# Patient Record
Sex: Female | Born: 1970
Health system: Southern US, Community
[De-identification: ages and names within clinical notes are randomized; demographics above are authoritative.]

## PROBLEM LIST (undated history)

## (undated) DIAGNOSIS — M5431 Sciatica, right side: Secondary | ICD-10-CM

## (undated) DIAGNOSIS — E785 Hyperlipidemia, unspecified: Secondary | ICD-10-CM

## (undated) DIAGNOSIS — R51 Headache: Secondary | ICD-10-CM

## (undated) DIAGNOSIS — R519 Headache, unspecified: Secondary | ICD-10-CM

## (undated) DIAGNOSIS — R7303 Prediabetes: Secondary | ICD-10-CM

## (undated) DIAGNOSIS — E119 Type 2 diabetes mellitus without complications: Secondary | ICD-10-CM

## (undated) DIAGNOSIS — G8929 Other chronic pain: Secondary | ICD-10-CM

## (undated) DIAGNOSIS — K635 Polyp of colon: Secondary | ICD-10-CM

## (undated) DIAGNOSIS — M1990 Unspecified osteoarthritis, unspecified site: Secondary | ICD-10-CM

## (undated) DIAGNOSIS — G629 Polyneuropathy, unspecified: Secondary | ICD-10-CM

## (undated) DIAGNOSIS — M47816 Spondylosis without myelopathy or radiculopathy, lumbar region: Secondary | ICD-10-CM

## (undated) HISTORY — PX: TUBAL LIGATION: SHX77

## (undated) HISTORY — DX: Unspecified osteoarthritis, unspecified site: M19.90

## (undated) HISTORY — DX: Prediabetes: R73.03

## (undated) HISTORY — DX: Spondylosis without myelopathy or radiculopathy, lumbar region: M47.816

## (undated) HISTORY — DX: Hyperlipidemia, unspecified: E78.5

## (undated) HISTORY — DX: Sciatica, right side: M54.31

## (undated) HISTORY — DX: Type 2 diabetes mellitus without complications: E11.9

## (undated) HISTORY — DX: Polyp of colon: K63.5

## (undated) HISTORY — DX: Other chronic pain: G89.29

## (undated) HISTORY — DX: Headache: R51

## (undated) HISTORY — DX: Polyneuropathy, unspecified: G62.9

## (undated) HISTORY — DX: Headache, unspecified: R51.9

---

## 2001-01-02 ENCOUNTER — Emergency Department (HOSPITAL_COMMUNITY): Admission: EM | Admit: 2001-01-02 | Discharge: 2001-01-02 | Payer: Self-pay

## 2001-06-01 ENCOUNTER — Encounter: Payer: Self-pay | Admitting: Emergency Medicine

## 2001-06-01 ENCOUNTER — Emergency Department (HOSPITAL_COMMUNITY): Admission: EM | Admit: 2001-06-01 | Discharge: 2001-06-01 | Payer: Self-pay | Admitting: Emergency Medicine

## 2001-08-23 ENCOUNTER — Other Ambulatory Visit: Admission: RE | Admit: 2001-08-23 | Discharge: 2001-08-23 | Payer: Self-pay | Admitting: Internal Medicine

## 2002-04-04 ENCOUNTER — Emergency Department (HOSPITAL_COMMUNITY): Admission: EM | Admit: 2002-04-04 | Discharge: 2002-04-04 | Payer: Self-pay | Admitting: Emergency Medicine

## 2005-10-18 ENCOUNTER — Emergency Department (HOSPITAL_COMMUNITY): Admission: EM | Admit: 2005-10-18 | Discharge: 2005-10-18 | Payer: Self-pay | Admitting: Emergency Medicine

## 2005-10-20 ENCOUNTER — Emergency Department (HOSPITAL_COMMUNITY): Admission: EM | Admit: 2005-10-20 | Discharge: 2005-10-20 | Payer: Self-pay | Admitting: Family Medicine

## 2006-09-03 ENCOUNTER — Emergency Department (HOSPITAL_COMMUNITY): Admission: EM | Admit: 2006-09-03 | Discharge: 2006-09-03 | Payer: Self-pay | Admitting: Family Medicine

## 2007-02-09 ENCOUNTER — Emergency Department (HOSPITAL_COMMUNITY): Admission: EM | Admit: 2007-02-09 | Discharge: 2007-02-09 | Payer: Self-pay | Admitting: Emergency Medicine

## 2010-05-19 ENCOUNTER — Emergency Department (HOSPITAL_COMMUNITY): Admission: EM | Admit: 2010-05-19 | Discharge: 2010-05-19 | Payer: Self-pay | Admitting: Family Medicine

## 2010-10-09 LAB — POCT URINALYSIS DIPSTICK
Bilirubin Urine: NEGATIVE
Glucose, UA: NEGATIVE mg/dL
Ketones, ur: NEGATIVE mg/dL
Nitrite: NEGATIVE
Protein, ur: NEGATIVE mg/dL
Specific Gravity, Urine: 1.015 (ref 1.005–1.030)
Urobilinogen, UA: 1 mg/dL (ref 0.0–1.0)
pH: 7 (ref 5.0–8.0)

## 2011-05-12 LAB — POCT RAPID STREP A: Streptococcus, Group A Screen (Direct): NEGATIVE

## 2011-06-28 ENCOUNTER — Emergency Department (HOSPITAL_COMMUNITY)
Admission: EM | Admit: 2011-06-28 | Discharge: 2011-06-28 | Disposition: A | Discharge status: Home / Self Care | Payer: Self-pay | Attending: Emergency Medicine | Admitting: Emergency Medicine

## 2011-06-28 ENCOUNTER — Emergency Department (HOSPITAL_COMMUNITY): Payer: Self-pay

## 2011-06-28 ENCOUNTER — Encounter: Payer: Self-pay | Admitting: *Deleted

## 2011-06-28 DIAGNOSIS — G8929 Other chronic pain: Secondary | ICD-10-CM | POA: Insufficient documentation

## 2011-06-28 DIAGNOSIS — M545 Low back pain, unspecified: Secondary | ICD-10-CM | POA: Insufficient documentation

## 2011-06-28 DIAGNOSIS — M549 Dorsalgia, unspecified: Secondary | ICD-10-CM | POA: Insufficient documentation

## 2011-06-28 DIAGNOSIS — M5430 Sciatica, unspecified side: Secondary | ICD-10-CM | POA: Insufficient documentation

## 2011-06-28 MED ORDER — LIDOCAINE HCL 2 % IJ SOLN
INTRAMUSCULAR | Status: AC
  Filled 2011-06-28: qty 1

## 2011-06-28 MED ORDER — HYDROMORPHONE HCL PF 2 MG/ML IJ SOLN
INTRAMUSCULAR | Status: AC
  Filled 2011-06-28: qty 1

## 2011-06-28 MED ORDER — DIAZEPAM 5 MG PO TABS
5.0000 mg | ORAL_TABLET | Freq: Once | ORAL | Status: AC
  Administered 2011-06-28: 5 mg via ORAL
  Filled 2011-06-28: qty 1

## 2011-06-28 MED ORDER — DIAZEPAM 5 MG PO TABS: 5.0000 mg | ORAL_TABLET | Freq: Two times a day (BID) | ORAL | Status: AC

## 2011-06-28 MED ORDER — IBUPROFEN 800 MG PO TABS: 800.0000 mg | ORAL_TABLET | Freq: Three times a day (TID) | ORAL | Status: DC

## 2011-06-28 MED ORDER — KETOROLAC TROMETHAMINE 60 MG/2ML IM SOLN
60.0000 mg | Freq: Once | INTRAMUSCULAR | Status: AC
  Administered 2011-06-28: 60 mg via INTRAMUSCULAR
  Filled 2011-06-28: qty 2

## 2011-06-28 MED ORDER — OXYCODONE-ACETAMINOPHEN 5-325 MG PO TABS
1.0000 | ORAL_TABLET | Freq: Once | ORAL | Status: AC
  Administered 2011-06-28: 1 via ORAL
  Filled 2011-06-28: qty 1

## 2011-06-28 MED ORDER — IBUPROFEN 800 MG PO TABS: 800.0000 mg | ORAL_TABLET | Freq: Three times a day (TID) | ORAL | Status: AC

## 2011-06-28 MED ORDER — OXYCODONE-ACETAMINOPHEN 5-325 MG PO TABS: 1.0000 | ORAL_TABLET | ORAL | Status: AC | PRN

## 2011-06-28 NOTE — ED Notes (Signed)
Pt states she is having sharp low back pain radiating down to legs, difficult to sleep

## 2011-06-28 NOTE — ED Provider Notes (Signed)
History     CSN: [id number] Arrival date & time: 06/28/2011 11:00 AM   First MD Initiated Contact with Patient 06/28/11 1154      Chief Complaint  Patient presents with  . Back Pain    (Consider location/radiation/quality/duration/timing/severity/associated sxs/prior treatment) HPI Comments: She's having sharp low back pain that radiates down her right leg.  Is causing difficulty sleeping.  Patient has had back pain in the past but not like this before.  Patient does not have an orthopedic or PCP to followup with.  She denies urinary retention, bowel or bladder incontinence, and numbness of her lower extremities.  Patient is able to ambulate however it does induce pain.  Patient has no other complaints.  Patient is a 40 y.o. female presenting with back pain. The history is provided by the patient.  Back Pain  Pertinent negatives include no chest pain, no fever, no numbness, no headaches, no abdominal pain and no weakness.    History reviewed. No pertinent past medical history.  Past Surgical History  Procedure Date  . Tubal ligation     No family history on file.  History  Substance Use Topics  . Smoking status: Current Everyday Smoker  . Smokeless tobacco: Not on file  . Alcohol Use: No    OB History    Grav Para Term Preterm Abortions TAB SAB Ect Mult Living                  Review of Systems  Constitutional: Positive for activity change. Negative for fever, chills, fatigue and unexpected weight change.  HENT: Negative for neck pain and neck stiffness.   Eyes: Negative for visual disturbance.  Respiratory: Negative for shortness of breath.   Cardiovascular: Negative for chest pain and leg swelling.  Gastrointestinal: Negative for nausea, abdominal pain, constipation and rectal pain.  Genitourinary: Negative for urgency and difficulty urinating.       Patient denies bowel and bladder incontinence.  Musculoskeletal: Positive for back pain and gait problem. Negative  for myalgias, joint swelling and arthralgias.  Neurological: Negative for weakness, numbness and headaches.  All other systems reviewed and are negative.    Allergies  Review of patient's allergies indicates no known allergies.  Home Medications   Current Outpatient Rx  Name Route Sig Dispense Refill  . DIPHENHYDRAMINE-ACETAMINOPHEN 12.5-500 MG PO TABS Oral Take 1 tablet by mouth at bedtime as needed. For sleep     . OXYCODONE-ACETAMINOPHEN 7.5-325 MG PO TABS Oral Take 1 tablet by mouth every 4 (four) hours as needed. For pain       BP 134/96  Pulse 64  Temp(Src) 97.8 F (36.6 C) (Oral)  Resp 18  SpO2 100%  Physical Exam  Constitutional: She is oriented to person, place, and time. She appears well-developed and well-nourished. No distress.  HENT:  Head: Normocephalic and atraumatic.  Eyes: Conjunctivae and EOM are normal. Pupils are equal, round, and reactive to light. No scleral icterus.  Neck: Normal range of motion and full passive range of motion without pain. Neck supple. No tracheal tenderness, no spinous process tenderness and no muscular tenderness present. Carotid bruit is not present. No Brudzinski's sign noted. No mass and no thyromegaly present.  Cardiovascular: Normal rate, regular rhythm and intact distal pulses.  Exam reveals no gallop and no friction rub.   No murmur heard. Pulmonary/Chest: Effort normal and breath sounds normal. No stridor. No respiratory distress. She has no wheezes. She has no rales. She exhibits no tenderness.  Abdominal:  Soft. Bowel sounds are normal.  Musculoskeletal:       Cervical back: She exhibits normal range of motion, no tenderness, no bony tenderness and no pain.       Thoracic back: She exhibits no tenderness, no bony tenderness and no pain.       Lumbar back: She exhibits tenderness, bony tenderness and pain. She exhibits no spasm and normal pulse.       Right foot: She exhibits no swelling.       Left foot: She exhibits no  swelling.       Pt has increased pain w ROM of lumbar spine. Pain w ambulation.   Neurological: She is alert and oriented to person, place, and time. She has normal strength and normal reflexes. No cranial nerve deficit or sensory deficit.  Skin: Skin is warm and dry. No rash noted. She is not diaphoretic. No erythema. No pallor.  Psychiatric: She has a normal mood and affect.    ED Course  Procedures (including critical care time)  Labs Reviewed - No data to display No results found.   No diagnosis found. X-ray results discussed with the patient.  Pain was improved with Toradol and Valium.  Patient will be discharged with instructions for followup with orthopedics if symptoms progress or worsen and to be given pain management.   MDM  Sciatica Chronic Back pain         [place], [place] 06/28/11 1526

## 2011-06-28 NOTE — ED Provider Notes (Signed)
Medical screening examination/treatment/procedure(s) were performed by non-physician practitioner and as supervising physician I was immediately available for consultation/collaboration.  Christiann Hagerty P Catherina Pates, MD 06/28/11 1544 

## 2014-05-03 ENCOUNTER — Ambulatory Visit (INDEPENDENT_AMBULATORY_CARE_PROVIDER_SITE_OTHER): Payer: Self-pay | Admitting: Internal Medicine

## 2014-05-03 ENCOUNTER — Ambulatory Visit: Payer: Self-pay

## 2014-05-03 ENCOUNTER — Encounter: Payer: Self-pay | Admitting: Internal Medicine

## 2014-05-03 VITALS — BP 143/87 | HR 61 | Temp 98.0°F | Ht 69.0 in | Wt 227.2 lb

## 2014-05-03 DIAGNOSIS — Z720 Tobacco use: Secondary | ICD-10-CM

## 2014-05-03 DIAGNOSIS — Z23 Encounter for immunization: Secondary | ICD-10-CM

## 2014-05-03 DIAGNOSIS — R030 Elevated blood-pressure reading, without diagnosis of hypertension: Secondary | ICD-10-CM

## 2014-05-03 DIAGNOSIS — R51 Headache: Secondary | ICD-10-CM

## 2014-05-03 DIAGNOSIS — G8929 Other chronic pain: Secondary | ICD-10-CM

## 2014-05-03 DIAGNOSIS — Z1239 Encounter for other screening for malignant neoplasm of breast: Secondary | ICD-10-CM

## 2014-05-03 DIAGNOSIS — Z0000 Encounter for general adult medical examination without abnormal findings: Secondary | ICD-10-CM

## 2014-05-03 DIAGNOSIS — IMO0001 Reserved for inherently not codable concepts without codable children: Secondary | ICD-10-CM

## 2014-05-03 DIAGNOSIS — I10 Essential (primary) hypertension: Secondary | ICD-10-CM

## 2014-05-03 DIAGNOSIS — M47816 Spondylosis without myelopathy or radiculopathy, lumbar region: Secondary | ICD-10-CM

## 2014-05-03 LAB — COMPLETE METABOLIC PANEL WITH GFR
ALBUMIN: 4.3 g/dL (ref 3.5–5.2)
ALT: 12 U/L (ref 0–35)
AST: 12 U/L (ref 0–37)
Alkaline Phosphatase: 64 U/L (ref 39–117)
BUN: 5 mg/dL — AB (ref 6–23)
CALCIUM: 9.3 mg/dL (ref 8.4–10.5)
CHLORIDE: 108 meq/L (ref 96–112)
CO2: 23 mEq/L (ref 19–32)
CREATININE: 0.69 mg/dL (ref 0.50–1.10)
GLUCOSE: 102 mg/dL — AB (ref 70–99)
POTASSIUM: 4.2 meq/L (ref 3.5–5.3)
Sodium: 139 mEq/L (ref 135–145)
Total Bilirubin: 0.8 mg/dL (ref 0.2–1.2)
Total Protein: 6.7 g/dL (ref 6.0–8.3)

## 2014-05-03 LAB — CBC WITH DIFFERENTIAL/PLATELET
BASOS ABS: 0.1 10*3/uL (ref 0.0–0.1)
Basophils Relative: 1 % (ref 0–1)
EOS PCT: 2 % (ref 0–5)
Eosinophils Absolute: 0.2 10*3/uL (ref 0.0–0.7)
HCT: 37 % (ref 36.0–46.0)
Hemoglobin: 12.3 g/dL (ref 12.0–15.0)
LYMPHS ABS: 3.7 10*3/uL (ref 0.7–4.0)
LYMPHS PCT: 39 % (ref 12–46)
MCH: 30.4 pg (ref 26.0–34.0)
MCHC: 33.2 g/dL (ref 30.0–36.0)
MCV: 91.4 fL (ref 78.0–100.0)
Monocytes Absolute: 0.5 10*3/uL (ref 0.1–1.0)
Monocytes Relative: 5 % (ref 3–12)
NEUTROS ABS: 5 10*3/uL (ref 1.7–7.7)
NEUTROS PCT: 53 % (ref 43–77)
PLATELETS: 385 10*3/uL (ref 150–400)
RBC: 4.05 MIL/uL (ref 3.87–5.11)
RDW: 14.2 % (ref 11.5–15.5)
WBC: 9.5 10*3/uL (ref 4.0–10.5)

## 2014-05-03 NOTE — Patient Instructions (Signed)
-  Will check your blood work today -Please schedule a follow-up appt for pap smear  -Will order a mammogram and call you with appt date and time -Pleasure meeting you!

## 2014-05-04 ENCOUNTER — Encounter: Payer: Self-pay | Admitting: Internal Medicine

## 2014-05-04 DIAGNOSIS — G43009 Migraine without aura, not intractable, without status migrainosus: Secondary | ICD-10-CM | POA: Insufficient documentation

## 2014-05-04 DIAGNOSIS — F17201 Nicotine dependence, unspecified, in remission: Secondary | ICD-10-CM | POA: Insufficient documentation

## 2014-05-04 DIAGNOSIS — R030 Elevated blood-pressure reading, without diagnosis of hypertension: Secondary | ICD-10-CM

## 2014-05-04 DIAGNOSIS — IMO0001 Reserved for inherently not codable concepts without codable children: Secondary | ICD-10-CM | POA: Insufficient documentation

## 2014-05-04 DIAGNOSIS — Z720 Tobacco use: Secondary | ICD-10-CM | POA: Insufficient documentation

## 2014-05-04 DIAGNOSIS — Z0000 Encounter for general adult medical examination without abnormal findings: Secondary | ICD-10-CM | POA: Insufficient documentation

## 2014-05-04 DIAGNOSIS — M5431 Sciatica, right side: Secondary | ICD-10-CM | POA: Insufficient documentation

## 2014-05-04 DIAGNOSIS — M47816 Spondylosis without myelopathy or radiculopathy, lumbar region: Secondary | ICD-10-CM | POA: Insufficient documentation

## 2014-05-04 HISTORY — DX: Sciatica, right side: M54.31

## 2014-05-04 MED ORDER — ACETAMINOPHEN 325 MG PO TABS: 650.0000 mg | ORAL_TABLET | Freq: Four times a day (QID) | ORAL | Status: DC | PRN

## 2014-05-04 NOTE — Assessment & Plan Note (Addendum)
Assessment: Pt with frequent bilaterally located headaches with no disabling symptoms most likely due to chronic tension type headache.    Plan:  -Pt instructed to take OTC acetaminophen, NSAID's, or aspirin for pain -Obtain CMP and CBC w/ diff ---> normal -Continue to monitor for alarm symptoms

## 2014-05-04 NOTE — Progress Notes (Signed)
Patient ID: Maureen Reyes, female   DOB: May 30, 1971, 43 y.o.   MRN: [id number]    Subjective:   Patient ID: Maureen Reyes female   DOB: 1971-03-23 43 y.o.   MRN: [id number]  HPI: [name] is a 43 y.o. pleasant woman with past medical history of chronic low back pain due to lumbar facet arthropathy and right sided sciatica, chronic headaches, and tobacco use who presents to establish care to our clinic.   She reports having chronic low back pain after a television fell on her lower back approximately 3 years ago. She was seen in the ED at that time and xray imaging revealed lower lumbar facet arthropathy. She reports chronic weakness in both legs with paraesthesias of her right LE and was diagnosed with right sided sciatica. She denies bladder/bowel incontinence or weakness worse than baseline. She denies recent fall, injury, or trauma. She takes tylenol as needed for pain which is not routinely.    She has history of chronic bilateral located headaches for years that have been occuring recently weekly and will last a few hours to days. She describes them as bandlike located across her forehead with occasional throbbing.  She reports having a knot like area on the left side of her head that has been present for years but recently been more tender to touch. She reports mild photophobia but denies aura, vision change from baseline chronic blurry vision, nausea/vomiting, or disabling symptoms. She denies personal or family history of migraine headaches.  She reports she follows a healthy diet and tries to exercise regularly. There is family history of Type II DM in her parents and grandparents. She has chronic blurry vision for which she wears glasses for. She denies polyuria, polyphagia, or polyuria.   She has never had a screening mammography and is overdue for a pap smear.  She smokes 0.5 pack per day and would like to quit. She has occasional dyspnea and dry cough but denies wheezing.  She has never had pulmonary function testing in the past and is not on any inhalers.       Past Medical History  Diagnosis Date  . Lumbar facet arthropathy   . Chronic headaches   . Sciatica of right side    No current outpatient prescriptions on file.   No current facility-administered medications for this visit.   Family History  Problem Relation Age of Onset  . Diabetes Mellitus II Mother   . Diabetes Mellitus II Father    History   Social History  . Marital Status: Single    Spouse Name: N/A    Number of Children: N/A  . Years of Education: N/A   Social History Main Topics  . Smoking status: Current Every Day Smoker -- 20 years  . Smokeless tobacco: None     Comment: 1/2 PPD  . Alcohol Use: No  . Drug Use: No  . Sexual Activity: None   Other Topics Concern  . None   Social History Narrative  . None   Review of Systems: Review of Systems  Constitutional: Negative for fever and chills.  HENT: Negative for congestion and sore throat.   Eyes: Positive for blurred vision (chronic, wears glasses).  Respiratory: Positive for cough and shortness of breath (occasional). Negative for wheezing.   Cardiovascular: Negative for chest pain, palpitations, orthopnea and leg swelling.  Gastrointestinal: Negative for nausea, vomiting, abdominal pain, diarrhea, constipation and blood in stool.  Genitourinary: Negative for dysuria, urgency, frequency and hematuria.  Musculoskeletal: Positive for back pain (chronic low back ). Negative for falls, joint pain, myalgias and neck pain.  Skin: Negative for rash.  Neurological: Positive for sensory change (chronic in right LE), focal weakness (chronic in b/l LE) and headaches (chronic, tension type).  Endo/Heme/Allergies: Negative for polydipsia.  Psychiatric/Behavioral: Positive for substance abuse (tobacco).    Objective:  Physical Exam: Filed Vitals:   05/03/14 1111  BP: 143/87  Pulse: 61  Temp: 98 F (36.7 C)  TempSrc:  Oral  Height: 5\' 9"  (1.753 m)  Weight: 227 lb 3.2 oz (103.057 kg)  SpO2: 100%    Physical Exam  Constitutional: She is oriented to person, place, and time. She appears well-developed and well-nourished. No distress.  HENT:  Head: Normocephalic and atraumatic.  Right Ear: External ear normal.  Left Ear: External ear normal.  Nose: Nose normal.  Mouth/Throat: Oropharynx is clear and moist. No oropharyngeal exudate.  Eyes: Conjunctivae and EOM are normal. Pupils are equal, round, and reactive to light. Right eye exhibits no discharge. Left eye exhibits no discharge. No scleral icterus.  Neck: Normal range of motion. Neck supple.  Cardiovascular: Normal rate, regular rhythm and normal heart sounds.   Pulmonary/Chest: Effort normal and breath sounds normal. No respiratory distress. She has no wheezes. She has no rales.  Abdominal: Soft. Bowel sounds are normal. She exhibits no distension. There is no tenderness. There is no rebound and no guarding.  Musculoskeletal: Normal range of motion. She exhibits no edema and no tenderness.  Neurological: She is alert and oriented to person, place, and time.  Normal 5/5 muscle strength throughout. Decreased sensation of light touch of right LE. Straight leg test negative bilaterally.   Skin: Skin is warm and dry. No rash noted. She is not diaphoretic. No erythema. No pallor.  Psychiatric: She has a normal mood and affect. Her behavior is normal. Judgment and thought content normal.    Assessment & Plan:   Please see problem list for problem-based assessment and plan

## 2014-05-04 NOTE — Assessment & Plan Note (Addendum)
Assessment: Pt with chronic low back pain with evidence of lumbar facet arthropathy on xray imaging in 2012 and right sided sciatica who presents with no alarm symptoms.   Plan:  -Consider referral to sports medicine at next visit for further management and workup including possible imaging (MRI), physical therapy, and corticosteroid injection therapy  -Pt instructed to take OTC acetaminophen 650 mg Q 6 hr PRN pain

## 2014-05-04 NOTE — Assessment & Plan Note (Addendum)
Assessment: Pt is 0.5 ppd cigarette smoker for past 20 years who presents with plans to quit smoking.  Plan:  -Pt aware of tobacco cessation resources and counseled on cessation -Monitor cough, if chronic consider chest xray and pulmonary function testing

## 2014-05-04 NOTE — Assessment & Plan Note (Signed)
-  Pt received annual influenza vaccination today on 05/03/14.  -Order placed for screening mammography due to age>40.  -Pt to have pap smear testing at next visit.

## 2014-05-04 NOTE — Assessment & Plan Note (Signed)
Assessment: Pt with no past history of hypertension who presents with 1st recording of elevated blood pressure of 143/87.   Plan:  -BP 143/87 near goal <140/90 -Continue to monitor, if continues to be elevated consider starting HCTZ 12.5 mg daily -Obtain CMP ---> normal

## 2014-05-09 NOTE — Progress Notes (Signed)
INTERNAL MEDICINE TEACHING ATTENDING ADDENDUM - Chessie Neuharth, MD: I reviewed and discussed at the time of visit with the resident Dr. Rabbani, the patient's medical history, physical examination, diagnosis and results of pertinent tests and treatment and I agree with the patient's care as documented.  

## 2014-05-11 ENCOUNTER — Ambulatory Visit: Payer: Self-pay

## 2014-05-15 ENCOUNTER — Ambulatory Visit (INDEPENDENT_AMBULATORY_CARE_PROVIDER_SITE_OTHER): Payer: Self-pay | Admitting: Internal Medicine

## 2014-05-15 ENCOUNTER — Encounter: Payer: Self-pay | Admitting: Internal Medicine

## 2014-05-15 VITALS — BP 139/89 | HR 52 | Temp 97.7°F | Ht 69.0 in | Wt 228.3 lb

## 2014-05-15 DIAGNOSIS — R030 Elevated blood-pressure reading, without diagnosis of hypertension: Secondary | ICD-10-CM

## 2014-05-15 DIAGNOSIS — Z124 Encounter for screening for malignant neoplasm of cervix: Secondary | ICD-10-CM

## 2014-05-15 DIAGNOSIS — IMO0001 Reserved for inherently not codable concepts without codable children: Secondary | ICD-10-CM

## 2014-05-15 DIAGNOSIS — N889 Noninflammatory disorder of cervix uteri, unspecified: Secondary | ICD-10-CM

## 2014-05-15 NOTE — Progress Notes (Signed)
   Subjective:   Patient ID: Maureen Reyes female   DOB: 07-18-1971 43 y.o.   MRN: [id number]  HPI: [name] is a 43 y.o. female presenting to opc today for a pap smear.  She reports the last one was several years ago, maybe at [facility] hospital. No report on EPIC that I could locate at this time. Denies history of abnormal pap smears or immediate family history of breat or cervical cancer.  She is still sexually active but denies any pain or bleeding during intercourse, no abnormal vaginal discharge, hematuria, abdominal pain. LMP last month 04/21/14.    Elevated BP: initially 156/91 but improved to 139/89 on repeat.   Past Medical History  Diagnosis Date  . Lumbar facet arthropathy   . Chronic headaches   . Sciatica of right side    Current Outpatient Prescriptions  Medication Sig Dispense Refill  . acetaminophen (TYLENOL) 325 MG tablet Take 2 tablets (650 mg total) by mouth every 6 (six) hours as needed.       No current facility-administered medications for this visit.   Family History  Problem Relation Age of Onset  . Diabetes Mellitus II Mother   . Diabetes Mellitus II Father    History   Social History  . Marital Status: Single    Spouse Name: N/A    Number of Children: N/A  . Years of Education: N/A   Social History Main Topics  . Smoking status: Current Every Day Smoker -- 20 years  . Smokeless tobacco: None     Comment: 1/2 PPD  . Alcohol Use: No  . Drug Use: No  . Sexual Activity: None   Other Topics Concern  . None   Social History Narrative  . None   Review of Systems:  Constitutional:  Denies fever, chills  Respiratory:  Denies SOB  Cardiovascular:  Denies chest pain  Gastrointestinal:  Denies nausea, vomiting, abdominal pain  Genitourinary:  Denies dysuria, hematuria  Skin:  Denies pallor, rash and wound.   Neurological:  Headaches.    Objective:  Physical Exam: Filed Vitals:   05/15/14 0953 05/15/14 1429  BP: 156/91 139/89    Pulse: 45 52  Temp: 97.7 F (36.5 C)   TempSrc: Oral   Height: 5\' 9"  (1.753 m)   Weight: 228 lb 4.8 oz (103.556 kg)   SpO2: 100%    Vitals reviewed. General: lying on examination table, NAD HEENT: EOMI Cardiac: RRR Pulm: clear to auscultation bilaterally Abd: soft, nontender, BS present GYN: white plaque noted on cervix, not scrapeable, bleeding after cervical scraping noted, no cervical motion tenderness Ext: moving all 4 extremities Neuro: alert and oriented X3  Assessment & Plan:  Discussed with Dr. Si Gaul done today--GYN referral, awaiting cytology and HPV results

## 2014-05-15 NOTE — Assessment & Plan Note (Signed)
Elevated again today initially to SBP 156 but improved on repeat to 139/89.    -recommended life style modifications, decrease salt intake, adjust diet, and increase exercise -recheck in one month and consider treatment if remains elevated with formal diagnosis

## 2014-05-15 NOTE — Patient Instructions (Addendum)
Please get your mammogram done and we will also let you know of the results of your pap smear done today  Please cut back on the salt in your diet and PLEASE stop smoking if possible. Consider calling the quit line 1800quit now  General Instructions:  Please bring your medicines with you each time you come to clinic.  Medicines may include prescription medications, over-the-counter medications, herbal remedies, eye drops, vitamins, or other pills.  Progress Toward Treatment Goals:  No flowsheet data found.  Self Care Goals & Plans:  Self Care Goal 05/15/2014  Manage my medications take my medicines as prescribed; bring my medications to every visit; refill my medications on time  Eat healthy foods eat more vegetables; eat foods that are low in salt; eat baked foods instead of fried foods  Be physically active -  Stop smoking (No Data)    No flowsheet data found.   Care Management & Community Referrals:  No flowsheet data found.

## 2014-05-15 NOTE — Assessment & Plan Note (Signed)
Annual screening. No prior records on epic but claims last one at least 2 years ago maybe at [facility] hospital.    White plaque noted on cervix on examination with bleeding on scraping.   -awaiting cytology with HPV -GYN referral given examination findings

## 2014-05-16 LAB — CYTOLOGY - PAP

## 2014-05-16 NOTE — Progress Notes (Signed)
Case discussed with Dr. Eula Fried soon after the resident saw the patient. We reviewed the resident's history and exam and pertinent patient test results. I agree with the assessment, diagnosis, and plan of care documented in the resident's note.  Given cervical abnormality found on physical examination we will refer to [facility] for assessment.

## 2014-05-17 ENCOUNTER — Encounter: Payer: Self-pay | Admitting: Obstetrics and Gynecology

## 2014-05-17 ENCOUNTER — Telehealth: Payer: Self-pay | Admitting: Internal Medicine

## 2014-05-17 MED ORDER — METRONIDAZOLE 500 MG PO TABS: 2000.0000 mg | ORAL_TABLET | Freq: Once | ORAL | Status: DC

## 2014-05-17 MED ORDER — METRONIDAZOLE 500 MG PO TABS: 500.0000 mg | ORAL_TABLET | Freq: Two times a day (BID) | ORAL | Status: DC

## 2014-05-17 NOTE — Telephone Encounter (Signed)
I called Ms. Porath to go over [employer].   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 HPV can sometimes be negative in setting of hyperkeratosis.  Would recommend repeat papsmear in 6 months.  She does not have orange card right now so may not be able to be referred to GYN quickly as originally referral was placed.  She is advised to get orange card and let us know when she does so we can process referrals. She will need to return in 6 months for papsmear.    I have also informed her of trichomonas positive on papsmear.  She will pick up her one time 2g dose of flagyl from pharmacy. She is sexually active and I have also informed her that partners need to be treated.   She voices understanding of results.   I will forward this note to pcp to let her know as well.   Signed: Jerene Pitch, MD PGY-3, Internal Medicine Resident Pager: [phone]  05/17/2014,6:40 PM

## 2014-06-06 ENCOUNTER — Encounter: Payer: Self-pay | Admitting: *Deleted

## 2014-06-13 ENCOUNTER — Ambulatory Visit (INDEPENDENT_AMBULATORY_CARE_PROVIDER_SITE_OTHER): Payer: Self-pay | Admitting: Internal Medicine

## 2014-06-13 ENCOUNTER — Encounter: Payer: Self-pay | Admitting: Internal Medicine

## 2014-06-13 VITALS — BP 124/78 | HR 66 | Temp 98.3°F | Ht 69.0 in | Wt 226.7 lb

## 2014-06-13 DIAGNOSIS — IMO0001 Reserved for inherently not codable concepts without codable children: Secondary | ICD-10-CM

## 2014-06-13 DIAGNOSIS — A5901 Trichomonal vulvovaginitis: Secondary | ICD-10-CM

## 2014-06-13 DIAGNOSIS — G43009 Migraine without aura, not intractable, without status migrainosus: Secondary | ICD-10-CM

## 2014-06-13 DIAGNOSIS — H9212 Otorrhea, left ear: Secondary | ICD-10-CM

## 2014-06-13 DIAGNOSIS — R030 Elevated blood-pressure reading, without diagnosis of hypertension: Secondary | ICD-10-CM

## 2014-06-13 NOTE — Assessment & Plan Note (Signed)
Pt reports she had yellow, pus-like drainage from her left ear 2 weeks ago that lasted 4-5 days, now resolved. She denies any pain. On exam, ear canal and tympanic membrane appear normal. No masses noted that would be concerning for tumor. She reports she gets eczema on her hands sometimes. Most likely drainage was due to eczema of ear. Patient recommended to continue to monitor for symptoms. No treatment necessary at this time since symptoms resolved.

## 2014-06-13 NOTE — Assessment & Plan Note (Signed)
Pt reports more frequent headaches in the last 6 months. Some symptoms concerning for migraines (worse near time of menstrual cycle, photophobia occasionally, lasting >4 hours) vs. Tension headache. Recommended to continue ibuprofen or tylenol for now since most of her headaches are relieved by this. Continue to monitor for alarm symptoms.

## 2014-06-13 NOTE — Assessment & Plan Note (Signed)
BP 124/78 today. Will continue to monitor BP at future visits. Medication not necessary at this time.

## 2014-06-13 NOTE — Patient Instructions (Signed)
It was a pleasure meeting you, Maureen Reyes.  Please confirm with your partner that he was treated. If he was not treated, please call the clinic and I will prescribe both of you Flagyl. If you become symptomatic, please call and make an appointment in the clinic.   If your headaches worsen and cannot be controlled with ibuprofen or Tylenol, please return to the clinic.   General Instructions:   Please bring your medicines with you each time you come to clinic.  Medicines may include prescription medications, over-the-counter medications, herbal remedies, eye drops, vitamins, or other pills.   Progress Toward Treatment Goals:  No flowsheet data found.  Self Care Goals & Plans:  Self Care Goal 05/15/2014  Manage my medications take my medicines as prescribed; bring my medications to every visit; refill my medications on time  Eat healthy foods eat more vegetables; eat foods that are low in salt; eat baked foods instead of fried foods  Be physically active -  Stop smoking (No Data)    No flowsheet data found.   Care Management & Community Referrals:  No flowsheet data found.

## 2014-06-13 NOTE — Assessment & Plan Note (Signed)
Pt with recently treated trichomonas with Flagyl. Pt reports her partner told her that he was treated but she is unsure whether he truly sought medical attention. I recommended for her to confirm with her partner if he was treated or not. If he was treated, then neither need further treatment. If he was not treated, I recommended for her to call the clinic so that I can give her a prescription for both her and her partner to treat the Trichomonas infection. If she starts to develop symptoms, I recommended for her to make an appointment in the clinic to be evaluated and determine if other STIs need to be screened. Patient understands.

## 2014-06-13 NOTE — Progress Notes (Signed)
   Subjective:    Patient ID: Maureen Reyes, female    DOB: 1971-04-20, 43 y.o.   MRN: [id number]  HPI Maureen Reyes is a 43yo woman with PMHx of tobacco abuse and chronic tension headache who presents today for the following:  1. Follow up of Trichomonas infection: Patient reports she completed Flagyl treatment as prescribed at her visit on 05/13/14. She states her partner also told her that he completed treatment for Trichomonas as well. She states she feels unsure if whether he truly sought medical attention and did not witness him take antibiotics. She denies any symptoms at this time, including vaginal discharge, itching, or abnormal bleeding. Patient was recommended to follow up at [facility] hospital since bleeding was seen after cervical scraping on exam. Patient unable to follow up at [facility] because of insurance issues.   2. Left Ear Drainage: Patient reports 2 weeks ago she had yellow, pus-like drainage from her left ear. She states the drainage occurred for 4-5 days and then subsided. She reports associated itching, but denies pain. She denies swimming recently or cleaning out her ears.   3. Headaches: Patient reports that 6 months ago she started to have headaches more frequently. She states she never used to get headaches. She reports getting headaches 3-4 times per week, which last most of the day. She notes the pain is achy, located in her temples bilaterally and sometimes across the front of her forehead, non-disabling, and usually alleviated with Tylenol or ibuprofen. She notes light sensitivity occasionally, but states her headaches are worse near the time of her menstrual periods. She denies tingling/numbness in her extremities, changes in vision.   4. BP: Pt has had elevated blood pressures at past visits. Her BP today is 124/78. She is not on any BP medications.    Review of Systems General: Denies fever, chills, night sweats, changes in weight, changes in appetite HEENT: Denies  rhinorrhea, sore throat CV: Denies CP, palpitations, SOB, orthopnea Pulm: Denies SOB, cough, wheezing GI: Denies abdominal pain, nausea, vomiting, diarrhea, constipation, melena, hematochezia GU: Denies dysuria, hematuria, frequency Msk: Denies muscle cramps, joint pains Neuro: Denies weakness Skin: Denies rashes, bruising    Objective:   Physical Exam General: alert, sitting up in chair, NAD HEENT: [place]/AT, EOMI, PERRL, sclera anicteric. Ear canals and tympanic membranes appear normal bilaterally, no masses or vesicles observed. Pharynx non-erythematous, mucus membranes moist Neck: supple, no JVD, no lymphadenopathy CV: RRR, normal S1/S2, no m/g/r Pulm: CTA bilaterally, breaths non-labored, no wheezing Abd: BS+, soft, non-distended, non-tender Ext: warm, no edema, moves all Neuro: alert and oriented x 3, CNs II-XII intact, strength 5/5 in upper and lower extremities bilaterally     Assessment & Plan:

## 2014-06-14 ENCOUNTER — Telehealth: Payer: Self-pay | Admitting: *Deleted

## 2014-06-14 NOTE — Progress Notes (Signed)
I saw and evaluated the patient.  I personally confirmed the key portions of Dr. Shela Commons history and exam and reviewed pertinent patient test results.  The assessment, diagnosis, and plan were formulated together and I agree with the documentation in the resident's note.

## 2014-06-14 NOTE — Telephone Encounter (Signed)
Pt called needs Flagyl Rx for two sent to [facility]. States partner did not get checked. You saw pt 06/13/14. Hilda Blades Tanaja Ganger RN 06/14/14 3PM

## 2014-06-15 ENCOUNTER — Telehealth: Payer: Self-pay | Admitting: Internal Medicine

## 2014-06-15 ENCOUNTER — Other Ambulatory Visit: Payer: Self-pay | Admitting: Internal Medicine

## 2014-06-15 DIAGNOSIS — R51 Headache: Secondary | ICD-10-CM

## 2014-06-15 DIAGNOSIS — M47816 Spondylosis without myelopathy or radiculopathy, lumbar region: Secondary | ICD-10-CM

## 2014-06-15 DIAGNOSIS — A5901 Trichomonal vulvovaginitis: Secondary | ICD-10-CM

## 2014-06-15 DIAGNOSIS — R519 Headache, unspecified: Secondary | ICD-10-CM

## 2014-06-15 DIAGNOSIS — Z1231 Encounter for screening mammogram for malignant neoplasm of breast: Secondary | ICD-10-CM

## 2014-06-15 MED ORDER — METRONIDAZOLE 500 MG PO TABS: 2000.0000 mg | ORAL_TABLET | Freq: Once | ORAL | Status: AC

## 2014-06-15 NOTE — Telephone Encounter (Signed)
Left message on patient's voicemail that I will give prescription for Flagyl with 1 refill so that both her and her partner can be treated.

## 2014-06-28 ENCOUNTER — Encounter: Payer: Self-pay | Admitting: Obstetrics and Gynecology

## 2014-07-03 ENCOUNTER — Ambulatory Visit (HOSPITAL_COMMUNITY)
Admission: RE | Admit: 2014-07-03 | Discharge: 2014-07-03 | Disposition: A | Discharge status: Home / Self Care | Payer: Self-pay | Source: Ambulatory Visit | Attending: Internal Medicine | Admitting: Internal Medicine

## 2014-07-03 DIAGNOSIS — Z1231 Encounter for screening mammogram for malignant neoplasm of breast: Secondary | ICD-10-CM

## 2014-07-04 ENCOUNTER — Other Ambulatory Visit: Payer: Self-pay | Admitting: Internal Medicine

## 2014-07-04 DIAGNOSIS — R928 Other abnormal and inconclusive findings on diagnostic imaging of breast: Secondary | ICD-10-CM

## 2014-07-04 NOTE — Telephone Encounter (Signed)
Dr Arcelia Jew talked with pt 06/15/14.

## 2014-07-19 ENCOUNTER — Ambulatory Visit (HOSPITAL_COMMUNITY)
Admission: RE | Admit: 2014-07-19 | Discharge: 2014-07-19 | Disposition: A | Discharge status: Home / Self Care | Payer: Self-pay | Source: Ambulatory Visit | Attending: Obstetrics and Gynecology | Admitting: Obstetrics and Gynecology

## 2014-07-19 ENCOUNTER — Encounter (HOSPITAL_COMMUNITY): Payer: Self-pay

## 2014-07-19 VITALS — BP 124/86 | Temp 98.3°F | Ht 69.0 in | Wt 226.8 lb

## 2014-07-19 DIAGNOSIS — Z1239 Encounter for other screening for malignant neoplasm of breast: Secondary | ICD-10-CM

## 2014-07-19 NOTE — Patient Instructions (Addendum)
Explained to Maureen Reyes that she did not need a Pap smear today due to last Pap smear was 05/15/2014. Due to recommendation from Internal Medicine for a repeat Pap smear in 6 months let her know she can come back to [facility] and to call Sabrina to schedule. Let patient know the [facility]  will follow up with her within the next couple weeks with results by letter or phone. Smoking cessation discussed with patient and resources given. Maureen Reyes verbalized understanding. Patient escorted to mammography for a screening mammogram.  [place]ann[place], Arvil Chaco, RN 4:22 PM

## 2014-07-19 NOTE — Addendum Note (Signed)
Encounter addended by: Loletta Parish, RN on: 07/19/2014  4:29 PM<BR>     Documentation filed: Notes Section

## 2014-07-19 NOTE — Progress Notes (Addendum)
No complaints today.  Pap Smear:  Pap smear not completed today. Last Pap smear was 05/15/2014 at [facility] and negative with cellular changes consistent with hyperkeratosis negative HPV. Per patient has no history of an abnormal Pap smear. Last Pap smear result is in EPIC.  Physical exam: Breasts Breasts symmetrical. No skin abnormalities bilateral breasts. No nipple retraction bilateral breasts. No nipple discharge bilateral breasts. No lymphadenopathy. No lumps palpated bilateral breasts. No complaints of pain or tenderness on exam. Patient escorted to mammography for a screening mammogram.        Pelvic/Bimanual No Pap smear completed today since last Pap smear was 05/15/2014. Pap smear not indicated per BCCCP guidelines.   Smoking cessation discussed with patient. Referred patient to the [facility] Quitline and gave resources to free smoking cessation classes offered at the [facility].

## 2014-07-25 ENCOUNTER — Ambulatory Visit
Admission: RE | Admit: 2014-07-25 | Discharge: 2014-07-25 | Disposition: A | Discharge status: Home / Self Care | Payer: No Typology Code available for payment source | Source: Ambulatory Visit | Attending: Internal Medicine | Admitting: Internal Medicine

## 2014-07-25 DIAGNOSIS — R928 Other abnormal and inconclusive findings on diagnostic imaging of breast: Secondary | ICD-10-CM

## 2014-10-02 ENCOUNTER — Encounter: Payer: Self-pay | Admitting: *Deleted

## 2014-10-10 ENCOUNTER — Telehealth (HOSPITAL_COMMUNITY): Payer: Self-pay | Admitting: *Deleted

## 2014-10-10 NOTE — Telephone Encounter (Signed)
Called patient to schedule follow-up Pap smear. Appointment scheduled for Thursday, November 16, 2014 at 0800. Let patient know she will need to come to [facility]. Patient verbalized understanding.

## 2014-11-15 ENCOUNTER — Telehealth (HOSPITAL_COMMUNITY): Payer: Self-pay | Admitting: *Deleted

## 2014-11-15 NOTE — Telephone Encounter (Signed)
Telephoned patient at home # and # was to a fax machine

## 2014-11-16 ENCOUNTER — Ambulatory Visit (HOSPITAL_COMMUNITY)
Admission: RE | Admit: 2014-11-16 | Discharge: 2014-11-16 | Disposition: A | Discharge status: Home / Self Care | Payer: No Typology Code available for payment source | Source: Ambulatory Visit | Attending: Obstetrics and Gynecology | Admitting: Obstetrics and Gynecology

## 2014-11-16 ENCOUNTER — Encounter (HOSPITAL_COMMUNITY): Payer: Self-pay

## 2014-11-16 VITALS — BP 110/74 | Temp 98.0°F | Ht 69.0 in | Wt 222.2 lb

## 2014-11-16 DIAGNOSIS — Z01419 Encounter for gynecological examination (general) (routine) without abnormal findings: Secondary | ICD-10-CM

## 2014-11-16 NOTE — Progress Notes (Signed)
No complaints today.  Pap Smear:  Pap smear completed today per recommendation. Last Pap smear was 05/15/2014 at [facility] and negative with cellular changes consistent with hyperkeratosis with negative HPV. Per patient has no history of an abnormal Pap smear. Last Pap smear result is in EPIC.    Pelvic/Bimanual   Ext Genitalia No lesions, no swelling and no discharge observed on external genitalia.         Vagina Vagina pink and normal texture. No lesions or discharge observed in vagina.          Cervix Cervix is present. Cervix pink and of normal texture. No discharge observed.     Uterus Uterus is present and palpable. Uterus in normal position and normal size.        Adnexae Bilateral ovaries present and palpable. No tenderness on palpation.          Rectovaginal No rectal exam completed today since patient had no rectal complaints. No skin abnormalities observed on exam.

## 2014-11-16 NOTE — Patient Instructions (Addendum)
Explained to Maureen Reyes that [place] will cover Pap smears and co-testing every 5 years unless has a history of abnormal Pap smears. Reminded patient that she is due for follow-up left breast diagnostic mammogram and ultrasound in June. Let her know to call the [facility] to schedule. Let patient know will follow up with her within the next couple weeks with results of Pap smear by phone. Latoi Giraldo verbalized understanding.

## 2014-11-20 LAB — CYTOLOGY - PAP

## 2014-11-28 ENCOUNTER — Encounter: Payer: Self-pay | Admitting: Internal Medicine

## 2014-11-28 ENCOUNTER — Ambulatory Visit (INDEPENDENT_AMBULATORY_CARE_PROVIDER_SITE_OTHER): Payer: Self-pay | Admitting: Internal Medicine

## 2014-11-28 VITALS — BP 130/79 | HR 54 | Temp 97.8°F | Ht 69.0 in | Wt 227.7 lb

## 2014-11-28 DIAGNOSIS — F17210 Nicotine dependence, cigarettes, uncomplicated: Secondary | ICD-10-CM

## 2014-11-28 DIAGNOSIS — Z720 Tobacco use: Secondary | ICD-10-CM

## 2014-11-28 DIAGNOSIS — G43009 Migraine without aura, not intractable, without status migrainosus: Secondary | ICD-10-CM

## 2014-11-28 DIAGNOSIS — G629 Polyneuropathy, unspecified: Secondary | ICD-10-CM

## 2014-11-28 LAB — VITAMIN B12: VITAMIN B 12: 330 pg/mL (ref 211–911)

## 2014-11-28 LAB — GLUCOSE, CAPILLARY: GLUCOSE-CAPILLARY: 99 mg/dL (ref 70–99)

## 2014-11-28 LAB — POCT GLYCOSYLATED HEMOGLOBIN (HGB A1C): Hemoglobin A1C: 5.5

## 2014-11-28 MED ORDER — GABAPENTIN 100 MG PO CAPS: 100.0000 mg | ORAL_CAPSULE | Freq: Three times a day (TID) | ORAL | Status: DC

## 2014-11-28 MED ORDER — VARENICLINE TARTRATE 0.5 MG PO TABS: 0.5000 mg | ORAL_TABLET | Freq: Every day | ORAL | Status: DC

## 2014-11-28 NOTE — Progress Notes (Signed)
   Subjective:    Patient ID: Maureen Reyes, female    DOB: [birth year], 44 y.o.   MRN: [id number]  HPI Maureen Reyes is a 44yo woman with PMHx of migraines, sciatica, and tobacco abuse who presents today for an acute visit.  Patient reports having bilateral hand numbness/tingling and pain. She states this started a few months ago and has worsened over the last 2 weeks. She reports her right hand is worse than her left. She notes both a numbness/tingling sensation and also a throbbing pain that occasionally will reach 10/10 in severity. She has been taking Tylenol #3 and ibuprofen 800 mg when her pain is severe with minimal relief. This has been about twice a week. She notes her right hand hurts worse along the thenar eminence. She states the pain is worse at night. She also notes pain in the bottom of both of her feet which is worse at night as well. She denies frequent use of her hands, such as writing, typing, knitting, etc. She denies any fever, chills, rashes, other joint pains, weakness, swelling, or redness.    Review of Systems General: Denies night sweats, changes in weight, changes in appetite HEENT: Denies ear pain, changes in vision, rhinorrhea, sore throat CV: Denies CP, palpitations, SOB, orthopnea Pulm: Denies SOB, cough, wheezing GI: Denies abdominal pain, nausea, vomiting, diarrhea, constipation, melena, hematochezia GU: Denies dysuria, hematuria, frequency Msk: See above  Neuro: See above Skin: Denies bruising    Objective:   Physical Exam General: obese woman sitting up in chair, NAD HEENT: [place]/AT, EOMI, sclera anicteric, mucus membranes moist Neck: supple, ROM normal, no tenderness with ROM or palpation CV: RRR, no m/g/r Pulm: CTA bilaterally, breaths non-labored Abd: BS+, soft, non-tender Ext: warm, no edema. There is mild tenderness to palpation of the right thenar eminence. No tenderness to palpation of left hand. Tinnel's sign negative on both upper extremities.    Neuro: alert and oriented x 3. Strength intact in all extremities. She has decreased sensation on the bottom of her right foot compared to the left.     Assessment & Plan:  Please refer to A&P documentation.

## 2014-11-28 NOTE — Patient Instructions (Signed)
It was a pleasure seeing you today, Maureen Reyes.  Neuropathy - Start taking Gabapentin 100 mg three times a day - Checking labs today  Smoking cessation - Start Chantix 1 week BEFORE your quit date - Follow directions on hand out   General Instructions:   Please bring your medicines with you each time you come to clinic.  Medicines may include prescription medications, over-the-counter medications, herbal remedies, eye drops, vitamins, or other pills.   Progress Toward Treatment Goals:  No flowsheet data found.  Self Care Goals & Plans:  Self Care Goal 05/15/2014  Manage my medications take my medicines as prescribed; bring my medications to every visit; refill my medications on time  Eat healthy foods eat more vegetables; eat foods that are low in salt; eat baked foods instead of fried foods  Be physically active -  Stop smoking (No Data)    No flowsheet data found.   Care Management & Community Referrals:  No flowsheet data found.

## 2014-11-28 NOTE — Progress Notes (Signed)
INTERNAL MEDICINE TEACHING ATTENDING ADDENDUM - Anja Neuzil, MD: I reviewed and discussed at the time of visit with the resident Dr. Rivet, the patient's medical history, physical examination, diagnosis and results of pertinent tests and treatment and I agree with the patient's care as documented.  

## 2014-11-28 NOTE — Assessment & Plan Note (Signed)
Patient reports she has cut down to 6 cigarettes daily from 12 cigarettes daily. She states she wants to quit since her mother, who also smokes, was recently hospitalized and required a trach. She states cravings prevent her from quitting completely. She has tried nicotine patches in the past but these irritated her skin and did not work for her. She is interested in trying Chantix. She denies any hx of depression or psychiatric illness. We discussed the risks, benefits, and dosing of this medication. - Started on Chantix  - Recommended to choose a quit date and then start medication 1 week prior to that date - Congratulated her on making the choice to quit

## 2014-11-28 NOTE — Assessment & Plan Note (Signed)
Patient denies any recent headaches. Tylenol relieves her pain when she does get them.

## 2014-11-28 NOTE — Assessment & Plan Note (Signed)
Her pain seems most consistent with peripheral neuropathy. She denies any history of diabetes, but does have a strong family hx (mother and father) and has had elevated CBGs in the past. Will check HbA1c and Vitamin B12 level. - f/u HbA1c - f/u Vitamin B12 level - Started on Gabapentin 100 mg TID. Recommended to start BID and then increase to TID if pain not controlled.

## 2014-11-29 ENCOUNTER — Telehealth (HOSPITAL_COMMUNITY): Payer: Self-pay | Admitting: *Deleted

## 2014-11-29 NOTE — Telephone Encounter (Signed)
Telephoned patient at home # and advised patient of negative pap smear results. HPV was also negative. Next pap smear due in 5 years. Patient voiced understanding.

## 2014-12-19 ENCOUNTER — Other Ambulatory Visit: Payer: Self-pay | Admitting: Internal Medicine

## 2014-12-19 DIAGNOSIS — N6320 Unspecified lump in the left breast, unspecified quadrant: Secondary | ICD-10-CM

## 2015-01-25 ENCOUNTER — Ambulatory Visit
Admission: RE | Admit: 2015-01-25 | Discharge: 2015-01-25 | Disposition: A | Discharge status: Home / Self Care | Payer: No Typology Code available for payment source | Source: Ambulatory Visit | Attending: Internal Medicine | Admitting: Internal Medicine

## 2015-01-25 DIAGNOSIS — N6320 Unspecified lump in the left breast, unspecified quadrant: Secondary | ICD-10-CM

## 2015-05-31 ENCOUNTER — Other Ambulatory Visit: Payer: Self-pay | Admitting: Internal Medicine

## 2015-05-31 NOTE — Telephone Encounter (Signed)
Pt requesting gabapentin to be filled @ [facility] on [address].

## 2015-06-01 MED ORDER — GABAPENTIN 100 MG PO CAPS: 100.0000 mg | ORAL_CAPSULE | Freq: Three times a day (TID) | ORAL | Status: DC

## 2015-10-08 IMAGING — MG MM DIGITAL DIAGNOSTIC BILAT CAD
6 series · 6 of 6 positions shown · non-contrast
Comparison: Screening study, 07/03/2014, which was the baseline
exam.

CLINICAL DATA: Call back from screening for possible bilateral
breast masses.

EXAM:
DIGITAL DIAGNOSTIC  BILATERAL MAMMOGRAM
ULTRASOUND BILATERAL BREAST

[L CC (1 of 2)]
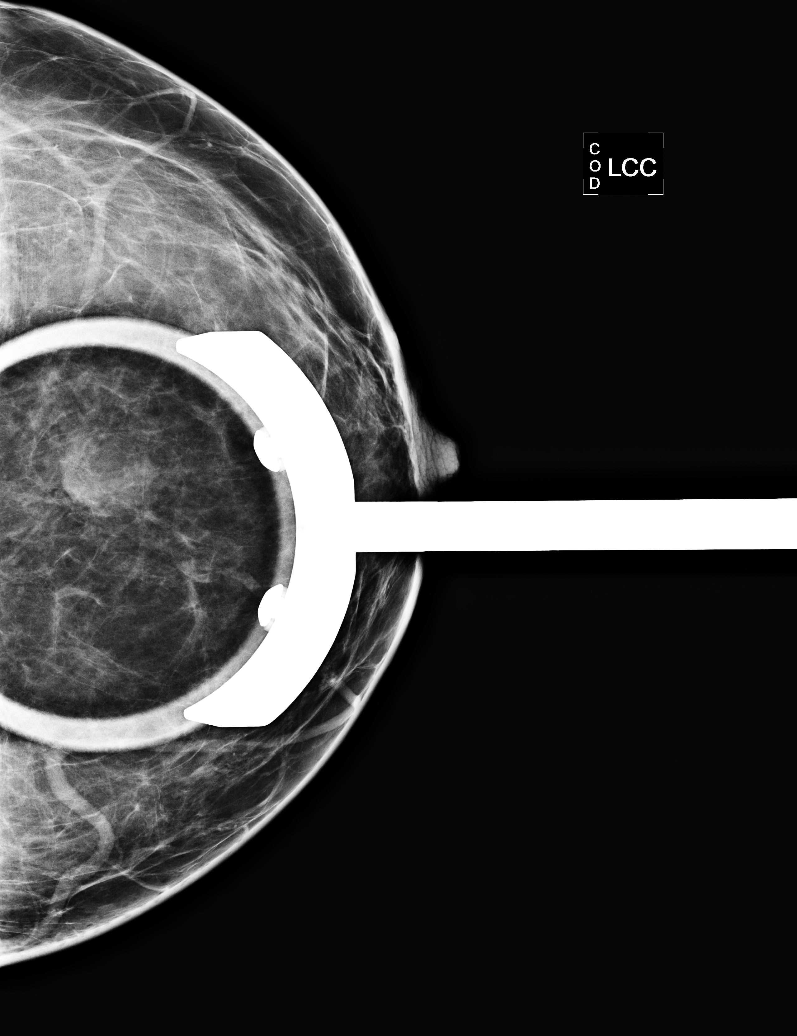

[L MLO]
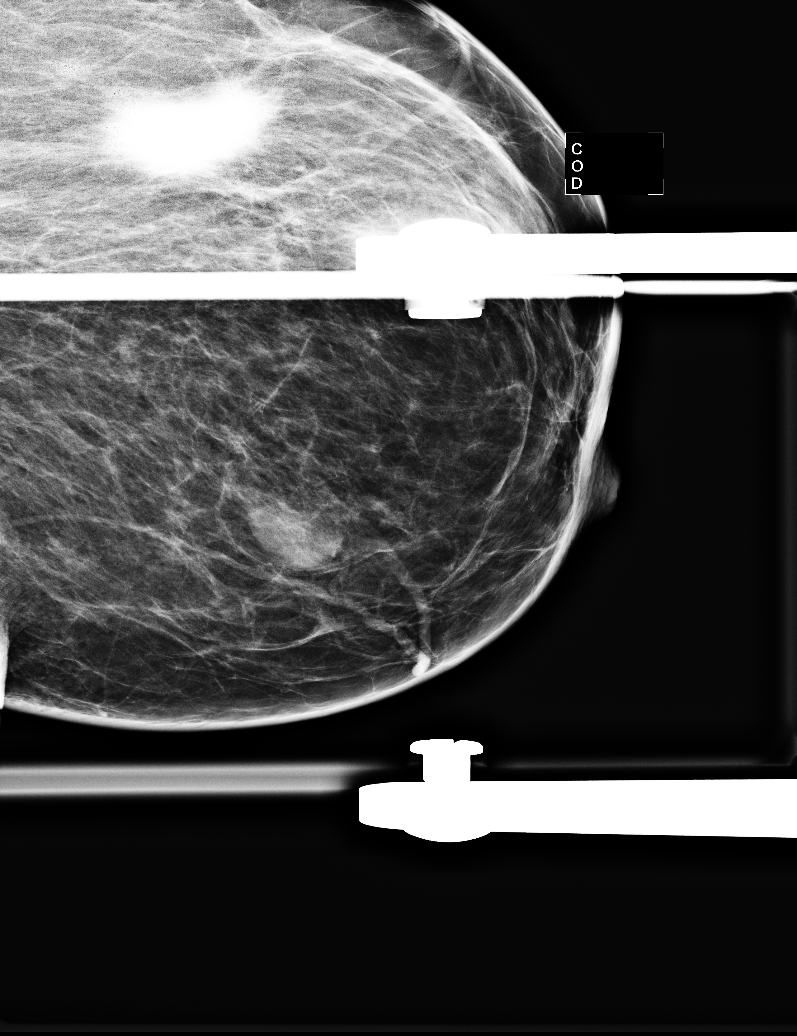

[R MLO (1 of 2)]
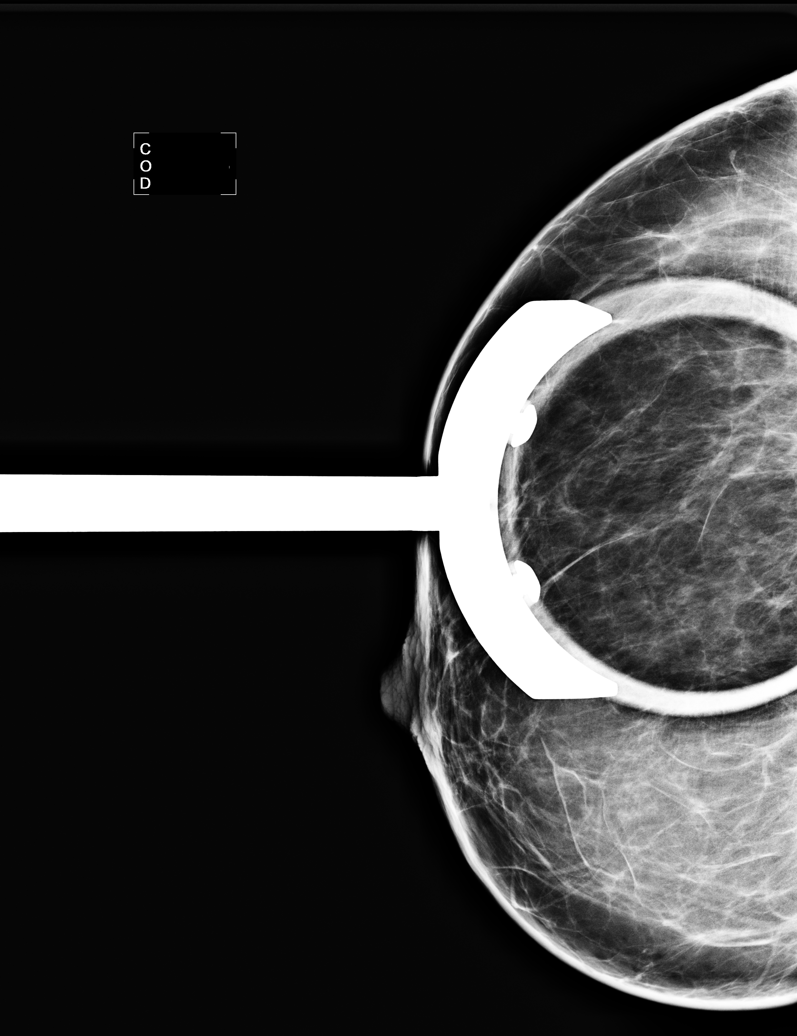

[L CC (2 of 2)]
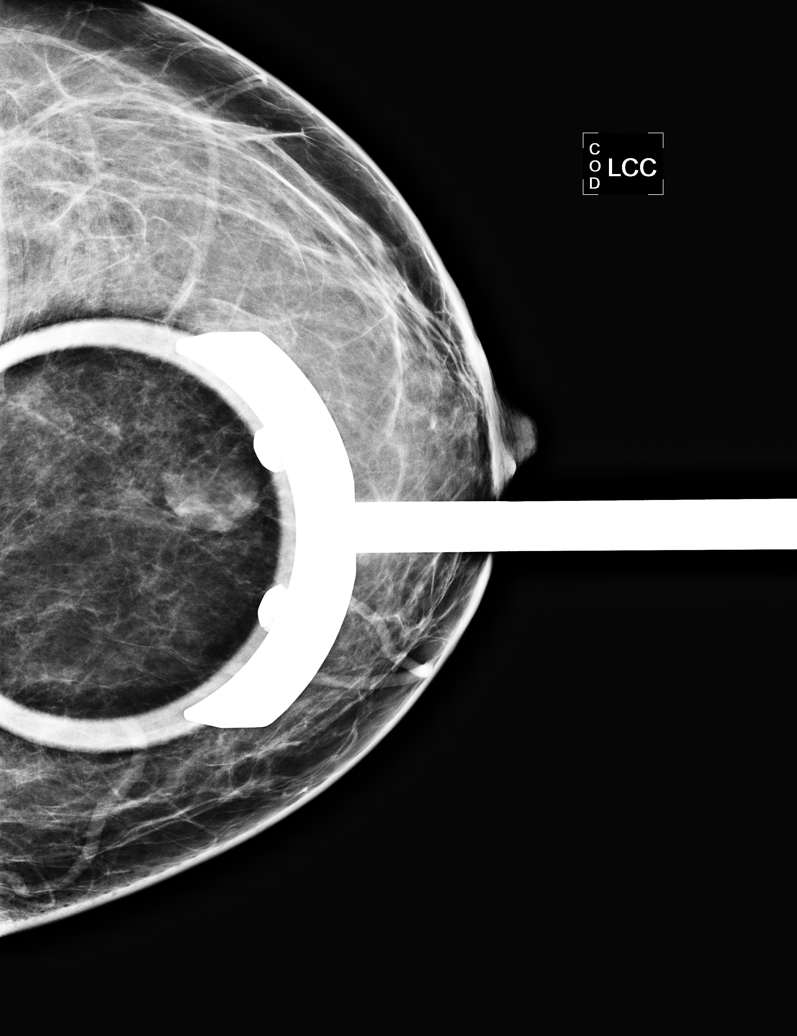

[R CC]
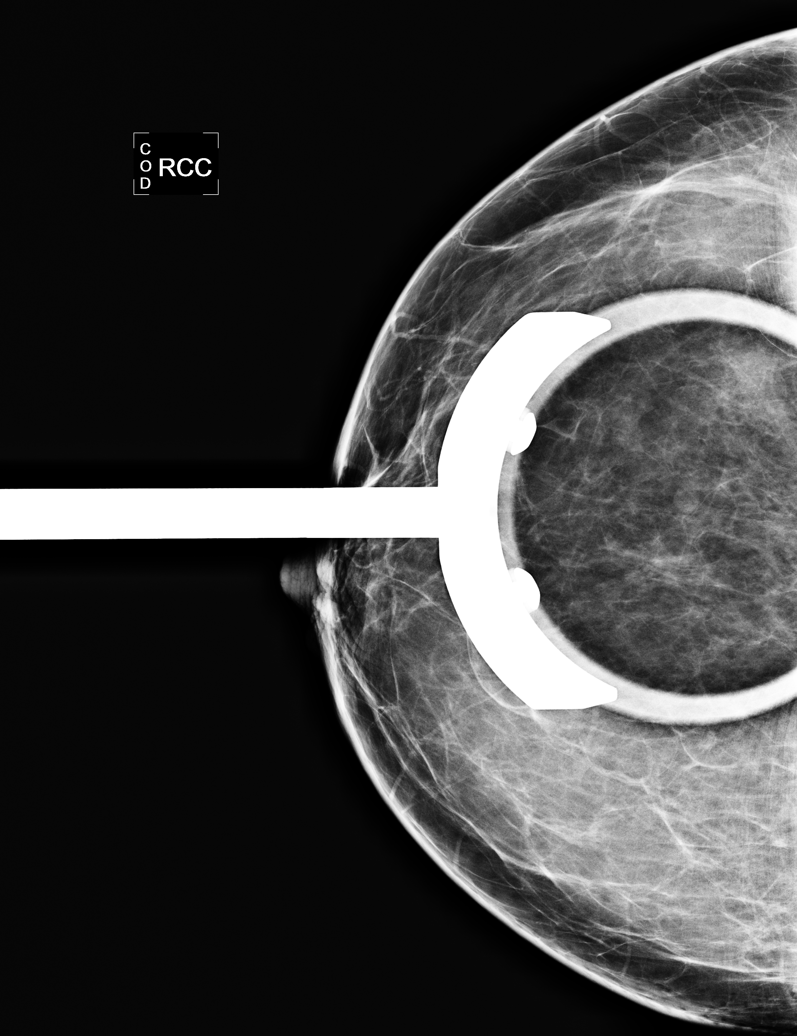

[R MLO (2 of 2)]
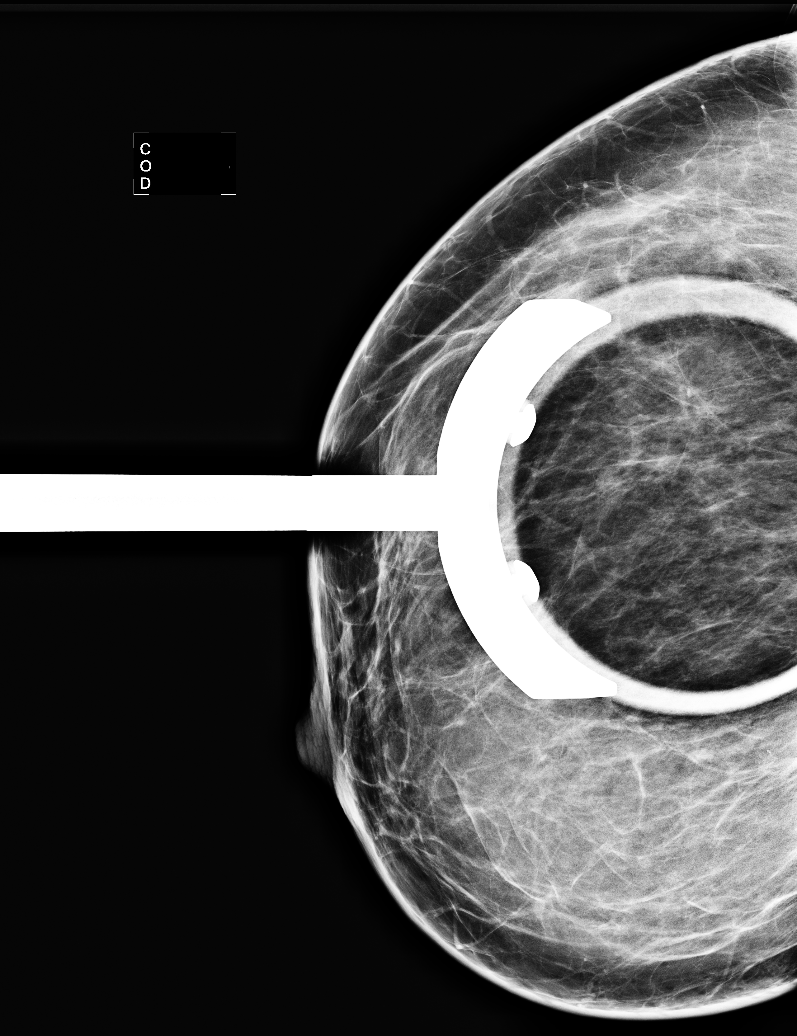

[6 of 6 positions shown; findings below may reference images not displayed]

ACR Breast Density Category b: There are scattered areas of
fibroglandular density.
FINDINGS: On the right, the small mass is faintly seen on spot compression CC
view, but not well-defined on the spot-compression MLO view. Is oval
configuration lying in the middle third, upper outer quadrant.

On the left, the mass persists. It is oval, with partly
circumscribed margins.

On physical exam, no mass is palpable in either breast.

Ultrasound is performed on the right demonstrating a small oval cyst
in the 11 o'clock position, 5 cm from the nipple measuring 5 mm x
1.8 mm x 4.4 mm. This is consistent in size, shape and location to
the mammographic finding. No other cysts in the right breast. No
solid masses.

Ultrasound on the left demonstrates a solid, heterogeneous, mildly
hypoechoic mass, oriented parallel to the skin, in the [DATE]
position, 5 cm the nipple, measuring 1.8 cm x 0.5 cm x 1.4 cm.
Margins are circumscribed and defined. Masses consistent in size,
shape and location to the mammographic abnormality.
IMPRESSION: 1. Probably benign left breast mass, most likely a fibroadenoma.
Short-term follow-up recommended.
2. Small benign right breast cyst. Annual screening mammography
recommended.

RECOMMENDATION:
Left breast ultrasound in 6 months to reassess the probably benign
solid left breast mass.

Routine annual screening mammography.

I have discussed the findings and recommendations with the patient.
Results were also provided in writing at the conclusion of the
visit. If applicable, a reminder letter will be sent to the patient
regarding the next appointment.

BI-RADS CATEGORY  3: Probably benign.

## 2016-02-27 ENCOUNTER — Other Ambulatory Visit: Payer: Self-pay | Admitting: Internal Medicine

## 2016-02-27 NOTE — Telephone Encounter (Signed)
Last visit 11/28/14

## 2016-08-11 ENCOUNTER — Other Ambulatory Visit: Payer: Self-pay | Admitting: Internal Medicine

## 2017-01-08 ENCOUNTER — Other Ambulatory Visit: Payer: Self-pay | Admitting: Internal Medicine

## 2017-01-08 NOTE — Telephone Encounter (Signed)
Needs an appointment- has not been seen in 2 years

## 2017-01-10 ENCOUNTER — Other Ambulatory Visit: Payer: Self-pay | Admitting: Internal Medicine

## 2017-01-15 ENCOUNTER — Other Ambulatory Visit: Payer: Self-pay | Admitting: Internal Medicine

## 2017-02-02 ENCOUNTER — Other Ambulatory Visit: Payer: Self-pay | Admitting: Internal Medicine

## 2017-02-18 ENCOUNTER — Ambulatory Visit (INDEPENDENT_AMBULATORY_CARE_PROVIDER_SITE_OTHER): Payer: Self-pay | Admitting: Internal Medicine

## 2017-02-18 VITALS — BP 166/103 | HR 53 | Temp 98.0°F | Wt 242.0 lb

## 2017-02-18 DIAGNOSIS — Z79899 Other long term (current) drug therapy: Secondary | ICD-10-CM

## 2017-02-18 DIAGNOSIS — R21 Rash and other nonspecific skin eruption: Secondary | ICD-10-CM

## 2017-02-18 DIAGNOSIS — G629 Polyneuropathy, unspecified: Secondary | ICD-10-CM

## 2017-02-18 DIAGNOSIS — I10 Essential (primary) hypertension: Secondary | ICD-10-CM

## 2017-02-18 DIAGNOSIS — F17210 Nicotine dependence, cigarettes, uncomplicated: Secondary | ICD-10-CM

## 2017-02-18 DIAGNOSIS — L209 Atopic dermatitis, unspecified: Secondary | ICD-10-CM | POA: Insufficient documentation

## 2017-02-18 DIAGNOSIS — R030 Elevated blood-pressure reading, without diagnosis of hypertension: Secondary | ICD-10-CM | POA: Insufficient documentation

## 2017-02-18 DIAGNOSIS — G6289 Other specified polyneuropathies: Secondary | ICD-10-CM

## 2017-02-18 LAB — POCT GLYCOSYLATED HEMOGLOBIN (HGB A1C): Hemoglobin A1C: 5.5

## 2017-02-18 LAB — GLUCOSE, CAPILLARY: Glucose-Capillary: 121 mg/dL — ABNORMAL HIGH (ref 65–99)

## 2017-02-18 MED ORDER — HYDROCHLOROTHIAZIDE 25 MG PO TABS: 25.0000 mg | ORAL_TABLET | Freq: Every day | ORAL | 1 refills | Status: DC

## 2017-02-18 MED ORDER — GABAPENTIN 100 MG PO CAPS: 100.0000 mg | ORAL_CAPSULE | Freq: Three times a day (TID) | ORAL | 3 refills | Status: DC

## 2017-02-18 NOTE — Assessment & Plan Note (Signed)
Patient continues to have peripheral neuropathy since prior office visit in 2016. He describes burning in her hands and feet. Sometimes the burning in her hands is so bad that it is painful for her to pick things up. This has not been completely controlled with the gabapentin 100 mg which she is taking once daily to request a gabapentin refill today. She has no history of diabetes and denies alcohol use. There is no clear explanation for her neuropathy at this time so we will need to work this up with further lab testing at this time. She may need EMG or neurology referral.  - ordered B12, MMA, HIV, RPR  - POC A1c today is 5.5   -Refilled gabapentin 100 mg 3 times daily

## 2017-02-18 NOTE — Progress Notes (Addendum)
CC: Follow up of peripheral neuropathy   HPI:  Maureen Reyes is a 46 y.o. with PMH as listed below who presents for peripheral neuropathy and rash. Please see the assessment and plans for the status of the patient chronic medical problems.   Past Medical History:  Diagnosis Date  . Chronic headaches   . Lumbar facet arthropathy   . Sciatica of right side    Review of Systems:  Refer to history of present illness and assessment and plans for pertinent review of systems, all others reviewed and negative  Physical Exam:  Vitals:   02/18/17 1018  BP: (!) 166/103  Pulse: (!) 53  Temp: 98 F (36.7 C)  TempSrc: Oral  SpO2: 100%  Weight: 242 lb (109.8 kg)   Physical Exam  Constitutional: She appears well-developed and well-nourished. No distress.  Cardiovascular: Normal rate and regular rhythm.   No murmur heard. Pulmonary/Chest: Effort normal. No respiratory distress. She has no wheezes. She has no rales.  Musculoskeletal: Normal range of motion.  No spinal or paraspinal muscle tenderness. Strength intact in upper and lower extremities   Skin: She is not diaphoretic.  Peeling skin over her back. Documented macules with overlying peeling skin over b/l medial mid foot    Assessment & Plan:   Peripheral neuropathy  Patient continues to have peripheral neuropathy since prior office visit in 2016. He describes burning in her hands and feet. Sometimes the burning in her hands is so bad that it is painful for her to pick things up. This has not been completely controlled with the gabapentin 100 mg which she is taking once daily to request a gabapentin refill today. She has no history of diabetes and denies alcohol use. There is no clear explanation for her neuropathy at this time so we will need to work this up with further lab testing at this time. She may need EMG or neurology referral.  - ordered B12, MMA, HIV, RPR  - POC A1c today is 5.5   -Refilled gabapentin 100 mg 3  times daily   Addendum: Lab testing did not reveal a cause for secondary neuropathy. Called the patient to communicate these results, she has continued to have burning in her hands despite taking the gabapentin as scheduled. She has a follow up scheduled 8/22, she wishes to address further testing at that time.   Rash  Describes itchy blistering rash over one side of her back and feet which occurs during the summer when she sits out in the sun or wears sandals. He itching improves with hydrocortisone cream. The rash eventually relieves on its own and then her skin peels. Strangely, she does not have a rash on her sun exposed face, arms or legs.  The description of this rash is unusual but may be associated with sun sensitivity.  - RTC when the rash is present for evaluation or if it is not self resolving  Hypertension  BP Readings from Last 3 Encounters:  02/18/17 (!) 166/103  11/28/14 130/79  11/16/14 110/74  BP not controlled today. She is not insured and expresses concern for cost of any new medications started. She is working on getting orange card coverage at this time. She describes occasional peripheral edema and does not have diabetes. I will prescribe one $4 list medication at this time and anticipate that she will need an additional therapy started at follow up.  - ordered HCTZ 25 mg daily - BMP today  - counseling on weight loss  and DASH diet  - RTC in one month for follow up   See Encounters Tab for problem based charting.  Patient discussed with Dr. Evette Doffing

## 2017-02-18 NOTE — Assessment & Plan Note (Signed)
BP Readings from Last 3 Encounters:  02/18/17 (!) 166/103  11/28/14 130/79  11/16/14 110/74  BP not controlled today. She is not insured and expresses concern for cost of any new medications started. She is working on getting orange card coverage at this time. She describes occasional peripheral edema and does not have diabetes. I will prescribe one $4 list medication at this time and anticipate that she will need an additional therapy started at follow up.  - ordered HCTZ 25 mg daily - BMP today  - counseling on weight loss and DASH diet  - RTC in one month for follow up

## 2017-02-18 NOTE — Patient Instructions (Addendum)
Maureen Reyes,  Start taking HCTZ 25 mg daily, I sent this medication to [facility] Return to the clinic in one month so we can recheck your blood pressure.   You can return to the clinic when your rash reoccurs so we can take a look at it at that time.   Return to the clinic in one month to recheck your blood pressure   DASH Eating Plan DASH stands for "Dietary Approaches to Stop Hypertension." The DASH eating plan is a healthy eating plan that has been shown to reduce high blood pressure (hypertension). It may also reduce your risk for type 2 diabetes, heart disease, and stroke. The DASH eating plan may also help with weight loss. What are tips for following this plan? General guidelines  Avoid eating more than 2,300 mg (milligrams) of salt (sodium) a day. If you have hypertension, you may need to reduce your sodium intake to 1,500 mg a day.  Limit alcohol intake to no more than 1 drink a day for nonpregnant women and 2 drinks a day for men. One drink equals 12 oz of beer, 5 oz of wine, or 1 oz of hard liquor.  Work with your health care provider to maintain a healthy body weight or to lose weight. Ask what an ideal weight is for you.  Get at least 30 minutes of exercise that causes your heart to beat faster (aerobic exercise) most days of the week. Activities may include walking, swimming, or biking.  Work with your health care provider or diet and nutrition specialist (dietitian) to adjust your eating plan to your individual calorie needs. Reading food labels  Check food labels for the amount of sodium per serving. Choose foods with less than 5 percent of the Daily Value of sodium. Generally, foods with less than 300 mg of sodium per serving fit into this eating plan.  To find whole grains, look for the word "whole" as the first word in the ingredient list. Shopping  Buy products labeled as "low-sodium" or "no salt added."  Buy fresh foods. Avoid canned foods and premade or frozen  meals. Cooking  Avoid adding salt when cooking. Use salt-free seasonings or herbs instead of table salt or sea salt. Check with your health care provider or pharmacist before using salt substitutes.  Do not fry foods. Cook foods using healthy methods such as baking, boiling, grilling, and broiling instead.  Cook with heart-healthy oils, such as olive, canola, soybean, or sunflower oil. Meal planning   Eat a balanced diet that includes: ? 5 or more servings of fruits and vegetables each day. At each meal, try to fill half of your plate with fruits and vegetables. ? Up to 6-8 servings of whole grains each day. ? Less than 6 oz of lean meat, poultry, or fish each day. A 3-oz serving of meat is about the same size as a deck of cards. One egg equals 1 oz. ? 2 servings of low-fat dairy each day. ? A serving of nuts, seeds, or beans 5 times each week. ? Heart-healthy fats. Healthy fats called Omega-3 fatty acids are found in foods such as flaxseeds and coldwater fish, like sardines, salmon, and mackerel.  Limit how much you eat of the following: ? Canned or prepackaged foods. ? Food that is high in trans fat, such as fried foods. ? Food that is high in saturated fat, such as fatty meat. ? Sweets, desserts, sugary drinks, and other foods with added sugar. ? Full-fat dairy products.  Do not salt foods before eating.  Try to eat at least 2 vegetarian meals each week.  Eat more home-cooked food and less restaurant, buffet, and fast food.  When eating at a restaurant, ask that your food be prepared with less salt or no salt, if possible. What foods are recommended? The items listed may not be a complete list. Talk with your dietitian about what dietary choices are best for you. Grains Whole-grain or whole-wheat bread. Whole-grain or whole-wheat pasta. Brown rice. [place]. Bulgur. Whole-grain and low-sodium cereals. Pita bread. Low-fat, low-sodium crackers. Whole-wheat flour  tortillas. Vegetables Fresh or frozen vegetables (raw, steamed, roasted, or grilled). Low-sodium or reduced-sodium tomato and vegetable juice. Low-sodium or reduced-sodium tomato sauce and tomato paste. Low-sodium or reduced-sodium canned vegetables. Fruits All fresh, dried, or frozen fruit. Canned fruit in natural juice (without added sugar). Meat and other protein foods Skinless chicken or [place]. Ground chicken or [place]. Pork with fat trimmed off. Fish and seafood. Egg whites. Dried beans, peas, or lentils. Unsalted nuts, nut butters, and seeds. Unsalted canned beans. Lean cuts of beef with fat trimmed off. Low-sodium, lean deli meat. Dairy Low-fat (1%) or fat-free (skim) milk. Fat-free, low-fat, or reduced-fat cheeses. Nonfat, low-sodium ricotta or cottage cheese. Low-fat or nonfat yogurt. Low-fat, low-sodium cheese. Fats and oils Soft margarine without trans fats. Vegetable oil. Low-fat, reduced-fat, or light mayonnaise and salad dressings (reduced-sodium). Canola, safflower, olive, soybean, and sunflower oils. Avocado. Seasoning and other foods Herbs. Spices. Seasoning mixes without salt. Unsalted popcorn and pretzels. Fat-free sweets. What foods are not recommended? The items listed may not be a complete list. Talk with your dietitian about what dietary choices are best for you. Grains Baked goods made with fat, such as croissants, muffins, or some breads. Dry pasta or rice meal packs. Vegetables Creamed or fried vegetables. Vegetables in a cheese sauce. Regular canned vegetables (not low-sodium or reduced-sodium). Regular canned tomato sauce and paste (not low-sodium or reduced-sodium). Regular tomato and vegetable juice (not low-sodium or reduced-sodium). Angie Fava. Olives. Fruits Canned fruit in a light or heavy syrup. Fried fruit. Fruit in cream or butter sauce. Meat and other protein foods Fatty cuts of meat. Ribs. Fried meat. Berniece Salines. Sausage. Bologna and other processed lunch meats.  Salami. Fatback. Hotdogs. Bratwurst. Salted nuts and seeds. Canned beans with added salt. Canned or smoked fish. Whole eggs or egg yolks. Chicken or [place] with skin. Dairy Whole or 2% milk, cream, and half-and-half. Whole or full-fat cream cheese. Whole-fat or sweetened yogurt. Full-fat cheese. Nondairy creamers. Whipped toppings. Processed cheese and cheese spreads. Fats and oils Butter. Stick margarine. Lard. Shortening. Ghee. Bacon fat. Tropical oils, such as coconut, palm kernel, or palm oil. Seasoning and other foods Salted popcorn and pretzels. Onion salt, garlic salt, seasoned salt, table salt, and sea salt. Worcestershire sauce. Tartar sauce. Barbecue sauce. Teriyaki sauce. Soy sauce, including reduced-sodium. Steak sauce. Canned and packaged gravies. Fish sauce. Oyster sauce. Cocktail sauce. Horseradish that you find on the shelf. Ketchup. Mustard. Meat flavorings and tenderizers. Bouillon cubes. Hot sauce and Tabasco sauce. Premade or packaged marinades. Premade or packaged taco seasonings. Relishes. Regular salad dressings. Where to find more information:  National Heart, Lung, and [place]: https://wilson-eaton.com/  American Heart Association: www.heart.org Summary  The DASH eating plan is a healthy eating plan that has been shown to reduce high blood pressure (hypertension). It may also reduce your risk for type 2 diabetes, heart disease, and stroke.  With the DASH eating plan, you should limit salt (sodium)  intake to 2,300 mg a day. If you have hypertension, you may need to reduce your sodium intake to 1,500 mg a day.  When on the DASH eating plan, aim to eat more fresh fruits and vegetables, whole grains, lean proteins, low-fat dairy, and heart-healthy fats.  Work with your health care provider or diet and nutrition specialist (dietitian) to adjust your eating plan to your individual calorie needs. This information is not intended to replace advice given to you by your health  care provider. Make sure you discuss any questions you have with your health care provider. Document Released: 07/03/2011 Document Revised: 07/07/2016 Document Reviewed: 07/07/2016 Elsevier Interactive Patient Education  2017 [employer].

## 2017-02-18 NOTE — Assessment & Plan Note (Signed)
Describes itchy blistering rash over one side of her back and feet which occurs during the summer when she sits out in the sun or wears sandals. He itching improves with hydrocortisone cream. The rash eventually relieves on its own and then her skin peels. Strangely, she does not have a rash on her sun exposed face, arms or legs.  The description of this rash is unusual but may be associated with sun sensitivity.  - RTC when the rash is present for evaluation or if it is not self resolving

## 2017-02-19 LAB — HIV ANTIBODY (ROUTINE TESTING W REFLEX): HIV SCREEN 4TH GENERATION: NONREACTIVE

## 2017-02-19 NOTE — Progress Notes (Signed)
Internal Medicine Clinic Attending  Case discussed with Dr. Blum at the time of the visit.  We reviewed the resident's history and exam and pertinent patient test results.  I agree with the assessment, diagnosis, and plan of care documented in the resident's note. 

## 2017-02-24 LAB — BMP8+ANION GAP
ANION GAP: 12 mmol/L (ref 10.0–18.0)
BUN / CREAT RATIO: 7 — AB (ref 9–23)
BUN: 6 mg/dL (ref 6–24)
CALCIUM: 9.4 mg/dL (ref 8.7–10.2)
CHLORIDE: 104 mmol/L (ref 96–106)
CO2: 22 mmol/L (ref 20–29)
Creatinine, Ser: 0.82 mg/dL (ref 0.57–1.00)
GFR calc Af Amer: 99 mL/min/{1.73_m2} (ref >59)
GFR calc non Af Amer: 85 mL/min/{1.73_m2} (ref >59)
Glucose: 117 mg/dL — ABNORMAL HIGH (ref 65–99)
POTASSIUM: 4.7 mmol/L (ref 3.5–5.2)
Sodium: 138 mmol/L (ref 134–144)

## 2017-02-24 LAB — VITAMIN B12: VITAMIN B 12: 409 pg/mL (ref 232–1245)

## 2017-02-24 LAB — METHYLMALONIC ACID, SERUM: METHYLMALONIC ACID: 91 nmol/L (ref 0–378)

## 2017-02-24 LAB — RPR: RPR Ser Ql: NONREACTIVE

## 2017-03-18 ENCOUNTER — Ambulatory Visit (INDEPENDENT_AMBULATORY_CARE_PROVIDER_SITE_OTHER): Payer: Self-pay | Admitting: Internal Medicine

## 2017-03-18 DIAGNOSIS — F17210 Nicotine dependence, cigarettes, uncomplicated: Secondary | ICD-10-CM

## 2017-03-18 DIAGNOSIS — G6289 Other specified polyneuropathies: Secondary | ICD-10-CM

## 2017-03-18 DIAGNOSIS — Z833 Family history of diabetes mellitus: Secondary | ICD-10-CM

## 2017-03-18 DIAGNOSIS — I10 Essential (primary) hypertension: Secondary | ICD-10-CM

## 2017-03-18 MED ORDER — HYDROCHLOROTHIAZIDE 25 MG PO TABS: 25.0000 mg | ORAL_TABLET | Freq: Every day | ORAL | 5 refills | Status: DC

## 2017-03-18 NOTE — Progress Notes (Signed)
   CC: For follow-up of her blood pressure.  HPI:  Ms.Thy Jerilynn Mages Surratt is a 46 y.o.lady with past medical history significant for recently diagnosed hypertension, started on HCTZ about a month ago came to the clinic for follow-up of her blood pressure.  Please see assessment and plan section for her chronic problems.  Past Medical History:  Diagnosis Date  . Chronic headaches   . Lumbar facet arthropathy   . Sciatica of right side    Review of Systems:  As per HPI.  Physical Exam:  Vitals:   03/18/17 0958  BP: 120/80  Pulse: 70  Temp: 98 F (36.7 C)  TempSrc: Oral  SpO2: 100%  Weight: 234 lb 11.2 oz (106.5 kg)   Vitals:   03/18/17 0958  BP: 120/80  Pulse: 70  Temp: 98 F (36.7 C)  TempSrc: Oral  SpO2: 100%  Weight: 234 lb 11.2 oz (106.5 kg)  Height: 5\' 9"  (1.753 m)   General: Vital signs reviewed.  Patient is well-developed and well-nourished, in no acute distress and cooperative with exam.  Head: Normocephalic and atraumatic. Eyes: EOMI, conjunctivae normal, no scleral icterus.  Cardiovascular: RRR, S1 normal, S2 normal, no murmurs, gallops, or rubs. Pulmonary/Chest: Clear to auscultation bilaterally, no wheezes, rales, or rhonchi. Abdominal: Soft, non-tender, non-distended, BS +, no masses, organomegaly, or guarding present.  Extremities: No lower extremity edema bilaterally,  pulses symmetric and intact bilaterally. No cyanosis or clubbing. Skin: Warm, dry and intact. No rashes or erythema. Psychiatric: Normal mood and affect. speech and behavior is normal. Cognition and memory are normal.  Assessment & Plan:   See Encounters Tab for problem based charting.  Patient discussed with Dr. Angelia Mould.

## 2017-03-18 NOTE — Assessment & Plan Note (Signed)
BP Readings from Last 3 Encounters:  03/18/17 120/80  02/18/17 (!) 166/103  11/28/14 130/79   She was normotensive today. She denies any headache or dizziness. She is compliant with her medicine, she had questions regarding continuation of this medicine stating that if she can stop taking medicine as her blood pressure is normal now.  I explained her about hypertension and need to continue taking medication for it better control. I also advised her to keep herself well hydrated, exercise regularly and follow a DASH diet, as it will all help keeping her blood pressure under good control. -Follow-up in 3 months with PCP.

## 2017-03-18 NOTE — Assessment & Plan Note (Signed)
She continued to have peripheral neuropathy symptoms, all of her lab work was within normal limit. She described more burning in her feet and pain in her hands. According to patient gabapentin does work but unable to relieve her symptoms completely.  She is on gabapentin 100 mg 3 times a day-I offered her an increase in her dose which she refused stating that she wants to see if her symptoms get controlled with this dose.Marland Kitchen She will discuss any changes in her dose during next follow-up visit if needed. I also discussed with her regarding getting a nerve conduction study, patient states that rather she will wait until she will get her insurance.

## 2017-03-18 NOTE — Patient Instructions (Signed)
Thank you for visiting clinic today. Your blood pressure was very good today-keep up the good work and keep taking your medications regularly. Exercising regularly, keeping herself well hydrated and following a low sodium diet will help you keep your blood pressure under good control. As we discuss I am not increasing the dose of your gabapentin at this time, you can discuss it with your next follow-up visit if those burning sensations keep bothering you. Please follow-up with your PCP in 3 month.

## 2017-03-22 NOTE — Progress Notes (Signed)
Internal Medicine Clinic Attending  Case discussed with Dr. Amin at the time of the visit.  We reviewed the resident's history and exam and pertinent patient test results.  I agree with the assessment, diagnosis, and plan of care documented in the resident's note.    

## 2017-06-22 ENCOUNTER — Other Ambulatory Visit: Payer: Self-pay

## 2017-06-22 ENCOUNTER — Ambulatory Visit: Payer: Self-pay | Admitting: Internal Medicine

## 2017-06-22 ENCOUNTER — Encounter: Payer: Self-pay | Admitting: Internal Medicine

## 2017-06-22 VITALS — BP 129/89 | HR 76 | Temp 97.0°F | Ht 69.0 in | Wt 234.7 lb

## 2017-06-22 DIAGNOSIS — F17210 Nicotine dependence, cigarettes, uncomplicated: Secondary | ICD-10-CM

## 2017-06-22 DIAGNOSIS — G8929 Other chronic pain: Secondary | ICD-10-CM

## 2017-06-22 DIAGNOSIS — Z0000 Encounter for general adult medical examination without abnormal findings: Secondary | ICD-10-CM

## 2017-06-22 DIAGNOSIS — Z23 Encounter for immunization: Secondary | ICD-10-CM

## 2017-06-22 DIAGNOSIS — M5431 Sciatica, right side: Secondary | ICD-10-CM

## 2017-06-22 DIAGNOSIS — Z79899 Other long term (current) drug therapy: Secondary | ICD-10-CM

## 2017-06-22 DIAGNOSIS — I10 Essential (primary) hypertension: Secondary | ICD-10-CM

## 2017-06-22 DIAGNOSIS — M5416 Radiculopathy, lumbar region: Secondary | ICD-10-CM

## 2017-06-22 MED ORDER — IBUPROFEN 800 MG PO TABS: 800.0000 mg | ORAL_TABLET | Freq: Three times a day (TID) | ORAL | 3 refills | Status: DC | PRN

## 2017-06-22 MED ORDER — HYDROCHLOROTHIAZIDE 25 MG PO TABS: 25.0000 mg | ORAL_TABLET | Freq: Every day | ORAL | 5 refills | Status: DC

## 2017-06-22 NOTE — Assessment & Plan Note (Signed)
Flu shot today 

## 2017-06-22 NOTE — Assessment & Plan Note (Signed)
Patient endorses longstanding chronic back pain which radiates to her R lower extremity. Discussed appropriate use of stretching, heat/ice, and NSAIDs. Patient does not yet have paperwork completed for orange card, but would consider physical therapy for this injury if the treatment becomes more affordable once orange card paperwork finished. Will continue conservative medical therapy for now and discuss PT at follow up in 3-6 months.   Plan: -Ibuprofen 800 mg TID PRN -Referral to PT/Sports medicine once orange card obtained -RTC in 3-6 months

## 2017-06-22 NOTE — Assessment & Plan Note (Signed)
BP at goal of <140/80 today. Recent BMP prior to initiation of HCTZ shows Cr around 0.8 with normal electrolytes. Will repeat today to ensure kidney function and electrolytes within normal limits on new diuretic medication. Plan to continue current regimen of HCTZ 25 mg daily unless above blood work shows new abnormality.   Plan: -Continue HCTZ 25 mg daily -BMP today -RTC in 3-6 months

## 2017-06-22 NOTE — Patient Instructions (Addendum)
Thank you for seeing [place] today!  Your blood pressure looks great! Please continue taking your hydrochlorothiazide daily. You got blood work done in the clinic today. I will call you later this week with the results of this test.   For your low back pain, please take 800 mg ibuprofen every 8 hours as needed. If your low back pain continues, we will refer you to a physical therapist at your next appointment. Please work with Ms Berdine Addison to complete all your paper work for your [employer]. This will help [place] find you an affordable physical therapist if you need one at your next visit!  Please return to clinic in 3-6 months for evaluation of your blood pressure and back pain.

## 2017-06-22 NOTE — Progress Notes (Signed)
   CC: HTN follow up  HPI:  [name] is a 46 y.o. with a PMH of HTN who is presenting for management of her chronic medical condition. She states that since her last clinic visit in August 2018 she has felt well and does has not had any difficulty taking her medications every day. She takes her blood pressure at home once every 1-2 days and states that it usually runs in the 120s/70s-80s. She denies any symptoms of dizziness with standing and headaches while taking this medications. The only thing that has been bothering her recently is her lower back. It hurts worse in the morning when she first wakes up and usually goes away after stretching. The low back pain is associated with shooting pain down her R lower extremity. She has had this type of pain before in the past and it usually goes away with stretching and movement throughout the rest of the day.  Past Medical History: Past Medical History:  Diagnosis Date  . Chronic headaches   . Lumbar facet arthropathy   . Sciatica of right side    Review of Systems:   Patient endorses intermittent low back pain that radiates to right lower extremity, as per HPI Patient denies chest pain, shortness of breath, abdominal pain, diaphoresis, nausea/vomiting, lower extremity swelling, and change in bowel/bladder habits.  Physical Exam:  Vitals:   06/22/17 1440  BP: 129/89  Pulse: 76  Temp: (!) 97 F (36.1 C)  TempSrc: Oral  SpO2: 99%  Weight: 234 lb 11.2 oz (106.5 kg)  Height: 5\' 9"  (1.753 m)   Physical Exam  Constitutional: She appears well-developed and well-nourished. No distress.  HENT:  Mouth/Throat: Oropharynx is clear and moist. No oropharyngeal exudate.  Cardiovascular: Normal rate, regular rhythm and intact distal pulses. Exam reveals no friction rub.  No murmur heard. Respiratory: Effort normal. No respiratory distress. She has no wheezes.  GI: Soft. Bowel sounds are normal. She exhibits no distension. There is no  tenderness.  Musculoskeletal: She exhibits no edema (of bilateral lower extremities) or tenderness (of bilateral lower extremities).  No point tenderness in vertebrae of lower back appreciated. Some tenderness with palpation of paraspinous muscles.  Lymphadenopathy:    She has no cervical adenopathy.  Skin: Skin is warm and dry. No rash noted. She is not diaphoretic. No erythema. No pallor.   Assessment & Plan:   See Encounters Tab for problem based charting.  Patient seen with Dr. Daryll Drown

## 2017-06-23 LAB — BMP8+ANION GAP
ANION GAP: 13 mmol/L (ref 10.0–18.0)
BUN / CREAT RATIO: 4 — AB (ref 9–23)
BUN: 3 mg/dL — AB (ref 6–24)
CALCIUM: 9.1 mg/dL (ref 8.7–10.2)
CO2: 23 mmol/L (ref 20–29)
Chloride: 103 mmol/L (ref 96–106)
Creatinine, Ser: 0.72 mg/dL (ref 0.57–1.00)
GFR, EST AFRICAN AMERICAN: 115 mL/min/{1.73_m2} (ref >59)
GFR, EST NON AFRICAN AMERICAN: 99 mL/min/{1.73_m2} (ref >59)
Glucose: 106 mg/dL — ABNORMAL HIGH (ref 65–99)
Potassium: 4 mmol/L (ref 3.5–5.2)
Sodium: 139 mmol/L (ref 134–144)

## 2017-06-23 NOTE — Progress Notes (Signed)
Internal Medicine Clinic Attending  I saw and evaluated the patient.  I personally confirmed the key portions of the history and exam documented by Dr. Nedrud and I reviewed pertinent patient test results.  The assessment, diagnosis, and plan were formulated together and I agree with the documentation in the resident's note.  

## 2017-11-30 ENCOUNTER — Encounter: Payer: Self-pay | Admitting: Internal Medicine

## 2017-12-09 ENCOUNTER — Other Ambulatory Visit: Payer: Self-pay

## 2017-12-09 ENCOUNTER — Encounter (INDEPENDENT_AMBULATORY_CARE_PROVIDER_SITE_OTHER): Payer: Self-pay

## 2017-12-09 ENCOUNTER — Ambulatory Visit: Payer: Self-pay | Admitting: Internal Medicine

## 2017-12-09 VITALS — BP 139/87 | HR 63 | Temp 98.0°F | Ht 69.0 in | Wt 233.8 lb

## 2017-12-09 DIAGNOSIS — E669 Obesity, unspecified: Secondary | ICD-10-CM | POA: Insufficient documentation

## 2017-12-09 DIAGNOSIS — F17210 Nicotine dependence, cigarettes, uncomplicated: Secondary | ICD-10-CM

## 2017-12-09 DIAGNOSIS — Z720 Tobacco use: Secondary | ICD-10-CM

## 2017-12-09 DIAGNOSIS — I10 Essential (primary) hypertension: Secondary | ICD-10-CM

## 2017-12-09 DIAGNOSIS — Z79899 Other long term (current) drug therapy: Secondary | ICD-10-CM

## 2017-12-09 DIAGNOSIS — Z6834 Body mass index (BMI) 34.0-34.9, adult: Secondary | ICD-10-CM

## 2017-12-09 DIAGNOSIS — Z23 Encounter for immunization: Secondary | ICD-10-CM

## 2017-12-09 NOTE — Assessment & Plan Note (Signed)
Pt reports being prescribed chantix in the past to quit smoking but she could not afford it.  She would like to try to quit.  She is currently smoking 1/2ppd.  Additionally, she has had trouble with the nicotene patch and skin irritation.  I asked if she would like to try gum vs lozenge and she would prefer the gum.    -pt will begin taking nicorette gum over the counter

## 2017-12-09 NOTE — Assessment & Plan Note (Signed)
We discussed weight loss today.  She is down 10lbs over the last 10 months.  She continues to increase the distance she is walking daily.  Her diet has been more difficult to change but she continues to try and increase her fruit and vegetable intake and decrease sweets.  -encouraged continuing daily exercise and a health diet

## 2017-12-09 NOTE — Assessment & Plan Note (Signed)
BP Readings from Last 3 Encounters:  12/09/17 139/87  06/22/17 129/89  03/18/17 120/80   BMP Latest Ref Rng & Units 06/22/2017 02/18/2017 05/03/2014  Glucose 65 - 99 mg/dL 106(H) 117(H) 102(H)  BUN 6 - 24 mg/dL 3(L) 6 5(L)  Creatinine 0.57 - 1.00 mg/dL 0.72 0.82 0.69  BUN/Creat Ratio 9 - 23 4(L) 7(L) -  Sodium 134 - 144 mmol/L 139 138 139  Potassium 3.5 - 5.2 mmol/L 4.0 4.7 4.2  Chloride 96 - 106 mmol/L 103 104 108  CO2 20 - 29 mmol/L 23 22 23   Calcium 8.7 - 10.2 mg/dL 9.1 9.4 9.3   BP within goal today, renal function and electrolytes stable.  No side effects reported.  -continue HCTZ 25mg  daily.

## 2017-12-09 NOTE — Patient Instructions (Addendum)
Maureen Reyes, you are doing very well.  We will get you a tetanus shot today.  Otherwise you are up to date on all your screenings.  Your blood pressure looks good today so continue the HCTZ.

## 2017-12-09 NOTE — Progress Notes (Signed)
CC: follow up of HTN, tobacco use  HPI:  [name] is a 47 y.o. female with PMH below.  She has been doing very well overall and has no new complaints.  Still smoking 1/2 ppd, is interested in trying to quit.  She also inquires about the process of kidney donation as she has a relative who needs a kidney transplant.    Please see A&P for status of the patient's chronic medical conditions  Past Medical History:  Diagnosis Date  . Chronic headaches   . Lumbar facet arthropathy   . Sciatica of right side    Review of Systems:   ROS: Pulmonary: pt denies increased work of breathing, shortness of breath,  Cardiac: pt denies palpitations, chest pain,  Abdominal: pt denies abdominal pain, nausea, vomiting, or diarrhea  Physical Exam:  Vitals:   12/09/17 0908  BP: 139/87  Pulse: 63  Temp: 98 F (36.7 C)  TempSrc: Oral  SpO2: 100%  Weight: 233 lb 12.8 oz (106.1 kg)  Height: 5\' 9"  (1.753 m)   Physical Exam  Constitutional: No distress.  Cardiovascular: Normal rate, regular rhythm and normal heart sounds. Exam reveals no gallop and no friction rub.  No murmur heard. Pulmonary/Chest: Effort normal and breath sounds normal. No respiratory distress. She has no wheezes. She has no rales. She exhibits no tenderness.  Abdominal: Soft. Bowel sounds are normal. She exhibits no distension and no mass. There is no tenderness. There is no rebound and no guarding.  Neurological: She is alert.  Skin: She is not diaphoretic.  Psychiatric: She has a normal mood and affect.    Social History   Socioeconomic History  . Marital status: Single    Spouse name: Not on file  . Number of children: Not on file  . Years of education: Not on file  . Highest education level: Not on file  Occupational History  . Not on file  Social Needs  . Financial resource strain: Not on file  . Food insecurity:    Worry: Not on file    Inability: Not on file  . Transportation needs:   Medical: Not on file    Non-medical: Not on file  Tobacco Use  . Smoking status: Current Every Day Smoker    Packs/day: 0.25    Years: 20.00    Pack years: 5.00    Types: Cigarettes  . Tobacco comment: 1/2 PPD  Substance and Sexual Activity  . Alcohol use: No  . Drug use: No  . Sexual activity: Yes    Birth control/protection: Surgical  Lifestyle  . Physical activity:    Days per week: Not on file    Minutes per session: Not on file  . Stress: Not on file  Relationships  . Social connections:    Talks on phone: Not on file    Gets together: Not on file    Attends religious service: Not on file    Active member of club or organization: Not on file    Attends meetings of clubs or organizations: Not on file    Relationship status: Not on file  . Intimate partner violence:    Fear of current or ex partner: Not on file    Emotionally abused: Not on file    Physically abused: Not on file    Forced sexual activity: Not on file  Other Topics Concern  . Not on file  Social History Narrative  . Not on file    Family  History  Problem Relation Age of Onset  . Diabetes Mellitus II Mother   . Diabetes Mellitus II Father     Assessment & Plan:   See Encounters Tab for problem based charting.  Patient discussed with Dr. Angelia Mould

## 2017-12-14 ENCOUNTER — Encounter: Payer: Self-pay | Admitting: Internal Medicine

## 2017-12-14 NOTE — Progress Notes (Signed)
Internal Medicine Clinic Attending  Case discussed with Dr. Winfrey  at the time of the visit.  We reviewed the resident's history and exam and pertinent patient test results.  I agree with the assessment, diagnosis, and plan of care documented in the resident's note.  

## 2018-01-13 ENCOUNTER — Encounter: Payer: Self-pay | Admitting: *Deleted

## 2018-04-26 ENCOUNTER — Ambulatory Visit: Payer: Self-pay | Admitting: Internal Medicine

## 2018-04-26 ENCOUNTER — Other Ambulatory Visit: Payer: Self-pay

## 2018-04-26 ENCOUNTER — Encounter (INDEPENDENT_AMBULATORY_CARE_PROVIDER_SITE_OTHER): Payer: Self-pay

## 2018-04-26 ENCOUNTER — Encounter: Payer: Self-pay | Admitting: Internal Medicine

## 2018-04-26 VITALS — BP 122/91 | HR 62 | Temp 98.6°F | Wt 237.7 lb

## 2018-04-26 DIAGNOSIS — I10 Essential (primary) hypertension: Secondary | ICD-10-CM

## 2018-04-26 DIAGNOSIS — Z6835 Body mass index (BMI) 35.0-35.9, adult: Secondary | ICD-10-CM

## 2018-04-26 DIAGNOSIS — E669 Obesity, unspecified: Secondary | ICD-10-CM

## 2018-04-26 DIAGNOSIS — M25512 Pain in left shoulder: Secondary | ICD-10-CM

## 2018-04-26 DIAGNOSIS — F17210 Nicotine dependence, cigarettes, uncomplicated: Secondary | ICD-10-CM

## 2018-04-26 DIAGNOSIS — G6289 Other specified polyneuropathies: Secondary | ICD-10-CM

## 2018-04-26 DIAGNOSIS — G629 Polyneuropathy, unspecified: Secondary | ICD-10-CM

## 2018-04-26 DIAGNOSIS — Z720 Tobacco use: Secondary | ICD-10-CM

## 2018-04-26 DIAGNOSIS — Z79899 Other long term (current) drug therapy: Secondary | ICD-10-CM

## 2018-04-26 DIAGNOSIS — Z23 Encounter for immunization: Secondary | ICD-10-CM

## 2018-04-26 MED ORDER — GABAPENTIN 100 MG PO CAPS: 100.0000 mg | ORAL_CAPSULE | Freq: Every day | ORAL | 3 refills | Status: DC

## 2018-04-26 MED ORDER — MELOXICAM 7.5 MG PO TABS: 7.5000 mg | ORAL_TABLET | Freq: Every day | ORAL | 0 refills | Status: DC

## 2018-04-26 NOTE — Progress Notes (Signed)
CC: Chronic medical conditions  HPI: [name] is a 47 y.o. with a history of hypertension, peripheral neuropathy, and obesity who presented for management of her chronic medical conditions and left shoulder pain.   Shoulder pain: Assessment: In regards to her shoulder pain she reports that it has been going on since her last visit where she got her tetanus shot, she does not know if it was related to the shot just that this is when she started to notice it. She reports that it occurs when she is trying to raise her arm above her head or behind her, it is located on the lateral aspect of her upper arm, is sharp in nature and will last for 10-15 seconds. She reports that she has not tried anything to relieve it since it lasts for such a short time. She denies and pain to palpation and no numbness or tingling in her arm.   Hypertension Assessment: She is currently on HCTZ 25 mg daily and her blood pressure has been well controlled. Blood pressure today was 141/83 with a repeat blood pressure of 122/91, she is at her goal of <130/80. She reports that she checks her blood pressure at home and it is normally around 119/79. She reports that she has been walking on her treadmill up to 30 minutes a day. Patients blood pressure is well controlled and we discussed her diet and exercise regimen. She reports that she has been walking up to 30 minutes a day on her treadmill.   Peripheral Neuropathy Assessment: Had burning sensation in feet and pain in hands previously and was placed on gabapentin 100 mg TID. She is only taking it once a day at night and she states that she feels like her symptosm are well controlled. She reports that she rarely gets numbness or tingling in her hands. She reports that she still gets a burning sensation on the top of her feet up to 3 times a day.  Tobacco use Assessment: Patient reports that she has not started the nicorette gums, she is smoking around 1/2 pack per  day. She has a bad reaction to the patch when she used it and is unable to get chantix due to insurance issues.   Please see A&P for status of the patient's chronic medical conditions  Past Medical History:  Diagnosis Date  . Chronic headaches   . Lumbar facet arthropathy   . Sciatica of right side    Review of Systems:   Review of Systems  Constitutional: Negative for chills, fever, malaise/fatigue and weight loss.  HENT: Negative for congestion, ear pain and sinus pain.   Eyes: Negative for pain and discharge.  Respiratory: Negative for cough, shortness of breath and wheezing.   Cardiovascular: Negative for chest pain, palpitations and orthopnea.  Gastrointestinal: Negative for abdominal pain, constipation, diarrhea, nausea and vomiting.  Genitourinary: Negative for dysuria, frequency and urgency.  Musculoskeletal: Positive for myalgias (left shoulder pain). Negative for back pain and joint pain.  Skin: Negative for itching and rash.  Neurological: Negative for dizziness, weakness and headaches.  Psychiatric/Behavioral: Negative for depression. The patient is not nervous/anxious.      Physical Exam:  Vitals:   04/26/18 1329 04/26/18 1418  BP: (!) 141/83 (!) 122/91  Pulse: 65 62  Temp: 98.6 F (37 C)   TempSrc: Oral   SpO2: 99%   Weight: 237 lb 11.2 oz (107.8 kg)    Physical Exam  Constitutional: She is oriented to person, place, and  time and well-developed, well-nourished, and in no distress.  HENT:  Head: Normocephalic and atraumatic.  Eyes: Pupils are equal, round, and reactive to light. Conjunctivae and EOM are normal.  Neck: Normal range of motion. Neck supple. No thyromegaly present.  Cardiovascular: Normal rate, regular rhythm and normal heart sounds. Exam reveals no gallop and no friction rub.  No murmur heard. Pulmonary/Chest: Effort normal and breath sounds normal. No respiratory distress. She has no wheezes.  Abdominal: Soft. Bowel sounds are normal. She  exhibits no distension.  Musculoskeletal:       Left shoulder: She exhibits tenderness (Pain over left upper extremity with lifting arm forward and to the side). She exhibits normal range of motion, no swelling, no deformity and normal strength.  Positive left arm empty can test  Neurological: She is alert and oriented to person, place, and time. Gait normal.  Skin: Skin is warm and dry. No erythema.  Psychiatric: Mood and affect normal.    Social History   Socioeconomic History  . Marital status: Single    Spouse name: Not on file  . Number of children: Not on file  . Years of education: Not on file  . Highest education level: Not on file  Occupational History  . Not on file  Social Needs  . Financial resource strain: Not on file  . Food insecurity:    Worry: Not on file    Inability: Not on file  . Transportation needs:    Medical: Not on file    Non-medical: Not on file  Tobacco Use  . Smoking status: Current Every Day Smoker    Packs/day: 0.25    Years: 20.00    Pack years: 5.00    Types: Cigarettes  . Smokeless tobacco: Never Used  . Tobacco comment: 1/2 PPD  Substance and Sexual Activity  . Alcohol use: No  . Drug use: No  . Sexual activity: Yes    Birth control/protection: Surgical  Lifestyle  . Physical activity:    Days per week: Not on file    Minutes per session: Not on file  . Stress: Not on file  Relationships  . Social connections:    Talks on phone: Not on file    Gets together: Not on file    Attends religious service: Not on file    Active member of club or organization: Not on file    Attends meetings of clubs or organizations: Not on file    Relationship status: Not on file  . Intimate partner violence:    Fear of current or ex partner: Not on file    Emotionally abused: Not on file    Physically abused: Not on file    Forced sexual activity: Not on file  Other Topics Concern  . Not on file  Social History Narrative  . Not on file     Family History  Problem Relation Age of Onset  . Diabetes Mellitus II Mother   . Diabetes Mellitus II Father     Assessment & Plan:   See Encounters Tab for problem based charting.  Patient seen with Dr. Dareen Piano

## 2018-04-26 NOTE — Patient Instructions (Addendum)
Dear Ms. Sealy,   Thank you for coming in to see [place] today.   Your blood pressure was great today!  Please continue hydrochlorothiazide 25 mg daily  Please increase the fruits and vegetables in your diet and watch your sodium intake Please start the nicorette gum to help decrease your smoking  In regards to your peripheral neuropathy please continue gabapentin 100 mg daily.   In regards to your shoulder pain we think this is related to shoulder nerve impingement.  Please start meloxicam 7.5 mg daily for 7 days to see if this improves your symptoms.  If the pain gets worse, becomes constant or does not improve please come back to see [place].   It was nice meeting you!  Thank you,   [name].

## 2018-04-26 NOTE — Assessment & Plan Note (Signed)
Hypertension Assessment: She is currently on HCTZ 25 mg daily and her blood pressure has been well controlled. Blood pressure today was 141/83 with a repeat blood pressure of 122/91, she is at her goal of <130/80. She reports that she checks her blood pressure at home and it is normally around 119/79. She reports that she has been walking on her treadmill up to 30 minutes a day. Patients blood pressure is well controlled and we discussed her diet and exercise regimen. She reports that she has been walking up to 30 minutes a day on her treadmill.   Plan: -Continue HCTZ 25 mg daily -Check TSH -Repeat BMP

## 2018-04-26 NOTE — Assessment & Plan Note (Signed)
Peripheral Neuropathy Assessment: Had burning sensation in feet and pain in hands previously and was placed on gabapentin 100 mg TID. She is only taking it once a day at night and she states that she feels like her symptosm are well controlled. She reports that she rarely gets numbness or tingling in her hands. She reports that she still gets a burning sensation on the top of her feet up to 3 times a day. On exam she had sensation to light touch in her hands and feet.   Plan: -Continue gabapentin 100 mg daily -Can increase up to 2-3 times a day if her symptoms worsen however she reports that it makes her tired so caution with increasing dose

## 2018-04-26 NOTE — Assessment & Plan Note (Addendum)
Assessment: Patient reports that she has not started the nicorette gums, she is smoking around 1/2 pack per day. She has a bad reaction to the patch when she used it and is unable to get chantix due to insurance issues. Today we discussed some of the pros and cons of quitting smoking and she was not interested in setting a stop date at this time. She reports that she will think about that later.   Plan: -Encouraged smoking cessation -Start taking nicorette gum

## 2018-04-26 NOTE — Assessment & Plan Note (Signed)
Shoulder pain: Assessment: In regards to her shoulder pain she reports that it has been going on since her last visit where she got her tetanus shot, she does not know if it was related to the shot just that this is when she started to notice it. She reports that it occurs when she is trying to raise her arm above her head or behind her, it is located on the lateral aspect of her upper arm, is sharp in nature and will last for 10-15 seconds. She reports that she has not tried anything to relieve it since it lasts for such a short time. She denies and pain to palpation and no numbness or tingling in her arm.   On exam she had pain with passive and active movement of her left shoulder, no tenderness to palpation, and a positive left arm empty can test. Her reflexes and sensation was normal.  Plan: -This seems like a shoulder impingement syndrome given her exam findings -Meloxicam 7.5 mg daily for 7 days, can continue at this dose if symptoms improve -If symptoms do not improve or worsen we can consider giving a steroid injection to help relieve her symptoms

## 2018-04-27 LAB — BMP8+ANION GAP
Anion Gap: 14 mmol/L (ref 10.0–18.0)
BUN / CREAT RATIO: 9 (ref 9–23)
BUN: 7 mg/dL (ref 6–24)
CHLORIDE: 100 mmol/L (ref 96–106)
CO2: 24 mmol/L (ref 20–29)
Calcium: 9.3 mg/dL (ref 8.7–10.2)
Creatinine, Ser: 0.8 mg/dL (ref 0.57–1.00)
GFR calc Af Amer: 100 mL/min/{1.73_m2} (ref >59)
GFR calc non Af Amer: 87 mL/min/{1.73_m2} (ref >59)
GLUCOSE: 98 mg/dL (ref 65–99)
POTASSIUM: 4.4 mmol/L (ref 3.5–5.2)
Sodium: 138 mmol/L (ref 134–144)

## 2018-04-27 LAB — TSH: TSH: 7.28 u[IU]/mL — ABNORMAL HIGH (ref 0.450–4.500)

## 2018-04-28 ENCOUNTER — Telehealth: Payer: Self-pay | Admitting: Internal Medicine

## 2018-04-28 DIAGNOSIS — G6289 Other specified polyneuropathies: Secondary | ICD-10-CM

## 2018-04-28 NOTE — Addendum Note (Signed)
Addended by: Aldine Contes on: 04/28/2018 12:51 PM   Modules accepted: Orders

## 2018-04-28 NOTE — Telephone Encounter (Signed)
Called patient about her TSH results and the possibility of her having hypothyroidism, Asked her to return for labs including a TSH and Free T4 level. She reports that she can come in early next week to complete those labs. Will likely start on levothyroxine depending on these results.   She reports that her arm is still having some intermittent pain, she just got the moeloxicam today so she does not know if it is working yet. Told her to call back or RTC if symptoms do not improve. Will re-address when I discuss the repeat thyroid labs.

## 2018-04-29 NOTE — Addendum Note (Signed)
Addended by: Aldine Contes on: 04/29/2018 11:14 AM   Modules accepted: Level of Service

## 2018-04-29 NOTE — Progress Notes (Signed)
Internal Medicine Clinic Attending  I saw and evaluated the patient.  I personally confirmed the key portions of the history and exam documented by Dr. Krienke and I reviewed pertinent patient test results.  The assessment, diagnosis, and plan were formulated together and I agree with the documentation in the resident's note.    

## 2018-05-05 ENCOUNTER — Other Ambulatory Visit (INDEPENDENT_AMBULATORY_CARE_PROVIDER_SITE_OTHER): Payer: Self-pay

## 2018-05-05 DIAGNOSIS — G6289 Other specified polyneuropathies: Secondary | ICD-10-CM

## 2018-05-06 LAB — T4, FREE: Free T4: 1.05 ng/dL (ref 0.82–1.77)

## 2018-05-06 LAB — TSH: TSH: 4.56 u[IU]/mL — ABNORMAL HIGH (ref 0.450–4.500)

## 2018-05-27 ENCOUNTER — Telehealth: Payer: Self-pay | Admitting: Internal Medicine

## 2018-07-02 ENCOUNTER — Other Ambulatory Visit: Payer: Self-pay

## 2018-07-02 DIAGNOSIS — M5431 Sciatica, right side: Secondary | ICD-10-CM

## 2018-07-02 DIAGNOSIS — M25512 Pain in left shoulder: Secondary | ICD-10-CM

## 2018-07-02 NOTE — Telephone Encounter (Signed)
Requesting all meds to be filled @ [facility] on [address].

## 2018-07-04 MED ORDER — MELOXICAM 7.5 MG PO TABS: 7.5000 mg | ORAL_TABLET | Freq: Every day | ORAL | 0 refills | Status: DC

## 2018-07-04 MED ORDER — IBUPROFEN 800 MG PO TABS: 800.0000 mg | ORAL_TABLET | Freq: Three times a day (TID) | ORAL | 3 refills | Status: DC | PRN

## 2018-07-04 MED ORDER — HYDROCHLOROTHIAZIDE 25 MG PO TABS: 25.0000 mg | ORAL_TABLET | Freq: Every day | ORAL | 1 refills | Status: DC

## 2018-08-10 ENCOUNTER — Encounter: Payer: Self-pay | Admitting: Internal Medicine

## 2018-09-03 ENCOUNTER — Other Ambulatory Visit: Payer: Self-pay | Admitting: Internal Medicine

## 2018-09-03 DIAGNOSIS — M25512 Pain in left shoulder: Secondary | ICD-10-CM

## 2018-09-03 NOTE — Telephone Encounter (Signed)
Last visit 04/26/18 w/pcp Next appt: none scheduled Will send to pcp for review.Despina Hidden Cassady2/7/20201:42 PM

## 2018-09-06 NOTE — Telephone Encounter (Signed)
This was prescribed for right shoulder pain, if she is still having pain she should come in to be evaluated. It may be nerve impingement syndrome and she may require a steroid injection.

## 2018-09-09 ENCOUNTER — Other Ambulatory Visit: Payer: Self-pay | Admitting: Internal Medicine

## 2018-09-09 DIAGNOSIS — M25512 Pain in left shoulder: Secondary | ICD-10-CM

## 2018-09-13 ENCOUNTER — Other Ambulatory Visit: Payer: Self-pay | Admitting: Internal Medicine

## 2018-09-13 DIAGNOSIS — M25512 Pain in left shoulder: Secondary | ICD-10-CM

## 2019-03-20 NOTE — Progress Notes (Deleted)
   CC: ***  HPI:  Ms.Daniella Jerilynn Mages Devenny is a 48 y.o.  with a PMH listed below presenting for ***   Patient is on HCTZ 25 mg daily, BP today is ***. BMP on *** showed ***.  -Continue HCTZ 25 mg daily   Please see A&P for status of the patient's chronic medical conditions  Past Medical History:  Diagnosis Date  . Chronic headaches   . Lumbar facet arthropathy   . Sciatica of right side    Review of Systems: Refer to history of present illness and assessment and plans for pertinent review of systems, all others reviewed and negative.  Physical Exam:  There were no vitals filed for this visit. *** Physical Exam  Social History   Socioeconomic History  . Marital status: Single    Spouse name: Not on file  . Number of children: Not on file  . Years of education: Not on file  . Highest education level: Not on file  Occupational History  . Not on file  Social Needs  . Financial resource strain: Not on file  . Food insecurity    Worry: Not on file    Inability: Not on file  . Transportation needs    Medical: Not on file    Non-medical: Not on file  Tobacco Use  . Smoking status: Current Every Day Smoker    Packs/day: 0.25    Years: 20.00    Pack years: 5.00    Types: Cigarettes  . Smokeless tobacco: Never Used  . Tobacco comment: 1/2 PPD  Substance and Sexual Activity  . Alcohol use: No  . Drug use: No  . Sexual activity: Yes    Birth control/protection: Surgical  Lifestyle  . Physical activity    Days per week: Not on file    Minutes per session: Not on file  . Stress: Not on file  Relationships  . Social Herbalist on phone: Not on file    Gets together: Not on file    Attends religious service: Not on file    Active member of club or organization: Not on file    Attends meetings of clubs or organizations: Not on file    Relationship status: Not on file  . Intimate partner violence    Fear of current or ex partner: Not on file    Emotionally  abused: Not on file    Physically abused: Not on file    Forced sexual activity: Not on file  Other Topics Concern  . Not on file  Social History Narrative  . Not on file   *** Family History  Problem Relation Age of Onset  . Diabetes Mellitus II Mother   . Diabetes Mellitus II Father     Assessment & Plan:   See Encounters Tab for problem based charting.  Patient discussed with Dr. Illa Level. Hoffman","Klima","Mullen","Narendra","Raines","Vincent"}

## 2019-03-21 ENCOUNTER — Encounter: Payer: Self-pay | Admitting: Internal Medicine

## 2019-04-18 ENCOUNTER — Ambulatory Visit: Payer: Self-pay | Admitting: Internal Medicine

## 2019-04-18 ENCOUNTER — Other Ambulatory Visit: Payer: Self-pay

## 2019-04-18 VITALS — BP 139/85 | HR 62 | Temp 98.2°F | Wt 232.3 lb

## 2019-04-18 DIAGNOSIS — M6740 Ganglion, unspecified site: Secondary | ICD-10-CM

## 2019-04-18 DIAGNOSIS — F17210 Nicotine dependence, cigarettes, uncomplicated: Secondary | ICD-10-CM

## 2019-04-18 DIAGNOSIS — E039 Hypothyroidism, unspecified: Secondary | ICD-10-CM

## 2019-04-18 DIAGNOSIS — M6748 Ganglion, other site: Secondary | ICD-10-CM

## 2019-04-18 DIAGNOSIS — Z23 Encounter for immunization: Secondary | ICD-10-CM

## 2019-04-18 DIAGNOSIS — I10 Essential (primary) hypertension: Secondary | ICD-10-CM

## 2019-04-18 DIAGNOSIS — Z0000 Encounter for general adult medical examination without abnormal findings: Secondary | ICD-10-CM

## 2019-04-18 DIAGNOSIS — Z79899 Other long term (current) drug therapy: Secondary | ICD-10-CM

## 2019-04-18 DIAGNOSIS — E038 Other specified hypothyroidism: Secondary | ICD-10-CM

## 2019-04-18 DIAGNOSIS — G6289 Other specified polyneuropathies: Secondary | ICD-10-CM

## 2019-04-18 DIAGNOSIS — E02 Subclinical iodine-deficiency hypothyroidism: Secondary | ICD-10-CM

## 2019-04-18 DIAGNOSIS — G629 Polyneuropathy, unspecified: Secondary | ICD-10-CM

## 2019-04-18 NOTE — Progress Notes (Signed)
CC: Hypertension, neuropathy, and right middle finger pain  HPI:  [name] is a 48 y.o.  with a PMH listed below presenting for hypertension, neuropathy, and right middle finger pain.   Please see A&P for status of the patient's chronic medical conditions  Past Medical History:  Diagnosis Date  . Chronic headaches   . Lumbar facet arthropathy   . Sciatica of right side    Review of Systems: Refer to history of present illness and assessment and plans for pertinent review of systems, all others reviewed and negative.  Physical Exam:  Vitals:   04/18/19 1432  BP: 139/85  Pulse: 62  Temp: 98.2 F (36.8 C)  TempSrc: Oral  SpO2: 100%  Weight: 232 lb 4.8 oz (105.4 kg)    Physical Exam  Constitutional: She is oriented to person, place, and time and well-developed, well-nourished, and in no distress.  HENT:  Head: Normocephalic and atraumatic.  Neck: Normal range of motion. Neck supple.  Cardiovascular: Normal rate, regular rhythm and normal heart sounds.  Pulmonary/Chest: Effort normal and breath sounds normal. No respiratory distress.  Abdominal: Soft. Bowel sounds are normal. She exhibits no distension.  Musculoskeletal: Normal range of motion.     Comments: Small, approximately 6 mm, mobile, fluctuant mass at the base of the right 3rd phalanx  Neurological: She is alert and oriented to person, place, and time.  Skin: Skin is warm and dry.  Psychiatric: Mood and affect normal.    Social History   Socioeconomic History  . Marital status: Single    Spouse name: Not on file  . Number of children: Not on file  . Years of education: Not on file  . Highest education level: Not on file  Occupational History  . Not on file  Social Needs  . Financial resource strain: Not on file  . Food insecurity    Worry: Not on file    Inability: Not on file  . Transportation needs    Medical: Not on file    Non-medical: Not on file  Tobacco Use  . Smoking status:  Current Every Day Smoker    Packs/day: 0.25    Years: 20.00    Pack years: 5.00    Types: Cigarettes  . Smokeless tobacco: Never Used  . Tobacco comment: 1/2 PPD  Substance and Sexual Activity  . Alcohol use: No  . Drug use: No  . Sexual activity: Yes    Birth control/protection: Surgical  Lifestyle  . Physical activity    Days per week: Not on file    Minutes per session: Not on file  . Stress: Not on file  Relationships  . Social Herbalist on phone: Not on file    Gets together: Not on file    Attends religious service: Not on file    Active member of club or organization: Not on file    Attends meetings of clubs or organizations: Not on file    Relationship status: Not on file  . Intimate partner violence    Fear of current or ex partner: Not on file    Emotionally abused: Not on file    Physically abused: Not on file    Forced sexual activity: Not on file  Other Topics Concern  . Not on file  Social History Narrative  . Not on file    Family History  Problem Relation Age of Onset  . Diabetes Mellitus II Mother   . Diabetes Mellitus  II Father     Assessment & Plan:   See Encounters Tab for problem based charting.  Patient discussed with Dr. Philipp Ovens

## 2019-04-18 NOTE — Patient Instructions (Signed)
[name],  It was a pleasure to see you today. Thank you for coming in.   Today we discussed your blood pressure, hand pain, and previous abnormal labs.   You can stop taking the HCTZ for now, please continue checking your blood pressure and contact us if it is elevated.    We also discussed your hand pain. This looks like a ganglion cyst, this is a small collection of fluid that can collect in your joints. About 50% of them will resolve on their own. If the pain worsens or it becomes larger we can refer you to a specialist that can further treat it.    Please return to clinic in 6 months or sooner if needed.   Thank you again for coming in.   [name].

## 2019-04-19 DIAGNOSIS — E038 Other specified hypothyroidism: Secondary | ICD-10-CM | POA: Insufficient documentation

## 2019-04-19 DIAGNOSIS — E039 Hypothyroidism, unspecified: Secondary | ICD-10-CM | POA: Insufficient documentation

## 2019-04-19 DIAGNOSIS — M6740 Ganglion, unspecified site: Secondary | ICD-10-CM | POA: Insufficient documentation

## 2019-04-19 LAB — T4, FREE: Free T4: 0.89 ng/dL (ref 0.82–1.77)

## 2019-04-19 LAB — LIPID PANEL
Chol/HDL Ratio: 4.7 ratio — ABNORMAL HIGH (ref 0.0–4.4)
Cholesterol, Total: 204 mg/dL — ABNORMAL HIGH (ref 100–199)
HDL: 43 mg/dL (ref >39)
LDL Chol Calc (NIH): 143 mg/dL — ABNORMAL HIGH (ref 0–99)
Triglycerides: 100 mg/dL (ref 0–149)
VLDL Cholesterol Cal: 18 mg/dL (ref 5–40)

## 2019-04-19 LAB — TSH: TSH: 5.83 u[IU]/mL — ABNORMAL HIGH (ref 0.450–4.500)

## 2019-04-19 NOTE — Assessment & Plan Note (Signed)
Patient has a history of subclinical hypothyroidism.  She reports that she has some mild hot flashes every few days denies any weight changes, skin changes, hair loss, fatigue, or trouble swallowing. No evidence of large thyroid on exam. 05/05/18 TSH was 4.5, T4 1.05. Will repeat thyroid levels today.   -Check TSH and free T4

## 2019-04-19 NOTE — Assessment & Plan Note (Signed)
Patient received flu vaccine, ordered mammogram.

## 2019-04-19 NOTE — Assessment & Plan Note (Signed)
Patient reports that she is continuing to have some bilateral hand and feet pain, burning sensation.  She also reports some numbness and tingling in the area. Improves with gabapentin.  No evidence of diabetes at this time.  -Continue gabapentin 100 mg daily

## 2019-04-19 NOTE — Assessment & Plan Note (Signed)
Patient reports that she does not take her hydrochlorothiazide every day, only takes it about 2-3 times per week.  She states that she does not like the increased urination that it causes.  She does check her blood pressures on the other days and it is always around 120/79.  She also reports that her blood pressures are always elevated when she comes to the doctor.  Blood pressure today was 139/62.  Since she has not taken her blood pressure medications at home and was reporting a normal range at home we discussed holding her blood pressure medication for now and monitoring.  She is in agreement with this.  -Discontinue hydrochlorothiazide -Advised patient to monitor blood pressures at home, contact us if they become elevated

## 2019-04-19 NOTE — Assessment & Plan Note (Signed)
Patient reports that she has been having right lateral finger pain for a few weeks now, states that it feels like it is out of place, it is tender near the base of her middle finger.  She reports that it does not feel like it is broken but just very tender.  She denies excessive use of her fingers, does not spend a lot of time on the computer or doing other activities. Denies any trauma to the area. On exam she does have a small, approximately 6 mm, mobile, fluctuant mass at the base of the right 3rd phalanx, mild tenderness to palpation. Exam appears consistent with a ganglion cyst, it is deep and difficult to transilluminate.  Discussed that 50% of these can spontaneously resolve. Discussed that if the area enlarges or she starts developing significant pain or other symptoms we can refer her to a hand specialist to drain. She is in agreement with this plan.

## 2019-04-20 NOTE — Progress Notes (Signed)
Internal Medicine Clinic Attending  I saw and evaluated the patient.  I personally confirmed the key portions of the history and exam documented by Dr. Krienke and I reviewed pertinent patient test results.  The assessment, diagnosis, and plan were formulated together and I agree with the documentation in the resident's note.    

## 2019-07-14 ENCOUNTER — Other Ambulatory Visit: Payer: Self-pay | Admitting: Internal Medicine

## 2019-07-14 DIAGNOSIS — M5431 Sciatica, right side: Secondary | ICD-10-CM

## 2019-07-15 ENCOUNTER — Other Ambulatory Visit: Payer: Self-pay | Admitting: Internal Medicine

## 2019-07-15 DIAGNOSIS — M5431 Sciatica, right side: Secondary | ICD-10-CM

## 2019-07-18 NOTE — Telephone Encounter (Signed)
Needs refill on ibuprofen (ADVIL,MOTRIN) 800 MG tablet  ;pt contact [place], [place].

## 2020-02-28 ENCOUNTER — Other Ambulatory Visit: Payer: Self-pay

## 2020-02-28 ENCOUNTER — Other Ambulatory Visit: Payer: Self-pay | Admitting: Internal Medicine

## 2020-02-28 ENCOUNTER — Ambulatory Visit: Payer: Self-pay | Admitting: Internal Medicine

## 2020-02-28 ENCOUNTER — Encounter: Payer: Self-pay | Admitting: Internal Medicine

## 2020-02-28 VITALS — BP 123/85 | HR 64 | Temp 98.0°F | Ht 69.0 in | Wt 243.8 lb

## 2020-02-28 DIAGNOSIS — E039 Hypothyroidism, unspecified: Secondary | ICD-10-CM

## 2020-02-28 DIAGNOSIS — R61 Generalized hyperhidrosis: Secondary | ICD-10-CM

## 2020-02-28 DIAGNOSIS — E038 Other specified hypothyroidism: Secondary | ICD-10-CM

## 2020-02-28 DIAGNOSIS — F17210 Nicotine dependence, cigarettes, uncomplicated: Secondary | ICD-10-CM

## 2020-02-28 DIAGNOSIS — I10 Essential (primary) hypertension: Secondary | ICD-10-CM

## 2020-02-28 DIAGNOSIS — M5431 Sciatica, right side: Secondary | ICD-10-CM

## 2020-02-28 DIAGNOSIS — Z720 Tobacco use: Secondary | ICD-10-CM

## 2020-02-28 DIAGNOSIS — Z1231 Encounter for screening mammogram for malignant neoplasm of breast: Secondary | ICD-10-CM

## 2020-02-28 DIAGNOSIS — R232 Flushing: Secondary | ICD-10-CM

## 2020-02-28 MED ORDER — BUPROPION HCL ER (SR) 150 MG PO TB12: ORAL_TABLET | ORAL | 5 refills | Status: DC

## 2020-02-28 MED ORDER — GABAPENTIN 100 MG PO CAPS: 100.0000 mg | ORAL_CAPSULE | Freq: Every day | ORAL | 3 refills | Status: DC

## 2020-02-28 MED ORDER — IBUPROFEN 800 MG PO TABS: 800.0000 mg | ORAL_TABLET | Freq: Three times a day (TID) | ORAL | 0 refills | Status: DC | PRN

## 2020-02-28 MED FILL — BUPROPION HCL SR 150 MG TAB: 150 | 30 days supply | Qty: 60 | Fill #0

## 2020-02-28 MED FILL — IBUPROFEN 800 MG TABS: 800 | 10 days supply | Qty: 30 | Fill #0

## 2020-02-28 MED FILL — GABAPENTIN 100 MG CAPSULE: 100 | 30 days supply | Qty: 30 | Fill #0

## 2020-02-28 NOTE — Progress Notes (Signed)
   CC: Hot flashes, tobacco use, hypertension  HPI:  Ms.Illene Jerilynn Mages Mousel is a 49 y.o. with history listed below presenting for her hot flashes, tobacco use, and hypertension.  Past Medical History:  Diagnosis Date  . Chronic headaches   . Lumbar facet arthropathy   . Sciatica of right side    Review of Systems:   Constitutional: Negative for chills and fever. Positive for hot flashes.  Respiratory: Negative for shortness of breath.   Cardiovascular: Negative for chest pain and leg swelling.  Gastrointestinal: Negative for abdominal pain, nausea and vomiting.  Neurological: Negative for dizziness and headaches.   Physical Exam:  Vitals:   02/28/20 1421  BP: 123/85  Pulse: 64  Temp: 98 F (36.7 C)  TempSrc: Oral  SpO2: 100%  Weight: 243 lb 12.8 oz (110.6 kg)  Height: 5\' 9"  (1.753 m)   Physical Exam Constitutional:      Appearance: Normal appearance.  Cardiovascular:     Rate and Rhythm: Normal rate and regular rhythm.     Pulses: Normal pulses.     Heart sounds: Normal heart sounds.  Pulmonary:     Effort: Pulmonary effort is normal.     Breath sounds: Normal breath sounds.  Abdominal:     General: Abdomen is flat.     Palpations: Abdomen is soft.  Skin:    General: Skin is warm and dry.     Capillary Refill: Capillary refill takes less than 2 seconds.  Neurological:     General: No focal deficit present.     Mental Status: She is alert and oriented to person, place, and time.  Psychiatric:        Mood and Affect: Mood normal.        Behavior: Behavior normal.    Assessment & Plan:   See Encounters Tab for problem based charting.  Patient discussed with Dr. Dareen Piano

## 2020-02-28 NOTE — Patient Instructions (Addendum)
[name],  It was a pleasure to see you today. Thank you for coming in.   Today we discussed your hot flashes. This seems to be related to menopause. I am checking some labs to evalaute for other causes and will contact you if they are abnormal. Please continue taking the gabapentin, you can take this daily to see if it may help with your symptoms.   We also discussed your smoking. I am glad that you are interested in quitting. You can start taking Burpoprion 150 mg daily for 3 days, then increase it to 150 mg twice a day after that.    Please return to clinic in 6 months or sooner if needed.   Thank you again for coming in.   Lonia Skinner M.D.  Menopause and Hormone Replacement Therapy Menopause is a normal time of life when menstrual periods stop completely and the ovaries stop producing the female hormones estrogen and progesterone. This lack of hormones can affect your health and cause undesirable symptoms. Hormone replacement therapy (HRT) can relieve some of those symptoms. What is hormone replacement therapy? HRT is the use of artificial (synthetic) hormones to replace hormones that your body has stopped producing because you have reached menopause. What are my options for HRT?  HRT may consist of the synthetic hormones estrogen and progestin, or it may consist of only estrogen (estrogen-only therapy). You and your health care provider will decide which form of HRT is best for you. If you choose to be on HRT and you have a uterus, estrogen and progestin are usually prescribed. Estrogen-only therapy is used for women who do not have a uterus. Possible options for taking HRT include:  Pills.  Patches.  Gels.  Sprays.  Vaginal cream.  Vaginal rings.  Vaginal inserts. The amount of hormone(s) that you take and how long you take the hormone(s) varies according to your health. It is important to:  Begin HRT with the lowest possible dosage.  Stop HRT as soon  as your health care provider tells you to stop.  Work with your health care provider so that you feel informed and comfortable with your decisions. What are the benefits of HRT? HRT can reduce the frequency and severity of menopausal symptoms. Benefits of HRT vary according to the kind of symptoms that you have, how severe they are, and your overall health. HRT may help to improve the following symptoms of menopause:  Hot flashes and night sweats. These are sudden feelings of heat that spread over the face and body. The skin may turn red, like a blush. Night sweats are hot flashes that happen while you are sleeping or trying to sleep.  Bone loss (osteoporosis). The body loses calcium more quickly after menopause, causing the bones to become weaker. This can increase the risk for bone breaks (fractures).  Vaginal dryness. The lining of the vagina can become thin and dry, which can cause pain during sex or cause infection, burning, or itching.  Urinary tract infections.  Urinary incontinence. This is the inability to control when you pass urine.  Irritability.  Short-term memory problems. What are the risks of HRT? Risks of HRT vary depending on your individual health and medical history. Risks of HRT also depend on whether you receive both estrogen and progestin or you receive estrogen only. HRT may increase the risk of:  Spotting. This is when a small amount of blood leaks from the vagina unexpectedly.  Endometrial cancer. This cancer is in the  lining of the uterus (endometrium).  Breast cancer.  Increased density of breast tissue. This can make it harder to find breast cancer on a breast X-ray (mammogram).  Stroke.  Heart disease.  Blood clots.  Gallbladder disease.  Liver disease. Risks of HRT can increase if you have any of the following conditions:  Endometrial cancer.  Liver disease.  Heart disease.  Breast cancer.  History of blood clots.  History of  stroke. Follow these instructions at home:  Take over-the-counter and prescription medicines only as told by your health care provider.  Get mammograms, pelvic exams, and medical checkups as often as told by your health care provider.  Have Pap tests done as often as told by your health care provider. A Pap test is sometimes called a Pap smear. It is a screening test that is used to check for signs of cancer of the cervix and vagina. A Pap test can also identify the presence of infection or precancerous changes. Pap tests may be done: ? Every 3 years, starting at age 55. ? Every 5 years, starting after age 35, in combination with testing for human papillomavirus (HPV). ? More often or less often depending on other medical conditions you have, your age, and other risk factors.  It is up to you to get the results of your Pap test. Ask your health care provider, or the department that is doing the test, when your results will be ready.  Keep all follow-up visits as told by your health care provider. This is important. Contact a health care provider if you have:  Pain or swelling in your legs.  Shortness of breath.  Chest pain.  Lumps or changes in your breasts or armpits.  Slurred speech.  Pain, burning, or bleeding when you urinate.  Unusual vaginal bleeding.  Dizziness or headaches.  Weakness or numbness in any part of your arms or legs.  Pain in your abdomen. Summary  Menopause is a normal time of life when menstrual periods stop completely and the ovaries stop producing the female hormones estrogen and progesterone.  Hormone replacement therapy (HRT) can relieve some of the symptoms of menopause.  HRT can reduce the frequency and severity of menopausal symptoms.  Risks of HRT vary depending on your individual health and medical history. This information is not intended to replace advice given to you by your health care provider. Make sure you discuss any questions you  have with your health care provider. Document Revised: 03/16/2018 Document Reviewed: 03/16/2018 Elsevier Patient Education  2020 [employer].

## 2020-02-29 DIAGNOSIS — R232 Flushing: Secondary | ICD-10-CM

## 2020-02-29 HISTORY — DX: Flushing: R23.2

## 2020-02-29 LAB — T4, FREE: Free T4: 0.91 ng/dL (ref 0.82–1.77)

## 2020-02-29 LAB — TSH: TSH: 5.04 u[IU]/mL — ABNORMAL HIGH (ref 0.450–4.500)

## 2020-02-29 LAB — T3: T3, Total: 117 ng/dL (ref 71–180)

## 2020-02-29 NOTE — Assessment & Plan Note (Signed)
Patient currently smokes half a pack per day, she reports that she is going to try to quit by 2022.  She previously had tried to pick up that Chantix however this was too expensive.  She is thinking about the nicotine gum.  We discussed bupropion as an option, discussed the risks and benefits.  Informed her that this medication is sometimes used for depression.  She is agreeable to trying this medication.  -Bupropion 150 mg daily x3-day, then 150 mg twice daily

## 2020-02-29 NOTE — Assessment & Plan Note (Signed)
Patient is currently not on any medications, she had previously been on hydrochlorothiazide however this was stopped due to having well-controlled blood pressures and developing frequent urination.  Blood pressure today is 123/85.  Who need to start any medications at this time.

## 2020-02-29 NOTE — Assessment & Plan Note (Addendum)
Patient reports that she has been having hot flashes and night sweats that have been going on for the past few years, however seems to be more frequent and occurring almost nightly now.  She has been using fans at night to help with the night sweats.  She reports some mild weight gain.  Denies any shortness of breath, chest pain, hair loss, dry skin, weight loss, nausea, vomiting, abdominal pain, constipation, diarrhea, or other symptoms.  She is still having menstrual cycles, now occurring every 2 to 3 months, last for 4 to 5 days at a time, does feel like it is decreasing in frequency.  Physical exam and vitals are unremarkable.  Overall her symptoms are concerning for premenopause.  She does have history of subclinical hypothyroidism so we will obtain repeat thyroid labs to evaluate.  We discussed treatment options for menopause including hormonal therapy, she reported that it does is not that bothersome at this time but would like information.  She is also on gabapentin for peripheral neuropathy, discussed that gabapentin can sometimes be used for her menopausal symptoms and advised to try to take nightly.  -Provided information on hormonal replacement therapy -Check TSH, free T4, and T3

## 2020-02-29 NOTE — Progress Notes (Signed)
Internal Medicine Clinic Attending ° °Case discussed with Dr. Krienke  At the time of the visit.  We reviewed the resident’s history and exam and pertinent patient test results.  I agree with the assessment, diagnosis, and plan of care documented in the resident’s note.  °

## 2020-03-07 ENCOUNTER — Other Ambulatory Visit: Payer: Self-pay

## 2020-03-07 DIAGNOSIS — N6320 Unspecified lump in the left breast, unspecified quadrant: Secondary | ICD-10-CM

## 2020-04-03 ENCOUNTER — Other Ambulatory Visit: Payer: Self-pay

## 2020-04-03 ENCOUNTER — Ambulatory Visit: Payer: Self-pay

## 2020-04-17 ENCOUNTER — Ambulatory Visit: Payer: Self-pay | Admitting: *Deleted

## 2020-04-17 ENCOUNTER — Other Ambulatory Visit: Payer: Self-pay

## 2020-04-17 VITALS — BP 130/85 | Temp 97.7°F | Wt 241.1 lb

## 2020-04-17 DIAGNOSIS — Z1211 Encounter for screening for malignant neoplasm of colon: Secondary | ICD-10-CM

## 2020-04-17 DIAGNOSIS — Z124 Encounter for screening for malignant neoplasm of cervix: Secondary | ICD-10-CM

## 2020-04-17 NOTE — Patient Instructions (Signed)
Explained breast self awareness with [name]. Pap smear completed today. Let patient know [facility]CP will cover Pap smears and HPV typing every 5 years unless has a history of abnormal Pap smears. Referred patient to the [facility] for a diagnostic mammogram per recommendation. Appointment scheduled Thursday, April 19, 2020 at 0910. Patient aware of appointment and will be there. Let patient know will follow up with her within the next couple weeks with results of Pap smear by letter or phone. Discussed smoking cessation with patient. Referred to the [facility] Quitline and gave resources to the free smoking cessation classes at [facility]. [facility] [employer] [place] verbalized understanding.  Niyana Chesbro, Arvil Chaco, RN 2:33 PM

## 2020-04-17 NOTE — Progress Notes (Signed)
Maureen Reyes is a 49 y.o. [phone] female who presents to [facility] clinic today with no complaints.    Pap Smear: Pap smear completed today. Last Pap smear was 11/16/2014 at [facility] clinic and was normal with negative HPV. Patients previous Pap smear was 05/15/2014 at [facility] and negative with cellular changes consistent with hyperkeratosis with negative HPV. Per patient has no history of an abnormal Pap smear. Last two Pap smear results are in EPIC.   Physical exam: Breasts Breasts symmetrical. No skin abnormalities bilateral breasts. No nipple retraction bilateral breasts. No nipple discharge bilateral breasts. No lymphadenopathy. No lumps palpated bilateral breasts. No complaints of pain or tenderness on exam.      Pelvic/Bimanual Ext Genitalia No lesions, no swelling and no discharge observed on external genitalia.        Vagina Vagina pink and normal texture. No lesions or discharge observed in vagina.        Cervix Cervix is present. Cervix pink and of normal texture. No discharge observed.    Uterus Uterus is present and palpable. Uterus in normal position and normal size.        Adnexae Bilateral ovaries present and palpable. No tenderness on palpation.         Rectovaginal No rectal exam completed today since patient had no rectal complaints. No skin abnormalities observed on exam.     Smoking History: Patient is a current smoker. Discussed smoking cessation with patient. Referred to the [facility] Quitline and gave resources to the free smoking cessation classes at [facility].   Patient Navigation: Patient education provided. Access to services provided for patient through [facility] program.  Colorectal Cancer Screening: Per patient has never had colonoscopy completed. No complaints today. FIT Test given to patient to complete and return to B[facility]CP.    Breast and Cervical Cancer Risk Assessment: Patient does not have family history of breast cancer, known genetic  mutations, or radiation treatment to the chest before age 4. Patient does not have history of cervical dysplasia, immunocompromised, or DES exposure in-utero.  Risk Assessment    Risk Scores      04/17/2020   Last edited by: Royston Bake, CMA   5-year risk: 1.2 %   Lifetime risk: 8.5 %          A: B[facility]CP exam with pap smear Patient referred to [facility] by the [facility] due to a diagnostic mammogram being recommended in 1 year. Last left breast ultrasound was completed 01/25/2015.  P: Referred patient to the [facility] for a diagnostic mammogram per recommendation. Appointment scheduled Thursday, April 19, 2020 at 0910.  Loletta Parish, RN 04/17/2020 2:34 PM

## 2020-04-18 LAB — CYTOLOGY - PAP
Comment: NEGATIVE
Diagnosis: NEGATIVE
High risk HPV: NEGATIVE

## 2020-04-19 ENCOUNTER — Other Ambulatory Visit: Payer: Self-pay

## 2020-04-19 ENCOUNTER — Ambulatory Visit
Admission: RE | Admit: 2020-04-19 | Discharge: 2020-04-19 | Disposition: A | Discharge status: Home / Self Care | Payer: No Typology Code available for payment source | Source: Ambulatory Visit | Attending: Obstetrics and Gynecology | Admitting: Obstetrics and Gynecology

## 2020-04-19 DIAGNOSIS — N6320 Unspecified lump in the left breast, unspecified quadrant: Secondary | ICD-10-CM

## 2020-04-23 MED FILL — BUPROPION HCL SR 150 MG TAB: 150 | 30 days supply | Qty: 60 | Fill #1

## 2020-06-12 MED FILL — BUPROPION HCL SR 150 MG TAB: 150 | 30 days supply | Qty: 60 | Fill #2

## 2020-08-14 ENCOUNTER — Encounter: Payer: No Typology Code available for payment source | Admitting: Student

## 2020-09-27 MED FILL — BUPROPION HCL SR 150 MG TAB: 150 | 30 days supply | Qty: 60 | Fill #3

## 2020-11-26 ENCOUNTER — Encounter: Payer: Self-pay | Admitting: Internal Medicine

## 2020-11-26 ENCOUNTER — Ambulatory Visit: Payer: Self-pay | Admitting: Internal Medicine

## 2020-11-26 VITALS — BP 152/91 | HR 55 | Temp 98.0°F | Wt 254.7 lb

## 2020-11-26 DIAGNOSIS — G8929 Other chronic pain: Secondary | ICD-10-CM

## 2020-11-26 DIAGNOSIS — G6289 Other specified polyneuropathies: Secondary | ICD-10-CM

## 2020-11-26 DIAGNOSIS — M5431 Sciatica, right side: Secondary | ICD-10-CM

## 2020-11-26 DIAGNOSIS — E669 Obesity, unspecified: Secondary | ICD-10-CM

## 2020-11-26 DIAGNOSIS — I10 Essential (primary) hypertension: Secondary | ICD-10-CM

## 2020-11-26 DIAGNOSIS — M545 Low back pain, unspecified: Secondary | ICD-10-CM

## 2020-11-26 MED ORDER — GABAPENTIN 100 MG PO CAPS
100.0000 mg | ORAL_CAPSULE | Freq: Every day | ORAL | 3 refills | Status: DC
Start: 2020-11-26 — End: 2022-07-15

## 2020-11-26 MED ORDER — IBUPROFEN 800 MG PO TABS: 800.0000 mg | ORAL_TABLET | Freq: Three times a day (TID) | ORAL | 0 refills | Status: DC | PRN

## 2020-11-26 NOTE — Assessment & Plan Note (Signed)
Patient has a history of peripheral neuropathy currently on gabapentin 100 mg daily.  She had previously had work-up including B12, MMA, HIV, and RPR which were all negative.  She states that her symptoms are currently well controlled with gabapentin.  There has been discussion in the past about neurology referral for nerve conduction studies however patient is currently uninsured.  Discussed that we can consider referral with future she gets the orange card.

## 2020-11-26 NOTE — Assessment & Plan Note (Signed)
Patient states that she has been having lower back pain for a while, she had a traumatic injury about 17 years ago where he watched TV fell on her back.  She states that it is on the lower back, occurs daily, improves with ibuprofen and massage and with exercise.  Is worse at night. Denies any numbness, tingling, radiation, weakness, or other symptoms. She does not have any red flag symptoms.  On exam she has no tenderness to palpation, strength, sensation, and reflexes were intact lower extremities.  Patient does have a BMI of 37 which could be contributing to her chronic lower back pain.   -Refill ibuprofen -Provided information on back stretches -Advised on weight loss and exercise regimen

## 2020-11-26 NOTE — Progress Notes (Signed)
   CC: Hypertension, chronic back pain, smoking, weight gain, and peripheral neuropathy  HPI:  [name] is a 50 y.o. with a history of hypertension, tobacco abuse, peripheral neuropathy, and chronic headaches who is presenting for evaluation of the above conditions. She is accompanied by her husband.   Past Medical History:  Diagnosis Date  . Chronic headaches   . Lumbar facet arthropathy   . Sciatica of right side    Review of Systems:   Constitutional: Negative for chills and fever. Positive for weight gain.  Respiratory: Negative for shortness of breath.   Cardiovascular: Negative for chest pain and leg swelling.  Gastrointestinal: Negative for abdominal pain, nausea and vomiting.  MSK: Positive for lower back pain. Neurological: Negative for dizziness and headaches.   Physical Exam:  Vitals:   11/26/20 1031 11/26/20 1110  BP: (!) 158/85 (!) 152/91  Pulse: 72 (!) 55  Temp: 98 F (36.7 C)   SpO2: 100%   Weight: 254 lb 11.2 oz (115.5 kg)    Physical Exam Constitutional:      Appearance: Normal appearance.  HENT:     Head: Normocephalic and atraumatic.     Mouth/Throat:     Mouth: Mucous membranes are moist.     Pharynx: Oropharynx is clear.  Eyes:     Extraocular Movements: Extraocular movements intact.     Conjunctiva/sclera: Conjunctivae normal.     Pupils: Pupils are equal, round, and reactive to light.  Cardiovascular:     Rate and Rhythm: Normal rate and regular rhythm.     Pulses: Normal pulses.     Heart sounds: Normal heart sounds.  Pulmonary:     Effort: Pulmonary effort is normal.     Breath sounds: Normal breath sounds.  Abdominal:     General: Abdomen is flat. Bowel sounds are normal.     Palpations: Abdomen is soft.  Musculoskeletal:        General: No tenderness or deformity. Normal range of motion.     Right lower leg: No edema.     Left lower leg: No edema.  Skin:    Capillary Refill: Capillary refill takes less than 2 seconds.   Neurological:     General: No focal deficit present.     Mental Status: She is alert and oriented to person, place, and time.     Sensory: No sensory deficit.     Motor: No weakness.     Deep Tendon Reflexes: Reflexes normal.  Psychiatric:        Mood and Affect: Mood normal.        Behavior: Behavior normal.     Assessment & Plan:   See Encounters Tab for problem based charting.  Patient discussed with Dr. Dareen Piano

## 2020-11-26 NOTE — Assessment & Plan Note (Signed)
Patient states that she had about a 20 pound weight gain since December of last year, this was after she had successfully quit smoking.  She states that her weight does fluctuate however has been increasing recently, she does use some over-the-counter medication however does not know what it is called.  She has not monitored her diet, only eat fast food, history, sweets, candies, and does not eat vegetables.  She occasionally will work out on a treadmill, about 2-3 times a week she will walk for about 10 to 15 minutes, she does not get sweaty or feel like her heart is racing when she does this.  We discussed that first-line treatments for weight gain is lifestyle modifications, discussed some dietary modifications that she can make, provided information on application such as my fitness pal to monitor her caloric intake.  Also advised to increase her exercise regimen and intensity and time.  Discussed that we will start with lifestyle modifications for now.   -Provided information on weight management strategies

## 2020-11-26 NOTE — Assessment & Plan Note (Signed)
Patient's blood pressure is elevated at 158/85, repeat was 152/92.  She had previously been on hydrochlorothiazide past however is not currently on any blood pressure medications.  She denies any headaches, lightheadedness, chest pain, shortness of breath, blurry vision, or other symptoms. She recently quit smoking and has been having some weight gain, and also takes ibuprofen as needed for her lower back pain.  She reports eating out a lot, occasionally will has fast food, he is cream, sweets, candies, and other junk food.  She also reports working out about 2-3 times per week, for about 10 to 15 minutes on the treadmill, she states that she does not work-up a sweat when she does this.  We discussed the importance of getting good blood pressure control including decreasing risk of heart attacks, strokes, kidney injury, and other complications.  I recommended starting a blood pressure medication such as lisinopril however patient did not do this.  We discussed working on lifestyle modifications including preventing the DASH diet and increasing exercise.  She states that she will monitor her blood pressures at home and bring in the log on her next visit. -Follow-up in 3 months -Obtain BMP -Provided information on DASH diet and exercise options -If BP still elevated on next visit, consider starting medication such as lisinopril

## 2020-11-26 NOTE — Patient Instructions (Addendum)
Ms. SAVAHANNA ALMENDARIZ,  It was a pleasure to see you today. Thank you for coming in.   Today we discussed your blood pressure. This was a little elevated today. In regards to this please start working on your diet and exercise. I have given you some information regarding the DASH diet. Please keep a monitor of your blood pressures at home and bring it on your next visit. We will hold off on any medications for now however if it remains elevated we may need to start something in the future.   We also discussed your back pain. Please try keeping a regular exercise schedule. You can use ibuprofen as needed however try to limit this because this can cause issues with your kidneys if you use this too often. I have given you some information on some stretches that you can do.   We also discussed your weight gain. The most important thing is to work on lifestyle changes. Please look at your diet and caloric intake. You can use applications such as MyFitnessPal to track this. Please work on increasing your exercise regimen, the goal is to do 150 minutes per week, this is about 5 30 minute sessions.   Please return to clinic in 3 months or sooner if needed.   Thank you again for coming in.   [name].

## 2020-11-27 LAB — BMP8+ANION GAP
Anion Gap: 19 mmol/L — ABNORMAL HIGH (ref 10.0–18.0)
BUN/Creatinine Ratio: 10 (ref 9–23)
BUN: 8 mg/dL (ref 6–24)
CO2: 21 mmol/L (ref 20–29)
Calcium: 9.4 mg/dL (ref 8.7–10.2)
Chloride: 102 mmol/L (ref 96–106)
Creatinine, Ser: 0.81 mg/dL (ref 0.57–1.00)
Glucose: 123 mg/dL — ABNORMAL HIGH (ref 65–99)
Potassium: 4.5 mmol/L (ref 3.5–5.2)
Sodium: 142 mmol/L (ref 134–144)
eGFR: 88 mL/min/{1.73_m2} (ref >59)

## 2020-11-29 NOTE — Progress Notes (Signed)
Internal Medicine Clinic Attending ° °Case discussed with Dr. Krienke  At the time of the visit.  We reviewed the resident’s history and exam and pertinent patient test results.  I agree with the assessment, diagnosis, and plan of care documented in the resident’s note.  °

## 2020-12-18 ENCOUNTER — Other Ambulatory Visit (HOSPITAL_COMMUNITY): Payer: Self-pay

## 2020-12-22 ENCOUNTER — Encounter: Payer: Self-pay | Admitting: *Deleted

## 2021-01-29 ENCOUNTER — Encounter: Payer: Self-pay | Admitting: *Deleted

## 2021-02-12 ENCOUNTER — Other Ambulatory Visit (HOSPITAL_COMMUNITY): Payer: Self-pay

## 2021-04-10 ENCOUNTER — Other Ambulatory Visit: Payer: Self-pay

## 2021-04-10 DIAGNOSIS — Z1231 Encounter for screening mammogram for malignant neoplasm of breast: Secondary | ICD-10-CM

## 2021-04-11 ENCOUNTER — Other Ambulatory Visit: Payer: Self-pay | Admitting: Obstetrics and Gynecology

## 2021-04-11 DIAGNOSIS — Z1231 Encounter for screening mammogram for malignant neoplasm of breast: Secondary | ICD-10-CM

## 2021-04-29 ENCOUNTER — Encounter: Payer: No Typology Code available for payment source | Admitting: Internal Medicine

## 2021-05-23 ENCOUNTER — Ambulatory Visit: Payer: No Typology Code available for payment source

## 2021-05-28 ENCOUNTER — Ambulatory Visit: Payer: Self-pay | Admitting: *Deleted

## 2021-05-28 ENCOUNTER — Ambulatory Visit
Admission: RE | Admit: 2021-05-28 | Discharge: 2021-05-28 | Disposition: A | Discharge status: Home / Self Care | Payer: No Typology Code available for payment source | Source: Ambulatory Visit | Attending: Obstetrics and Gynecology | Admitting: Obstetrics and Gynecology

## 2021-05-28 ENCOUNTER — Other Ambulatory Visit: Payer: Self-pay

## 2021-05-28 VITALS — BP 136/82 | Wt 255.5 lb

## 2021-05-28 DIAGNOSIS — Z1239 Encounter for other screening for malignant neoplasm of breast: Secondary | ICD-10-CM

## 2021-05-28 DIAGNOSIS — Z1231 Encounter for screening mammogram for malignant neoplasm of breast: Secondary | ICD-10-CM

## 2021-05-28 DIAGNOSIS — Z1211 Encounter for screening for malignant neoplasm of colon: Secondary | ICD-10-CM

## 2021-05-28 NOTE — Progress Notes (Signed)
[name] is a 50 y.o. female who presents to [facility] clinic today with no complaints.    Pap Smear: Pap smear not completed today. Last Pap smear was 04/17/2020 at [facility] clinic and was normal with negative HPV. Per patient has no history of an abnormal Pap smear. Last Pap smear result is available in Epic.   Physical exam: Breasts Breasts symmetrical. No skin abnormalities bilateral breasts. No nipple retraction bilateral breasts. No nipple discharge bilateral breasts. No lymphadenopathy. No lumps palpated bilateral breasts. No complaints of pain or tenderness on exam.  MS DIGITAL DIAG TOMO BILAT  Result Date: 04/19/2020 CLINICAL DATA:  Patient presents for delayed follow-up of probably benign left breast mass. EXAM: DIGITAL DIAGNOSTIC BILATERAL MAMMOGRAM WITH CAD AND TOMO ULTRASOUND LEFT BREAST COMPARISON:  Previous exam(s). ACR Breast Density Category c: The breast tissue is heterogeneously dense, which may obscure small masses. FINDINGS: Within the inferior left breast middle depth there is a persistent oval circumscribed mass, stable in size compared to prior exams. No new masses, calcifications or nonsurgical distortion identified within either breast. Mammographic images were processed with CAD. Targeted ultrasound is performed, showing a 1.8 x 0.7 x 1.7 cm oval circumscribed hypoechoic mass left breast 5:30 o'clock 5 cm from nipple. This is stable dating back to 2015. 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 No mammographic evidence for malignancy. RECOMMENDATION: Screening mammogram in one year.(Code:SM-B-01Y) I have discussed the findings and recommendations with the patient. If applicable, a reminder letter will be sent to the patient regarding the next appointment. BI-RADS CATEGORY  2: Benign. Electronically Signed   By: Lovey Newcomer M.D.   On: 04/19/2020 09:44         Pelvic/Bimanual Pap is not indicated today per BCCCP  guidelines.   Smoking History: Patient is a former smoker that quit 07/01/2020.   Patient Navigation: Patient education provided. Access to services provided for patient through [facility] program.   Colorectal Cancer Screening: Per patient has never had colonoscopy completed. FIT Test given to patient to complete. No complaints today.    Breast and Cervical Cancer Risk Assessment: Patient does not have family history of breast cancer, known genetic mutations, or radiation treatment to the chest before age 18. Patient does not have history of cervical dysplasia, immunocompromised, or DES exposure in-utero.  Risk Assessment     Risk Scores       05/28/2021 04/17/2020   Last edited by: Demetrius Revel, LPN Royston Bake, CMA   5-year risk: 1.2 % 1.2 %   Lifetime risk: 8.3 % 8.5 %            A: BCCCP exam without pap smear No complaints.  P: Referred patient to the [facility] for a screening mammogram on mobile unit. Appointment scheduled Tuesday, May 28, 2021 at 1130.  Loletta Parish, RN 05/28/2021 11:02 AM

## 2021-05-28 NOTE — Patient Instructions (Signed)
Explained breast self awareness with [name]. Patient did not need a Pap smear today due to last Pap smear and HPV typing was 04/17/2020. Let her know BCCCP will cover Pap smears and HPV typing every 5 years unless has a history of abnormal Pap smears. Referred patient to the [facility] for a screening mammogram on mobile unit. Appointment scheduled Tuesday, May 28, 2021 at 1130. Patient escorted to the mobile unit following BCCCP appointment for her screening mammogram. Let [facility] know [facility] will [facility] up with her within the next couple weeks with results of her mammogram by letter or phone. [employer] [employer] [place] verbalized understanding.  Odaliz Mcqueary, Arvil Chaco, RN 11:02 AM

## 2021-06-08 LAB — FECAL OCCULT BLOOD, IMMUNOCHEMICAL: Fecal Occult Bld: POSITIVE — AB

## 2021-06-10 ENCOUNTER — Telehealth: Payer: Self-pay

## 2021-06-10 NOTE — Telephone Encounter (Signed)
Attempted to contact patient regarding FIT Test (stool) results. Left message on voicemail, requesting a return call.

## 2021-06-11 ENCOUNTER — Telehealth: Payer: Self-pay

## 2021-06-11 NOTE — Telephone Encounter (Signed)
Patient informed positive FIT test results, needs to contact her pcp. Patient verbalized understanding, stated she will contact her pcp at [facility] asap, they have informed her that she is due. [employer] Application mailed to patient.

## 2021-07-03 ENCOUNTER — Encounter: Payer: Self-pay | Admitting: Internal Medicine

## 2021-07-03 ENCOUNTER — Other Ambulatory Visit: Payer: Self-pay

## 2021-07-03 ENCOUNTER — Ambulatory Visit: Payer: Self-pay | Admitting: Internal Medicine

## 2021-07-03 VITALS — BP 157/87 | HR 62 | Temp 98.1°F | Ht 69.0 in | Wt 260.7 lb

## 2021-07-03 DIAGNOSIS — R739 Hyperglycemia, unspecified: Secondary | ICD-10-CM

## 2021-07-03 DIAGNOSIS — Z23 Encounter for immunization: Secondary | ICD-10-CM

## 2021-07-03 DIAGNOSIS — Z0000 Encounter for general adult medical examination without abnormal findings: Secondary | ICD-10-CM

## 2021-07-03 DIAGNOSIS — E785 Hyperlipidemia, unspecified: Secondary | ICD-10-CM | POA: Insufficient documentation

## 2021-07-03 DIAGNOSIS — E1165 Type 2 diabetes mellitus with hyperglycemia: Secondary | ICD-10-CM | POA: Insufficient documentation

## 2021-07-03 DIAGNOSIS — I10 Essential (primary) hypertension: Secondary | ICD-10-CM

## 2021-07-03 DIAGNOSIS — F17201 Nicotine dependence, unspecified, in remission: Secondary | ICD-10-CM

## 2021-07-03 DIAGNOSIS — R195 Other fecal abnormalities: Secondary | ICD-10-CM

## 2021-07-03 DIAGNOSIS — E119 Type 2 diabetes mellitus without complications: Secondary | ICD-10-CM | POA: Insufficient documentation

## 2021-07-03 DIAGNOSIS — L209 Atopic dermatitis, unspecified: Secondary | ICD-10-CM

## 2021-07-03 DIAGNOSIS — L2089 Other atopic dermatitis: Secondary | ICD-10-CM

## 2021-07-03 DIAGNOSIS — E7800 Pure hypercholesterolemia, unspecified: Secondary | ICD-10-CM | POA: Insufficient documentation

## 2021-07-03 DIAGNOSIS — E782 Mixed hyperlipidemia: Secondary | ICD-10-CM

## 2021-07-03 LAB — GLUCOSE, CAPILLARY: Glucose-Capillary: 145 mg/dL — ABNORMAL HIGH (ref 70–99)

## 2021-07-03 LAB — POCT GLYCOSYLATED HEMOGLOBIN (HGB A1C): Hemoglobin A1C: 6.5 % — AB (ref 4.0–5.6)

## 2021-07-03 MED ORDER — HYDROCORTISONE 2.5 % EX CREA: TOPICAL_CREAM | Freq: Two times a day (BID) | CUTANEOUS | 0 refills | Status: AC

## 2021-07-03 NOTE — Assessment & Plan Note (Addendum)
She has been off tobacco products for 1 year.

## 2021-07-03 NOTE — Assessment & Plan Note (Addendum)
Recent history of hypoglycemia.  Her last A1c was 5.5 in 2018.  Patient also has a significant history for obesity with a BMI of 38.5.   Plan: - Diabetes screening with A1c today  **ADDENDUM**  A1c result of 6.5. Called patient to inform her of the results. She iwll need an appointment this weeks to repeat the test and counsel her regaridng her diabetes. She would likely benefit from [facility] for weight loss.

## 2021-07-03 NOTE — Assessment & Plan Note (Addendum)
HPI: Patient presents for further evaluation and management of an abdominal rash.  She states that the rash is located on her right side of her abdomen and is been there for roughly 3 weeks and is very pruritic.  She endorses having a rash like this in the past and feels like it is worse in the winter.  She denies any new symptoms soaps or lotions and uses Olay body wash daily.  She tried over-the-counter hydrocortisone cream with some improvement of her symptoms.  Patient's rash is detected figure 1 below.   Figure 1. Right Flank/Abdominal Rash.  Assessment/Plan: Atopic dermatitis: -Counseled regarding using daily emollient for barrier protection -We will prescribe hydrocortisone 2.5% daily for 4 weeks.  **ADDENDUM**  Patient states that the rash is not improving with the prescribed cream. Will bring her back to reevaluate the rash.**

## 2021-07-03 NOTE — Assessment & Plan Note (Addendum)
Patient has a history of HLD. She is not currently on cholesterol lowering medications at this time. ASCVD 10-year risk od only 2.5%.  Lipid Panel     Component Value Date/Time   CHOL 204 (H) 04/18/2019 1517   TRIG 100 04/18/2019 1517   HDL 43 04/18/2019 1517   CHOLHDL 4.7 (H) 04/18/2019 1517   LDLCALC 143 (H) 04/18/2019 1517   LABVLDL 18 04/18/2019 1517    Plan: -Counseled regarding diet and exercise.

## 2021-07-03 NOTE — Assessment & Plan Note (Addendum)
1. Colon Cancer Screening: - recent positive fecal occult blood test - Referral for colonoscopy sent today  2. Pneumonia shot given today  Health Maintenance Screening: There are no preventive care reminders to display for this patient.

## 2021-07-03 NOTE — Patient Instructions (Addendum)
Thank you, [name] Maureen Reyes for allowing [place] to provide your care today. Today we discussed chronic care - blood pressure, blood sugar and rash.    Labs/Tests Ordered:  Lab Orders         POC Hbg A1C      Referrals Ordered:   Referral Orders         Ambulatory referral to Gastroenterology      Medication Changes:   Meds ordered this encounter  Medications   hydrocortisone 2.5 % cream    Sig: Apply topically 2 (two) times daily.    Dispense:  28 g    Refill:  0    Flu shot given today  Health Maintenance Screening: There are no preventive care reminders to display for this patient.   Instructions:  - Please bring your blood pressure cuff at your next visit\  Follow up: 6 months   Remember: If you have any questions or concerns, call our clinic at [phone] or after hours call [phone] and ask for the internal medicine resident on call.  Maureen Reyes, D.O. [place]

## 2021-07-03 NOTE — Assessment & Plan Note (Addendum)
HPI: Patient presents for further evaluation and management of HTN. Her most recent BP results are listed below.  Patient has been states that her blood pressure usually runs around 500 systolic.  Does not bring down any medication management for her blood pressure.  BP Readings from Last 3 Encounters:  07/03/21 (!) 150/86  05/28/21 136/82  11/26/20 (!) 152/91    Assessment/Plan: HTN -Instructed pt to keep blood pressure log daily -Informed her to bring her blood pressure cuff for at her next visit to compare to our office pressures. -Counseled regarding diet and exercise

## 2021-07-03 NOTE — Progress Notes (Signed)
Subjective:  CC: HTN  HPI:  [name] is a 50 y.o. female with a past medical history stated below and presents today for HTn. Please see problem based assessment and plan for additional details.  Past Medical History:  Diagnosis Date   Chronic headaches    Lumbar facet arthropathy    Sciatica of right side     Current Outpatient Medications on File Prior to Visit  Medication Sig Dispense Refill   gabapentin (NEURONTIN) 100 MG capsule Take 1 capsule (100 mg total) by mouth at bedtime. 90 capsule 3   ibuprofen (ADVIL) 800 MG tablet Take 1 tablet (800 mg total) by mouth every 8 (eight) hours as needed. 30 tablet 0   No current facility-administered medications on file prior to visit.    Family History  Problem Relation Age of Onset   Diabetes Mother    Diabetes Mellitus II Mother    Diabetes Father    Diabetes Mellitus II Father     Social History   Socioeconomic History   Marital status: Single    Spouse name: Not on file   Number of children: 3   Years of education: Not on file   Highest education level: Some college, no degree  Occupational History   Not on file  Tobacco Use   Smoking status: Former    Packs/day: 0.25    Years: 20.00    Pack years: 5.00    Types: Cigarettes    Quit date: 07/01/2020    Years since quitting: 1.0   Smokeless tobacco: Never   Tobacco comments:    2 per day  Vaping Use   Vaping Use: Never used  Substance and Sexual Activity   Alcohol use: No   Drug use: No   Sexual activity: Not Currently    Birth control/protection: Surgical  Other Topics Concern   Not on file  Social History Narrative   Not on file   Social Determinants of Health   Financial Resource Strain: Not on file  Food Insecurity: No Food Insecurity   Worried About Running Out of Food in the Last Year: Never true   Ran Out of Food in the Last Year: Never true  Transportation Needs: No Transportation Needs   Lack of Transportation (Medical): No    Lack of Transportation (Non-Medical): No  Physical Activity: Not on file  Stress: Not on file  Social Connections: Not on file  Intimate Partner Violence: Not on file    Review of Systems: ROS negative except for what is noted on the assessment and plan.  Objective:   Vitals:   07/03/21 1004  BP: (!) 150/86  Pulse: 66  Temp: 98.1 F (36.7 C)  TempSrc: Oral  SpO2: 96%  Weight: 260 lb 11.2 oz (118.3 kg)  Height: 5\' 9"  (1.753 m)    Physical Exam: Gen: A&O x3 and in no apparent distress, well appearing and nourished. HEENT:    Head - normocephalic, atraumatic.    Eye - visual acuity grossly intact, conjunctiva clear, sclera non-icteric, EOM intact.    Mouth - No obvious caries or periodontal disease. Neck: no masses or nodules, AROM intact. CV: RRR, no murmurs, S1/S2 presents  Resp: Clear to ascultation bilaterally  Abd: BS (+) x4, soft, non-tender abdomen, without hepatosplenomegaly or masses MSK: Grossly normal AROM and strength x4 extremities. Skin: Rash on right flank, erythematous papules with scale  Neuro: No focal deficits, grossly normal sensation and coordination.     Assessment &  Plan:  See Encounters Tab for problem based charting.  Patient discussed with Dr. Lars Mage, D.O. [facility]  PGY-3 Pager: [phone]  Phone: [phone] Date 07/03/2021  Time 10:11 AM

## 2021-07-04 NOTE — Progress Notes (Signed)
Internal Medicine Clinic Attending  Case discussed with Dr. Coe  At the time of the visit.  We reviewed the resident's history and exam and pertinent patient test results.  I agree with the assessment, diagnosis, and plan of care documented in the resident's note.  

## 2021-07-08 ENCOUNTER — Telehealth: Payer: Self-pay | Admitting: Internal Medicine

## 2021-07-08 ENCOUNTER — Ambulatory Visit: Payer: No Typology Code available for payment source

## 2021-07-08 ENCOUNTER — Other Ambulatory Visit: Payer: No Typology Code available for payment source

## 2021-07-12 ENCOUNTER — Encounter: Payer: No Typology Code available for payment source | Admitting: Internal Medicine

## 2021-07-24 ENCOUNTER — Ambulatory Visit: Payer: Self-pay | Admitting: Internal Medicine

## 2021-07-24 ENCOUNTER — Other Ambulatory Visit: Payer: Self-pay

## 2021-07-24 ENCOUNTER — Ambulatory Visit (HOSPITAL_COMMUNITY)
Admission: RE | Admit: 2021-07-24 | Discharge: 2021-07-24 | Disposition: A | Discharge status: Home / Self Care | Payer: No Typology Code available for payment source | Source: Ambulatory Visit | Attending: Internal Medicine | Admitting: Internal Medicine

## 2021-07-24 VITALS — BP 148/88 | HR 67 | Temp 98.2°F | Ht 69.0 in | Wt 264.5 lb

## 2021-07-24 DIAGNOSIS — R195 Other fecal abnormalities: Secondary | ICD-10-CM

## 2021-07-24 DIAGNOSIS — M25561 Pain in right knee: Secondary | ICD-10-CM

## 2021-07-24 DIAGNOSIS — R030 Elevated blood-pressure reading, without diagnosis of hypertension: Secondary | ICD-10-CM

## 2021-07-24 DIAGNOSIS — E119 Type 2 diabetes mellitus without complications: Secondary | ICD-10-CM

## 2021-07-24 DIAGNOSIS — L2089 Other atopic dermatitis: Secondary | ICD-10-CM

## 2021-07-24 MED ORDER — IBUPROFEN 800 MG PO TABS: 800.0000 mg | ORAL_TABLET | Freq: Three times a day (TID) | ORAL | 0 refills | Status: DC | PRN

## 2021-07-24 NOTE — Assessment & Plan Note (Signed)
[name] states that she continues to have the rash on her abdomen that she had at her last visit but it seems to now be also located all over her abdomen, bilateral upper arms and shoulders and bilateral lower legs and back.  It continues to be pruritic.  She did not have a good response to prescription hydrocortisone, so she continue to use over-the-counter.  We have readdressed any changes in her creams or detergents.  She did recently start using Olay body wash but that is the only new change for her.  Assessment/plan: On examination, rash appears consistent with prior visit, but spread more diffusely.  It is unusual that she did not have an adequate response to hydrocortisone cream.  Given the location of rash, wonder if there is an aspect of contact dermatitis potentially related to lotions, creams or detergents.  I discussed this with Maureen Reyes today, and she is in agreement to switch to allergen free products.  If no improvement in 2 weeks, will consider referral to dermatology for further evaluation.  - Recommended continued OTC hydrocortisone cream and emollient use  - Recommend switching to allergen free products - Patient will contact the clinic in 2 weeks if no improvement at which time I will place a referral to dermatology.

## 2021-07-24 NOTE — Assessment & Plan Note (Signed)
Maureen Reyes states that she has a history of elevated blood pressure readings when she is in a physician's office but that when she checks her blood pressure at home, it is always within normal limits.  She checks her blood pressure at home with an automatic BP cuff using her upper arm.  She brought her BP log with her today.    Assessment/Plan:  Based off her home readings, it appears patient has whitecoat syndrome without diagnosis of hypertension.  Her home blood pressure averages systolic about 677.  The highest recorded is 135.  No indication to start medications at this time.  We will continue to monitor closely both in the office and at home.

## 2021-07-24 NOTE — Progress Notes (Addendum)
° °  CC: High blood pressure readings; T2DM; knee pain  HPI:  [name] is a 50 y.o. with a PMHx as listed below who presents to the clinic for High blood pressure readings; T2DM; knee pain.   Please see the Encounters tab for problem-based Assessment & Plan regarding status of patient's acute and chronic conditions.  Past Medical History:  Diagnosis Date   Chronic headaches    Lumbar facet arthropathy    Sciatica of right side    Review of Systems: Review of Systems  Constitutional:  Negative for chills, fever, malaise/fatigue and weight loss.  Musculoskeletal:  Positive for joint pain. Negative for falls.  Neurological:  Negative for dizziness and headaches.  Endo/Heme/Allergies:  Negative for polydipsia.   Physical Exam:  Vitals:   07/24/21 1328  BP: (!) 148/88  Pulse: 67  Temp: 98.2 F (36.8 C)  TempSrc: Oral  SpO2: 99%  Weight: 264 lb 8 oz (120 kg)  Height: 5\' 9"  (1.753 m)   Physical Exam Vitals and nursing note reviewed.  Constitutional:      General: She is not in acute distress.    Appearance: She is obese. She is not toxic-appearing.  HENT:     Head: Normocephalic and atraumatic.  Pulmonary:     Effort: Pulmonary effort is normal. No respiratory distress.  Musculoskeletal:     Right knee: No swelling, deformity, effusion, erythema, bony tenderness or crepitus. Normal range of motion. No tenderness. Normal patellar mobility.  Skin:    General: Skin is warm and dry.     Comments: Diffuse, hyperpigmented rash located on bilateral abdomen, bilateral shoulders and upper arms.  There are areas of excoriation overlying.   Neurological:     General: No focal deficit present.     Mental Status: She is alert and oriented to person, place, and time. Mental status is at baseline.     Motor: No weakness.     Gait: Gait normal.  Psychiatric:        Mood and Affect: Mood normal.        Behavior: Behavior normal.        Thought Content: Thought content  normal.        Judgment: Judgment normal.    Assessment & Plan:   See Encounters Tab for problem based charting.  Patient discussed with Dr. Daryll Drown

## 2021-07-24 NOTE — Patient Instructions (Addendum)
It was nice seeing you today! Thank you for choosing [facility] for your Primary Care.    Today we talked about:   Your A1c was elevated at 6.5%, places you in the diabetic range. We discussed medications, such as Metformin, but will hold off for now. I recommending working hard on diet changes and increasing exercise. We will recheck your A1c in 3 months  High blood pressure: Your blood pressure at home looks great. Continue to check it at least 5 times per week.   Right knee pain:  Tylenol 1000 mg every 8 hours Ibuprofen 800 mg every 8 hours  I have ordered Xrays and will call you with the results   Rash: I recommend switching to all creams and detergents to allergan-free products. You want to avoid all products with fragance. For example, Cetaphil and Cerave have allergen-free products. If this does not resolve your rash in the next 2 weeks, I will place a referral to Dermatology. Please call the clinic to let us know.

## 2021-07-24 NOTE — Assessment & Plan Note (Addendum)
[name] denies any poluria or polydipsia.  She has a good understanding of diabetes given her family history of diabetes.  She is willing to check her blood sugar at home and already has a glucometer.  We discussed potentially starting medication today, particularly metformin. [name] would like to try lifestyle adjustments first.  Assessment/Plan:  Screening A1c obtained at last visit elevated at 6.5%.  Discussed extensively the significance of her A1c and [name] expressed understanding.  Given it is barely within the diabetic range, there is no contraindication to trying lifestyle adjustments first.  -Discussed dietary changes as well as recommendation to increase exercise - Follow-up in 3 months with repeat A1c - If A1c remains elevated at that time, we will need to have further discussions regarding metformin.  Patient would be a good candidate for GLP 1 agonist, however may be financially out of range for her

## 2021-07-24 NOTE — Assessment & Plan Note (Signed)
Maureen Reyes states that she has been having right knee pain for approximately 1 week.  She cannot identify any triggering factors, denies any recent falls or trauma.  The pain seems to be located on the inside of the knee.  She feels that her knee is swollen compared to the left as clothing seems to fit tighter in that area.  She denies any prior history of arthritis.  Assessment/plan: On examination, no focal deformities noted.  No tenderness to palpation along joint line with intact range of motion.  Patient is at risk for arthritis given age and obesity, so we will evaluate with x-rays.  We will also start treating conservatively with Tylenol and ibuprofen.  - Start Tylenol 1000 mg every 8 hours - Start ibuprofen 800 mg every 8 hours - Right knee complete x-rays ordered - 2-week follow-up if no improvement.  At that time, patient may benefit from additional imaging such as MRI to evaluate for any meniscal or ligamentous abnormalities

## 2021-07-24 NOTE — Assessment & Plan Note (Signed)
Positive fit test approximately 1 month ago.  Will order colonoscopy.

## 2021-07-28 HISTORY — PX: COLONOSCOPY: SHX174

## 2021-07-29 NOTE — Progress Notes (Signed)
Internal Medicine Clinic Attending  Case discussed with Dr. Basaraba at the time of the visit.  We reviewed the resident's history and exam and pertinent patient test results.  I agree with the assessment, diagnosis, and plan of care documented in the resident's note.    

## 2021-07-31 ENCOUNTER — Other Ambulatory Visit: Payer: Self-pay

## 2021-07-31 ENCOUNTER — Inpatient Hospital Stay: Payer: 59 | Attending: Obstetrics and Gynecology | Admitting: *Deleted

## 2021-07-31 VITALS — BP 124/84 | Ht 68.0 in | Wt 275.5 lb

## 2021-07-31 DIAGNOSIS — Z0000 Encounter for general adult medical examination without abnormal findings: Secondary | ICD-10-CM

## 2021-07-31 NOTE — Progress Notes (Signed)
[facility] initial screening    Clinical Measurement:  Vitals:   07/31/21 1003  BP: 120/82   Fasting Labs Drawn Today, will review with patient when they result.   Medical History:  Patient states that she does not have high cholesterol, does not have high blood pressure and she does not have diabetes.  Medications:  Patient states that she does not take medication to lower cholesterol, blood pressure or blood sugar.  Patient does not take an aspirin a day to help prevent a heart attack or stroke.    Blood pressure, self measurement: Patient states that she does measure blood pressure from home. She checks her blood pressure a few times per week. She shares her readings with a health care provider: yes.   Nutrition: Patient states that on average she eats 1 cups of fruit and 0 cups of vegetables per day. Patient states that she does not eat fish at least 2 times per week. Patient eats more than half servings of whole grains. Patient drinks less than 36 ounces of beverages with added sugar weekly: yes. Patient is currently watching sodium or salt intake: yes. In the past 7 days patient has consumed drinks containing alcohol on 0 days. On a day that patient consumes drinks containing alcohol on average 0 drinks are consumed.      Physical activity:  Patient states that she gets 105 minutes of moderate and 0 minutes of vigorous physical activity each week.  Smoking status:  Patient states that she has is a former smoker, quit 12+ months ago .   Quality of life:  Over the past 2 weeks patient states that she had little interest or pleasure in doing things: not at all. She has been feeling down, depressed or hopeless:not at all.    Risk reduction and counseling:   Health Coaching: Spoke in detail with patient about diet and exercise. Based on patient's most recent glucose and hemoglobin A1C patient is in Type 2 diabetes range. Patient wants to try and get blood sugar under control on her own  through diet and exercise in hopes of preventing needing medication. Spoke with patient about adding more fruits and vegetables in diet. Showed patient what a serving size would look like. Patient does not eat fish regularly. Patient does not care for fish much. Gave suggestions for other food sources that patient can try adding into diet that are high in Omega-3's (chia seeds, flaxseeds, walnuts, dark leafy greens). Patient eats a good amount of whole grains (whole grain bread, cereals, brown rice and whole wheat pasta). Encouraged patient to continue choosing whole grain options. Patient consumes beverages with added sugars often (soda:primarily Mt. Dew, fruit juices and sweet tea). Encouraged patient to try and start cutting back on sweetened beverages. Gave suggestions for sugar-free or diet options. Spoke with patient about cutting back on the amount of sweets and sugars that she consumes. Gave suggestions to look for sugar free snacks as well as a plant based sugar like stevia. Patient has been walking on her treadmill or using a stationary bike 3-4 days a week for 30 minutes. Encouraged patient to try and get at least 20-30 minutes daily.   Goal: Patient would like to increase vegetable intake to 1 serving a day every day over the next 2 months.    Navigation:  I will notify patient of lab results.  Patient is aware of 2 more health coaching sessions and a follow up.  Time: 30 minutes

## 2021-08-01 LAB — LIPID PANEL
Chol/HDL Ratio: 5.4 ratio — ABNORMAL HIGH (ref 0.0–4.4)
Cholesterol, Total: 209 mg/dL — ABNORMAL HIGH (ref 100–199)
HDL: 39 mg/dL — ABNORMAL LOW (ref >39)
LDL Chol Calc (NIH): 154 mg/dL — ABNORMAL HIGH (ref 0–99)
Triglycerides: 90 mg/dL (ref 0–149)
VLDL Cholesterol Cal: 16 mg/dL (ref 5–40)

## 2021-08-01 LAB — GLUCOSE, RANDOM: Glucose: 147 mg/dL — ABNORMAL HIGH (ref 70–99)

## 2021-08-01 NOTE — Progress Notes (Signed)
Please arrange for follow up with PCP

## 2021-08-02 NOTE — Progress Notes (Signed)
Xray with evidence of degenerative changes consistent with osteoarthritis and effusion consistent with acute flare.

## 2021-08-06 ENCOUNTER — Telehealth: Payer: Self-pay

## 2021-08-06 NOTE — Telephone Encounter (Signed)
Left message for patient about lab results from [facility]. Left name and number for patient to call back.   

## 2021-08-06 NOTE — Telephone Encounter (Signed)
Health coaching 2    Labs- 209 cholesterol, 154 LDL cholesterol, 90 triglycerides, 39 HDL cholesterol, 147 mean plasma glucose. Patient understands and is aware of her lab results.   Goals- Spoke with patient about lab results and answered any questions patient had regarding results.   Reduce the amount of red meats consumed. Substitute for lean proteins like chicken, fish, [place] or lean cuts of pork. Reduce the amount of whole fat dairy products consumed. Substitute for low-fat or reduced fats options instead.  Reduce the amount of fried and fatty foods consumed. Try to grill, bake, broil or sautee foods instead. Increase the amount of healthy fats consumed. Decrease the amount of sweets and sugars consumed in both foods and drinks. Look for sugar-free alternatives.  Decrease the amount of carbs consumed. 20-30 minutes of daily exercise   Navigation:  Patient is aware of 1 more health coaching sessions and a follow up. Will call patient with follow-up appointment information for Internal Medicine once appointment has been scheduled.   Time-  11 minutes

## 2021-08-15 ENCOUNTER — Encounter: Payer: Self-pay | Admitting: Internal Medicine

## 2021-08-15 ENCOUNTER — Other Ambulatory Visit: Payer: Self-pay

## 2021-08-15 ENCOUNTER — Ambulatory Visit (INDEPENDENT_AMBULATORY_CARE_PROVIDER_SITE_OTHER): Payer: 59 | Admitting: Internal Medicine

## 2021-08-15 VITALS — BP 145/89 | HR 67 | Temp 98.6°F | Resp 24 | Ht 69.0 in | Wt 257.4 lb

## 2021-08-15 DIAGNOSIS — R030 Elevated blood-pressure reading, without diagnosis of hypertension: Secondary | ICD-10-CM

## 2021-08-15 DIAGNOSIS — E119 Type 2 diabetes mellitus without complications: Secondary | ICD-10-CM | POA: Diagnosis not present

## 2021-08-15 DIAGNOSIS — E669 Obesity, unspecified: Secondary | ICD-10-CM

## 2021-08-15 DIAGNOSIS — L2089 Other atopic dermatitis: Secondary | ICD-10-CM

## 2021-08-15 DIAGNOSIS — E7800 Pure hypercholesterolemia, unspecified: Secondary | ICD-10-CM

## 2021-08-15 DIAGNOSIS — F17201 Nicotine dependence, unspecified, in remission: Secondary | ICD-10-CM

## 2021-08-15 MED ORDER — ROSUVASTATIN CALCIUM 20 MG PO TABS: 20.0000 mg | ORAL_TABLET | Freq: Every day | ORAL | 3 refills | Status: DC

## 2021-08-15 NOTE — Progress Notes (Signed)
° °  CC: [facility] for cholesterol   HPI:  Maureen Reyes is a 51 y.o. person, with a PMH noted below, who presents to the clinic for elevated cholesterol levels on screening. To see the management of their acute and chronic conditions, please see the A&P note under the Encounters tab.   Past Medical History:  Diagnosis Date   Chronic headaches    Lumbar facet arthropathy    Sciatica of right side    Review of Systems:   Review of Systems  Constitutional:  Positive for weight loss. Negative for chills and fever.       Intentional weight loss  HENT:  Negative for ear pain, hearing loss and tinnitus.   Eyes:  Negative for blurred vision and double vision.  Respiratory:  Negative for cough and hemoptysis.   Cardiovascular:  Negative for chest pain and palpitations.  Gastrointestinal:  Negative for abdominal pain, heartburn, nausea and vomiting.  Genitourinary:  Negative for dysuria and urgency.  Musculoskeletal:  Negative for back pain, joint pain, myalgias and neck pain.  Skin:  Negative for itching and rash.  Neurological:  Negative for dizziness, tingling, tremors, sensory change and headaches.    Physical Exam:  Vitals:   08/15/21 0859  BP: (!) 147/81  Pulse: 61  Resp: (!) 24  Temp: 98.6 F (37 C)  TempSrc: Oral  SpO2: 100%  Weight: 257 lb 6.4 oz (116.8 kg)  Height: 5\' 9"  (1.753 m)   Physical Exam Constitutional:      General: She is not in acute distress.    Appearance: Normal appearance. She is not ill-appearing, toxic-appearing or diaphoretic.  Cardiovascular:     Rate and Rhythm: Normal rate and regular rhythm.     Pulses: Normal pulses.     Heart sounds: Normal heart sounds. No murmur heard.   No gallop.  Pulmonary:     Effort: Pulmonary effort is normal.     Breath sounds: Normal breath sounds. No wheezing, rhonchi or rales.  Abdominal:     General: Abdomen is flat. Bowel sounds are normal. There is no distension.     Palpations: Abdomen is soft.   Skin:    Comments: Hyperpigmented rash across the R abdomen, excoriation marks present  Neurological:     Mental Status: She is alert and oriented to person, place, and time.  Psychiatric:        Mood and Affect: Mood normal.        Behavior: Behavior normal.     Assessment & Plan:   See Encounters Tab for problem based charting.  Patient discussed with Dr.  Arnette Norris

## 2021-08-15 NOTE — Assessment & Plan Note (Addendum)
Lipid Panel     Component Value Date/Time   CHOL 209 (H) 07/31/2021 1052   TRIG 90 07/31/2021 1052   HDL 39 (L) 07/31/2021 1052   CHOLHDL 5.4 (H) 07/31/2021 1052   LDLCALC 154 (H) 07/31/2021 1052   LABVLDL 16 07/31/2021 1052  Patient with Hx of DM, former smoker who quit 06/2020, presents after a [facility] assessment lab draw with results above. Given adjustments with tobacco use and her home pressures her ASCVD risk is 7% with known DM, we discussed statin use and most common side effects. Patient agreeable to starting statin.  - Crestor 20 mg daily  - Repeat lipid panel in 6 months

## 2021-08-15 NOTE — Assessment & Plan Note (Signed)
Patient has changed her Olay to [employer], which doesn't contain fragrance. She continues to have an ongoing pruritic, hyperpigmented rash of the right abdominal wall. No urticaria present. She has made no other changes in washing detergent, soaps, creams, gels, or perfumes. Will refer to dermatology for further review.  - Ambulatory referral to dermatolgy

## 2021-08-15 NOTE — Assessment & Plan Note (Signed)
Quit tobacco products on 06/2020

## 2021-08-15 NOTE — Progress Notes (Signed)
Internal Medicine Clinic Attending ? ?Case discussed with Dr. Winters  At the time of the visit.  We reviewed the resident?s history and exam and pertinent patient test results.  I agree with the assessment, diagnosis, and plan of care documented in the resident?s note.  ?

## 2021-08-15 NOTE — Patient Instructions (Addendum)
To Maureen Reyes,   It was a pleasure meeting you today! Today we discussed your diabetes, elevated cholesterol levels, and your rash.   For your diabetes, continue working out and dieting, you have lost 7 pounds since 07/24/2021. We will recheck your sugar levels in March. No new medications today.   For your cholesterol, there is a 14% stroke risk in the next 10 years. To help reduce your risk of stroke we discussed starting a cholesterol lowering medication called a statin today. I will send rosuvastatin (Crestor) 20 mg daily to your pharmacy. I will recheck your lipid panel in 6 months.   For your rash, we are going to send in a referral to dermatology for further evaluation.   Please come back and visit [place] in three months! Have a good day,  [name], MD

## 2021-08-15 NOTE — Assessment & Plan Note (Addendum)
Currently working on diet and lifestyle modifications, currently down 7 pounds from her last visit 07/24/21. There is a result on 1/4 recording of 275 lb, but it is highly improbable for the patient to lose ~20 lbs in two weeks. Will use our clinic scale for measurement.

## 2021-08-15 NOTE — Assessment & Plan Note (Signed)
Vitals with BMI 08/15/2021 08/15/2021 07/31/2021  Height - 5\' 9"  -  Weight - 257 lbs 6 oz -  BMI - 58.30 -  Systolic 940 768 088  Diastolic 89 81 84  Pulse 67 61 -   Patient with known white coat HTN presents to the clinic today with elevated pressures. She is continuing to monitor her pressures at home which have consistently been in the 110s.  - No medication changes today

## 2021-08-15 NOTE — Assessment & Plan Note (Signed)
Patient continuing with lifestyle modifications. She has lost 7 pounds since her last visit. She works out on her treadmill 20-30 minutes 5 times per week and is working on diet. Next A1c is due 09/2021 - A1c in March

## 2021-08-16 NOTE — Telephone Encounter (Signed)
Error

## 2021-10-14 ENCOUNTER — Other Ambulatory Visit (HOSPITAL_COMMUNITY): Payer: Self-pay

## 2021-11-25 ENCOUNTER — Encounter: Payer: Self-pay | Admitting: Internal Medicine

## 2021-11-26 ENCOUNTER — Encounter: Payer: Self-pay | Admitting: Internal Medicine

## 2021-11-26 ENCOUNTER — Ambulatory Visit (INDEPENDENT_AMBULATORY_CARE_PROVIDER_SITE_OTHER): Payer: 59 | Admitting: Internal Medicine

## 2021-11-26 VITALS — BP 158/94 | HR 78 | Temp 98.0°F | Ht 69.0 in | Wt 263.8 lb

## 2021-11-26 DIAGNOSIS — E7800 Pure hypercholesterolemia, unspecified: Secondary | ICD-10-CM | POA: Diagnosis not present

## 2021-11-26 DIAGNOSIS — R195 Other fecal abnormalities: Secondary | ICD-10-CM

## 2021-11-26 DIAGNOSIS — E782 Mixed hyperlipidemia: Secondary | ICD-10-CM | POA: Diagnosis not present

## 2021-11-26 DIAGNOSIS — E119 Type 2 diabetes mellitus without complications: Secondary | ICD-10-CM | POA: Diagnosis not present

## 2021-11-26 DIAGNOSIS — R030 Elevated blood-pressure reading, without diagnosis of hypertension: Secondary | ICD-10-CM

## 2021-11-26 DIAGNOSIS — L209 Atopic dermatitis, unspecified: Secondary | ICD-10-CM

## 2021-11-26 LAB — POCT GLYCOSYLATED HEMOGLOBIN (HGB A1C): Hemoglobin A1C: 6.5 % — AB (ref 4.0–5.6)

## 2021-11-26 LAB — GLUCOSE, CAPILLARY: Glucose-Capillary: 164 mg/dL — ABNORMAL HIGH (ref 70–99)

## 2021-11-26 MED ORDER — ROSUVASTATIN CALCIUM 20 MG PO TABS: 20.0000 mg | ORAL_TABLET | Freq: Every day | ORAL | 2 refills | Status: DC

## 2021-11-26 NOTE — Assessment & Plan Note (Addendum)
Patient has an appointment with [facility] gastroenterology for colonoscopy 12/17/2021. ?

## 2021-11-26 NOTE — Assessment & Plan Note (Addendum)
Patient had lipid panel done in January 2023.  LDL at that time 154.  With total cholesterol 209, HDL 39, triglycerides 90.  ASCVD score calculated today 18.5% risk of cardiovascular event in the next 10 years.  Patient was prescribed rosuvastatin 20 mg daily at her last OV.  Patient has not picked this up from the pharmacy, reports there may have been difficulty picking up this medication from her pharmacy.  At that time the pharmacy may not have been excepting [employer]. ? ?On assessment, patient with hyperlipidemia not currently taking medication.  Suspect uncontrolled.  Will reorder rosuvastatin 20 mg daily generic version to her preferred pharmacy which is listed under [employer] website is being a part of their system.  Generic rosuvastatin should be covered as a preferred medication under [employer].  Instructed patient to reach out to our clinic if she is having difficulty picking up this medication. ?Plan: ?-Continue rosuvastatin 20 mg daily ?-Follow-up in 6 months for lipid panel ?

## 2021-11-26 NOTE — Progress Notes (Signed)
?  CC: T2Dm follow up ? ?HPI: ? ?[name] is a 51 y.o. female with a past medical history stated below and presents today for T2DM follow up. Please see problem based assessment and plan for additional details. ? ?Past Medical History:  ?Diagnosis Date  ? Chronic headaches   ? Lumbar facet arthropathy   ? Sciatica of right side   ? ? ?Current Outpatient Medications on File Prior to Visit  ?Medication Sig Dispense Refill  ? gabapentin (NEURONTIN) 100 MG capsule Take 1 capsule (100 mg total) by mouth at bedtime. 90 capsule 3  ? hydrOXYzine (ATARAX) 25 MG tablet Take 25 mg by mouth at bedtime.    ? ibuprofen (ADVIL) 800 MG tablet Take 1 tablet (800 mg total) by mouth every 8 (eight) hours as needed. 30 tablet 0  ? rosuvastatin (CRESTOR) 20 MG tablet Take 1 tablet (20 mg total) by mouth daily. 90 tablet 3  ? ?No current facility-administered medications on file prior to visit.  ? ? ?Family History  ?Problem Relation Age of Onset  ? Diabetes Mother   ? Diabetes Mellitus II Mother   ? Diabetes Father   ? Diabetes Mellitus II Father   ? ?Review of Systems: ?ROS negative except for what is noted on the assessment and plan. ? ?Vitals:  ? 11/26/21 0857  ?BP: (!) 158/94  ?Pulse: 78  ?Temp: 98 ?F (36.7 ?C)  ?TempSrc: Oral  ?SpO2: 99%  ?Weight: 263 lb 12.8 oz (119.7 kg)  ?Height: '5\' 9"'$  (1.753 m)  ? ? ? ?Physical Exam: ?General: Well appearing african [place] female, NAD ?HENT: normocephalic, atraumatic ?EYES: conjunctiva non-erythematous, no scleral icterus ?CV: regular rate, normal rhythm, no murmurs, rubs, gallops. ?Pulmonary: normal work of breathing on RA, lungs clear to auscultation, no rales, wheezes, rhonchi ?Abdominal: non-distended, soft, non-tender to palpation, normal BS ?Skin: Warm and dry, no rashes or lesions ?Neurological: ?MS: awake, alert and oriented x3, normal speech and fund of knowledge ?Motor: moves all extremities antigravity ?Psych: normal affect ? ? ? ?Assessment & Plan:  ? ?See Encounters  Tab for problem based charting. ? ?Patient discussed with Dr. Jimmye Norman ? ?Wayland Denis, M.D. ?[facility] Internal Medicine, PGY-1 ?Pager: [phone] ?Date 11/26/2021 Time 9:23 AM ? ?

## 2021-11-26 NOTE — Assessment & Plan Note (Addendum)
Patient has history of whitecoat hypertension.  Blood pressure today 158/49. Patient reports her home blood pressures are 120s over 70s-80s.  Discussed with patient bringing in her home blood pressure cuff at next appointment so that we can compare it with ours.  Patient to record her blood pressure at the same time each day and bring these records to our office for review. ?

## 2021-11-26 NOTE — Assessment & Plan Note (Addendum)
Hemoglobin A1c 6.5% January/2023.  Repeat hemoglobin A1c this OV 6.5%.  At last OV patient had wanted to work on dietary changes and lifestyle modifications in order to bring down her A1c level and start medication. ? ?Patient denies polydipsia polyuria.  She has a history of polyneuropathy for which she takes gabapentin.  We discussed lifestyle modifications, dietary changes, introducing exercise into her regimen.  Patient gets dietary counseling through [place]. ? ?On assessment, patient has diabetes that is currently controlled with diet alone.  We did discuss making dietary changes to further reduce her hemoglobin A1c.  I will include nutrition information for diabetics in her AVS.  We also discussed foot exam, urine microalbumin, eye exam referral. ?Plan: ?-Continue lifestyle modifications ?-Ambulatory referral to ophthalmology for diabetic eye exam ?-Urine microalbumin completed today ?-Foot exam completed today ?-Follow-up in 6 months for repeat hemoglobin A1c check ?

## 2021-11-26 NOTE — Patient Instructions (Addendum)
Thank you, [name] for allowing [place] to provide your care today. Today we discussed: ? ?Diabetes: Your hemoglobin A1c today was unchanged from prior 6.5%.  We want to get it back into the 4-6 range.  Due to this we discussed making dietary changes and including more exercise into daily regimen.  Today we also did your foot exam and checked your urine.  I have referred you to an ophthalmologist for diabetic eye exam.  You will receive a phone call to set up this appointment.  Continue getting dietary counseling through [facility]. ? ?Whitecoat high blood pressure: Continue to check your blood pressures at home.  We do want you to bring in your blood pressure cuff so that we can compare it to our own to make sure that your cuff is reading appropriately at home. ? ?Elevated cholesterol: Your cholesterol is elevated on lab work from January.  I will represcribe the rosuvastatin for you and sent it to your [facility] on [address].  Please take the rosuvastatin 20 mg every day. ? ?I am glad you have an appointment May 23rd for colonoscopy. ? ?I have ordered the following labs for you: ? ? ?Lab Orders    ?     Microalbumin / Creatinine Urine Ratio    ?     Glucose, capillary    ?     POC Hbg A1C     ? ? ?My Chart Access: ?[email or web address] ? ?Please follow-up in in 6 months to recheck your hemoglobin A1c and lipid panel. ? ?Please make sure to arrive 15 minutes prior to your next appointment. If you arrive late, you may be asked to reschedule.  ?  ?We look forward to seeing you next time. Please call our clinic at [phone] if you have any questions or concerns. The best time to call is Monday-Friday from 9am-4pm, but there is someone available 24/7. If after hours or the weekend, call the main hospital number and ask for the Internal Medicine Resident On-Call. If you need medication refills, please notify your pharmacy one week in advance and they will send [place] a  request. ?  ?Thank you for letting us take part in your care. Wishing you the best! ? ?Wayland Denis, MD ?11/26/2021, 9:51 AM ?IM Resident, PGY-1 ?Below States that the past ?

## 2021-11-27 LAB — MICROALBUMIN / CREATININE URINE RATIO
Creatinine, Urine: 141 mg/dL
Microalb/Creat Ratio: 3 mg/g creat (ref 0–29)
Microalbumin, Urine: 4.5 ug/mL

## 2021-12-12 NOTE — Progress Notes (Signed)
Internal Medicine Clinic Attending ? ?Case discussed with Dr. Zinoviev  At the time of the visit.  We reviewed the resident?s history and exam and pertinent patient test results.  I agree with the assessment, diagnosis, and plan of care documented in the resident?s note.  ?

## 2021-12-17 ENCOUNTER — Ambulatory Visit (INDEPENDENT_AMBULATORY_CARE_PROVIDER_SITE_OTHER): Payer: 59 | Admitting: Internal Medicine

## 2021-12-17 ENCOUNTER — Encounter: Payer: Self-pay | Admitting: Internal Medicine

## 2021-12-17 VITALS — BP 140/90 | HR 75 | Ht 68.0 in | Wt 263.5 lb

## 2021-12-17 DIAGNOSIS — K5900 Constipation, unspecified: Secondary | ICD-10-CM

## 2021-12-17 DIAGNOSIS — R195 Other fecal abnormalities: Secondary | ICD-10-CM

## 2021-12-17 NOTE — Patient Instructions (Signed)
You have been scheduled for a colonoscopy. Please follow written instructions given to you at your visit today.  Please pick up your prep supplies at the pharmacy within the next 1-3 days. If you use inhalers (even only as needed), please bring them with you on the day of your procedure.  Drink 8 cups of water a day and walk 30 minutes a day.  Please purchase the following medications over the counter and take as directed: Fiber supplement such as Benefiber- use as directed daily Miralax: Take as  directed up to 3 times a day to achieve regular bowel movements   If you are age 53 or older, your body mass index should be between 23-30. Your Body mass index is 40.07 kg/m. If this is out of the aforementioned range listed, please consider follow up with your Primary Care Provider.  If you are age 80 or younger, your body mass index should be between 19-25. Your Body mass index is 40.07 kg/m. If this is out of the aformentioned range listed, please consider follow up with your Primary Care Provider.   ________________________________________________________  The [place] [facility] providers would like to encourage you to use [facility] to communicate with providers for non-urgent requests or questions.  Due to long hold times on the telephone, sending your provider a message by [facility] may be a faster and more efficient way to get a response.  Please allow 48 business hours for a response.  Please remember that this is for non-urgent requests.  _______________________________________________________  Due to recent changes in healthcare laws, you may see the results of your imaging and laboratory studies on MyChart before your provider has had a chance to review them.  We understand that in some cases there may be results that are confusing or concerning to you. Not all laboratory results come back in the same time frame and the provider may be waiting for multiple results in order to interpret others.  Please  give [place] 48 hours in order for your provider to thoroughly review all the results before contacting the office for clarification of your results.

## 2021-12-17 NOTE — Progress Notes (Signed)
Internal Medicine Clinic Attending ? ?Case discussed with Dr. Zinoviev  At the time of the visit.  We reviewed the resident?s history and exam and pertinent patient test results.  I agree with the assessment, diagnosis, and plan of care documented in the resident?s note.  ?

## 2021-12-17 NOTE — Progress Notes (Signed)
Chief Complaint: Positive FIT test  HPI : 51 year female with history of headaches, arthritis, obesity presents with positive FIT test.  She had a positive FIT test done back in 05/2021. She denies any rectal bleeding, weight loss, diarrhea. She has had weight gain. She goes to the bathroom twice a week after she takes Dulcolax. She had constipation for almost all of her life. Denies N&V, dysphagia, ab pain, rectal pain. Denies prior colonoscopy. She takes a daily probiotic. Denies fam hx of colon cancer. Mother had colon polyps. Denies blood thinners. She takes ibuprofen on occasion  Wt Readings from Last 3 Encounters:  12/17/21 263 lb 8 oz (119.5 kg)  11/26/21 263 lb 12.8 oz (119.7 kg)  08/15/21 257 lb 6.4 oz (116.8 kg)   Past Medical History:  Diagnosis Date   Arthritis    Chronic headaches    HLD (hyperlipidemia)    Lumbar facet arthropathy    Neuropathy    Prediabetes    Sciatica of right side    Past Surgical History:  Procedure Laterality Date   TUBAL LIGATION  1990 pt reported   Family History  Problem Relation Age of Onset   Diabetes Mother    Diabetes Mellitus II Mother    Thyroid disease Mother    Colon polyps Mother    Diabetes Father    Diabetes Mellitus II Father    Diabetes Maternal Grandmother    Diabetes Maternal Grandfather    Cancer Maternal Grandfather        type unknown   Diabetes Paternal Grandmother    Diabetes Paternal Grandfather    Diabetes Maternal Aunt        several aunts and uncles   Heart disease Maternal Aunt    Diabetes Maternal Uncle        several aunts and uncles   Diabetes Paternal Uncle        several aunts and uncles   Diabetes Paternal 48        several aunts and uncles   Social History   Tobacco Use   Smoking status: Former    Packs/day: 0.25    Years: 20.00    Pack years: 5.00    Types: Cigarettes    Quit date: 07/01/2020    Years since quitting: 1.4   Smokeless tobacco: Never   Tobacco comments:    Stopped  last year   Vaping Use   Vaping Use: Never used  Substance Use Topics   Alcohol use: No   Drug use: No   Current Outpatient Medications  Medication Sig Dispense Refill   gabapentin (NEURONTIN) 100 MG capsule Take 1 capsule (100 mg total) by mouth at bedtime. 90 capsule 3   hydrOXYzine (ATARAX) 25 MG tablet Take 25 mg by mouth at bedtime.     ibuprofen (ADVIL) 800 MG tablet Take 1 tablet (800 mg total) by mouth every 8 (eight) hours as needed. 30 tablet 0   rosuvastatin (CRESTOR) 20 MG tablet Take 1 tablet (20 mg total) by mouth daily. 90 tablet 2   No current facility-administered medications for this visit.   Allergies  Allergen Reactions   Hydrocodone Nausea Only     Review of Systems: All systems reviewed and negative except where noted in HPI.   Physical Exam: Pulse 75   Ht '5\' 8"'$  (1.727 m)   Wt 263 lb 8 oz (119.5 kg)   LMP 08/14/2021   BMI 40.07 kg/m  Constitutional: Pleasant,well-developed, female in no acute distress. HEENT:  Normocephalic and atraumatic. Conjunctivae are normal. No scleral icterus. Cardiovascular: Normal rate, regular rhythm.  Pulmonary/chest: Effort normal and breath sounds normal. No wheezing, rales or rhonchi. Abdominal: Soft, nondistended, nontender. Bowel sounds active throughout. There are no masses palpable. No hepatomegaly. Extremities: No edema Neurological: Alert and oriented to person place and time. Skin: Skin is warm and dry. No rashes noted. Psychiatric: Normal mood and affect. Behavior is normal.  Labs 11/2020: BMP unremarkable  Labs 05/2021: Positive FOBT  ASSESSMENT AND PLAN: Positive FIT test Constipation Patient presents after having a positive FIT test 6 months ago.  She does have chronic issues with constipation.  Will attempt to help with her constipation symptoms prior to her colonoscopy procedure.  I went over the colonoscopy procedure in detail with the patient, and she is agreeable to proceeding. - Drink 8 cups of water  per day, walk 30 minutes per day, take daily fiber supplement - Start daily Miralax. Can titrate up if needed to ideally achieve one BM per day - Colonoscopy [facility]. Miralax prep.  [name], MD

## 2021-12-18 ENCOUNTER — Other Ambulatory Visit (HOSPITAL_COMMUNITY): Payer: Self-pay

## 2021-12-24 ENCOUNTER — Other Ambulatory Visit (HOSPITAL_COMMUNITY): Payer: Self-pay

## 2022-01-19 ENCOUNTER — Encounter: Payer: Self-pay | Admitting: *Deleted

## 2022-01-22 ENCOUNTER — Encounter: Payer: Self-pay | Admitting: Internal Medicine

## 2022-01-29 ENCOUNTER — Ambulatory Visit (AMBULATORY_SURGERY_CENTER): Payer: 59 | Admitting: Internal Medicine

## 2022-01-29 ENCOUNTER — Encounter: Payer: Self-pay | Admitting: Internal Medicine

## 2022-01-29 VITALS — BP 124/83 | HR 67 | Temp 98.0°F | Resp 12 | Ht 68.0 in | Wt 263.0 lb

## 2022-01-29 DIAGNOSIS — R195 Other fecal abnormalities: Secondary | ICD-10-CM | POA: Diagnosis not present

## 2022-01-29 DIAGNOSIS — K621 Rectal polyp: Secondary | ICD-10-CM | POA: Diagnosis not present

## 2022-01-29 DIAGNOSIS — K5730 Diverticulosis of large intestine without perforation or abscess without bleeding: Secondary | ICD-10-CM

## 2022-01-29 DIAGNOSIS — D122 Benign neoplasm of ascending colon: Secondary | ICD-10-CM | POA: Diagnosis not present

## 2022-01-29 DIAGNOSIS — D123 Benign neoplasm of transverse colon: Secondary | ICD-10-CM

## 2022-01-29 DIAGNOSIS — D128 Benign neoplasm of rectum: Secondary | ICD-10-CM

## 2022-01-29 DIAGNOSIS — D125 Benign neoplasm of sigmoid colon: Secondary | ICD-10-CM

## 2022-01-29 DIAGNOSIS — K648 Other hemorrhoids: Secondary | ICD-10-CM

## 2022-01-29 DIAGNOSIS — D120 Benign neoplasm of cecum: Secondary | ICD-10-CM | POA: Diagnosis not present

## 2022-01-29 MED ORDER — SODIUM CHLORIDE 0.9 % IV SOLN: 500.0000 mL | Freq: Once | INTRAVENOUS | Status: DC

## 2022-01-29 NOTE — Progress Notes (Signed)
Pt's states no medical or surgical changes since previsit or office visit. 

## 2022-01-29 NOTE — Progress Notes (Signed)
Called to room to assist during endoscopic procedure.  Patient ID and intended procedure confirmed with present staff. Received instructions for my participation in the procedure from the performing physician.  

## 2022-01-29 NOTE — Patient Instructions (Signed)
Resume previous diet and medications. Awaiting pathology results. Repeat Colonoscopy date to be determined based on pathology results.  YOU HAD AN ENDOSCOPIC PROCEDURE TODAY AT [facility]:   Refer to the procedure report that was given to you for any specific questions about what was found during the examination.  If the procedure report does not answer your questions, please call your gastroenterologist to clarify.  If you requested that your care partner not be given the details of your procedure findings, then the procedure report has been included in a sealed envelope for you to review at your convenience later.  YOU SHOULD EXPECT: Some feelings of bloating in the abdomen. Passage of more gas than usual.  Walking can help get rid of the air that was put into your GI tract during the procedure and reduce the bloating. If you had a lower endoscopy (such as a colonoscopy or flexible sigmoidoscopy) you may notice spotting of blood in your stool or on the toilet paper. If you underwent a bowel prep for your procedure, you may not have a normal bowel movement for a few days.  Please Note:  You might notice some irritation and congestion in your nose or some drainage.  This is from the oxygen used during your procedure.  There is no need for concern and it should clear up in a day or so.  SYMPTOMS TO REPORT IMMEDIATELY:  Following lower endoscopy (colonoscopy or flexible sigmoidoscopy):  Excessive amounts of blood in the stool  Significant tenderness or worsening of abdominal pains  Swelling of the abdomen that is new, acute  Fever of 100F or higher  For urgent or emergent issues, a gastroenterologist can be reached at any hour by calling [phone]. Do not use MyChart messaging for urgent concerns.    DIET:  We do recommend a small meal at first, but then you may proceed to your regular diet.  Drink plenty of fluids but you should avoid alcoholic beverages for 24  hours.  ACTIVITY:  You should plan to take it easy for the rest of today and you should NOT DRIVE or use heavy machinery until tomorrow (because of the sedation medicines used during the test).    FOLLOW UP: Our staff will call the number listed on your records the next business day following your procedure.  We will call around 7:15- 8:00 am to check on you and address any questions or concerns that you may have regarding the information given to you following your procedure. If we do not reach you, we will leave a message.  If you develop any symptoms (ie: fever, flu-like symptoms, shortness of breath, cough etc.) before then, please call [phone].  If you test positive for Covid 19 in the 2 weeks post procedure, please call and report this information to [place].    If any biopsies were taken you will be contacted by phone or by letter within the next 1-3 weeks.  Please call us at [phone] if you have not heard about the biopsies in 3 weeks.    SIGNATURES/CONFIDENTIALITY: You and/or your care partner have signed paperwork which will be entered into your electronic medical record.  These signatures attest to the fact that that the information above on your After Visit Summary has been reviewed and is understood.  Full responsibility of the confidentiality of this discharge information lies with you and/or your care-partner.

## 2022-01-29 NOTE — Op Note (Signed)
[place] Patient Name: Maureen Reyes Procedure Date: 01/29/2022 9:11 AM MRN: [id number] Endoscopist: Sonny Masters "Maureen Reyes ,  Age: 51 Referring MD:  Date of Birth: 1971-03-12 Gender: Female Account #: [id number] Procedure:                Colonoscopy Indications:              Positive fecal immunochemical test Medicines:                Monitored Anesthesia Care Procedure:                Pre-Anesthesia Assessment:                           - Prior to the procedure, a History and Physical                            was performed, and patient medications and                            allergies were reviewed. The patient's tolerance of                            previous anesthesia was also reviewed. The risks                            and benefits of the procedure and the sedation                            options and risks were discussed with the patient.                            All questions were answered, and informed consent                            was obtained. Prior Anticoagulants: The patient has                            taken no previous anticoagulant or antiplatelet                            agents. ASA Grade Assessment: II - A patient with                            mild systemic disease. After reviewing the risks                            and benefits, the patient was deemed in                            satisfactory condition to undergo the procedure.                           After obtaining informed consent, the colonoscope  was passed under direct vision. Throughout the                            procedure, the patient's blood pressure, pulse, and                            oxygen saturations were monitored continuously. The                            CF [id number] #[id number] was introduced through the anus                            and advanced to the the terminal ileum. The                            colonoscopy was performed  without difficulty. The                            patient tolerated the procedure well. The quality                            of the bowel preparation was good. The terminal                            ileum, ileocecal valve, appendiceal orifice, and                            rectum were photographed. Scope In: 9:22:35 AM Scope Out: 10:17:40 AM Scope Withdrawal Time: 0 hours 51 minutes 48 seconds  Total Procedure Duration: 0 hours 55 minutes 5 seconds  Findings:                 The terminal ileum appeared normal.                           Eight sessile polyps were found in the transverse                            colon, ascending colon and cecum. The polyps were 3                            to 10 mm in size. These polyps were removed with a                            cold snare. Resection and retrieval were complete.                           A 17 mm polyp was found in the ascending colon. The                            polyp was sessile. Preparations were made for  mucosal resection. Saline was injected to raise the                            lesion. Hot snare mucosal resection was performed.                            Resection and retrieval were complete. Area distal                            and on the opposite wall of the polyp was tattooed                            with an injection of 1 mL of Spot (carbon black).                           A 12 mm polyp was found in the ascending colon. The                            polyp was sessile. The polyp was removed with a hot                            snare. Resection and retrieval were complete.                           Five sessile polyps were found in the rectum and                            sigmoid colon. The polyps were 3 to 4 mm in size.                            These polyps were removed with a cold snare.                            Resection and retrieval were complete.                           A  few small-mouthed diverticula were found in the                            sigmoid colon, descending colon and transverse                            colon.                           Non-bleeding internal hemorrhoids were found during                            retroflexion. Complications:            No immediate complications. Estimated Blood Loss:     Estimated blood loss was minimal. Impression:               - The examined portion of  the ileum was normal.                           - Eight 3 to 10 mm polyps in the transverse colon,                            in the ascending colon and in the cecum, removed                            with a cold snare. Resected and retrieved.                           - One 17 mm polyp in the ascending colon, removed                            with mucosal resection. Resected and retrieved.                            Tattooed.                           - One 12 mm polyp in the ascending colon, removed                            with a hot snare. Resected and retrieved.                           - Five 3 to 4 mm polyps in the rectum and in the                            sigmoid colon, removed with a cold snare. Resected                            and retrieved.                           - Diverticulosis in the sigmoid colon, in the                            descending colon and in the transverse colon.                           - Non-bleeding internal hemorrhoids.                           - Mucosal resection was performed. Resection and                            retrieval were complete. Recommendation:           - Discharge patient to home (with escort).                           - Await pathology results.                           -  The findings and recommendations were discussed                            with the patient. Sonny Masters "Maureen Reyes,  01/29/2022 10:34:44 AM

## 2022-01-29 NOTE — Progress Notes (Signed)
GASTROENTEROLOGY PROCEDURE H&P NOTE   Primary Care Physician: Drucie Opitz, MD    Reason for Procedure:   Positive FIT  Plan:    Colonoscopy  Patient is appropriate for endoscopic procedure(s) in the ambulatory ([facility]) setting.  The nature of the procedure, as well as the risks, benefits, and alternatives were carefully and thoroughly reviewed with the patient. Ample time for discussion and questions allowed. The patient understood, was satisfied, and agreed to proceed.     HPI: Maureen Reyes is a 51 y.o. female who presents for colonoscopy for evaluation of positive FIT .  Patient was most recently seen in the Gastroenterology Clinic on 12/17/21.  No interval change in medical history since that appointment. Please refer to that note for full details regarding GI history and clinical presentation.   Past Medical History:  Diagnosis Date   Arthritis    Chronic headaches    HLD (hyperlipidemia)    Lumbar facet arthropathy    Neuropathy    Prediabetes    Sciatica of right side     Past Surgical History:  Procedure Laterality Date   TUBAL LIGATION  1990 pt reported    Prior to Admission medications   Medication Sig Start Date End Date Taking? Authorizing Provider  hydrOXYzine (ATARAX) 25 MG tablet Take 25 mg by mouth at bedtime. 11/04/21  Yes [provider]  rosuvastatin (CRESTOR) 20 MG tablet Take 1 tablet (20 mg total) by mouth daily. 11/26/21  Yes Wayland Denis, MD  gabapentin (NEURONTIN) 100 MG capsule Take 1 capsule (100 mg total) by mouth at bedtime. 11/26/20   Asencion Noble, MD  ibuprofen (ADVIL) 800 MG tablet Take 1 tablet (800 mg total) by mouth every 8 (eight) hours as needed. 07/24/21   Jose Persia, MD    Current Outpatient Medications  Medication Sig Dispense Refill   hydrOXYzine (ATARAX) 25 MG tablet Take 25 mg by mouth at bedtime.     rosuvastatin (CRESTOR) 20 MG tablet Take 1 tablet (20 mg total) by mouth daily. 90 tablet 2    gabapentin (NEURONTIN) 100 MG capsule Take 1 capsule (100 mg total) by mouth at bedtime. 90 capsule 3   ibuprofen (ADVIL) 800 MG tablet Take 1 tablet (800 mg total) by mouth every 8 (eight) hours as needed. 30 tablet 0   Current Facility-Administered Medications  Medication Dose Route Frequency Provider Last Rate Last Admin   0.9 %  sodium chloride infusion  500 mL Intravenous Once Sharyn Creamer, MD        Allergies as of 01/29/2022 - Review Complete 01/29/2022  Allergen Reaction Noted   Hydrocodone Nausea Only 05/28/2021    Family History  Problem Relation Age of Onset   Diabetes Mother    Diabetes Mellitus II Mother    Thyroid disease Mother    Colon polyps Mother    Diabetes Father    Diabetes Mellitus II Father    Diabetes Maternal Grandmother    Diabetes Maternal Grandfather    Cancer Maternal Grandfather        type unknown   Diabetes Paternal Grandmother    Diabetes Paternal Grandfather    Diabetes Maternal Aunt        several aunts and uncles   Heart disease Maternal Aunt    Diabetes Maternal Uncle        several aunts and uncles   Diabetes Paternal Uncle        several aunts and uncles   Diabetes Paternal 58  several aunts and uncles    Social History   Socioeconomic History   Marital status: Single    Spouse name: Not on file   Number of children: 3   Years of education: Not on file   Highest education level: Some college, no degree  Occupational History   Not on file  Tobacco Use   Smoking status: Former    Packs/day: 0.25    Years: 20.00    Total pack years: 5.00    Types: Cigarettes    Quit date: 07/01/2020    Years since quitting: 1.5   Smokeless tobacco: Never   Tobacco comments:    Stopped last year   Vaping Use   Vaping Use: Never used  Substance and Sexual Activity   Alcohol use: No   Drug use: No   Sexual activity: Not Currently    Birth control/protection: Surgical  Other Topics Concern   Not on file  Social History  Narrative   Not on file   Social Determinants of Health   Financial Resource Strain: Not on file  Food Insecurity: No Food Insecurity (07/31/2021)   Hunger Vital Sign    Worried About Running Out of Food in the Last Year: Never true    Ran Out of Food in the Last Year: Never true  Transportation Needs: No Transportation Needs (07/31/2021)   PRAPARE - Hydrologist (Medical): No    Lack of Transportation (Non-Medical): No  Physical Activity: Not on file  Stress: Not on file  Social Connections: Not on file  Intimate Partner Violence: Not on file    Physical Exam: Vital signs in last 24 hours: BP (!) 151/89   Pulse 71   Temp 98 F (36.7 C)   Ht '5\' 8"'$  (1.727 m)   Wt 263 lb (119.3 kg)   SpO2 96%   BMI 39.99 kg/m  GEN: NAD EYE: Sclerae anicteric ENT: MMM CV: Non-tachycardic Pulm: No increased WOB GI: Soft NEURO:  Alert & Oriented   [name], MD [facility]   01/29/2022 9:07 AM

## 2022-01-29 NOTE — Progress Notes (Signed)
PT taken to PACU. Monitors in place. VSS. Report given to RN. 

## 2022-01-30 ENCOUNTER — Telehealth: Payer: Self-pay

## 2022-01-30 NOTE — Telephone Encounter (Signed)
  Follow up Call-     01/29/2022    8:26 AM  Call back number  Post procedure Call Back phone  # [phone]  Permission to leave phone message Yes     Patient questions:  Do you have a fever, pain , or abdominal swelling? No. Pain Score  0 *  Have you tolerated food without any problems? Yes.    Have you been able to return to your normal activities? Yes.    Do you have any questions about your discharge instructions: Diet   No. Medications  No. Follow up visit  No.  Do you have questions or concerns about your Care? No.  Actions: * If pain score is 4 or above: No action needed, pain <4.

## 2022-01-31 ENCOUNTER — Encounter: Payer: Self-pay | Admitting: Internal Medicine

## 2022-04-01 ENCOUNTER — Other Ambulatory Visit (HOSPITAL_COMMUNITY): Payer: Self-pay

## 2022-04-01 ENCOUNTER — Ambulatory Visit: Payer: Self-pay

## 2022-04-01 ENCOUNTER — Other Ambulatory Visit: Payer: Self-pay

## 2022-04-01 VITALS — BP 169/94 | HR 60 | Temp 97.9°F | Ht 69.0 in | Wt 272.8 lb

## 2022-04-01 DIAGNOSIS — G6289 Other specified polyneuropathies: Secondary | ICD-10-CM

## 2022-04-01 DIAGNOSIS — E119 Type 2 diabetes mellitus without complications: Secondary | ICD-10-CM

## 2022-04-01 DIAGNOSIS — G8929 Other chronic pain: Secondary | ICD-10-CM

## 2022-04-01 DIAGNOSIS — E7800 Pure hypercholesterolemia, unspecified: Secondary | ICD-10-CM

## 2022-04-01 DIAGNOSIS — M545 Low back pain, unspecified: Secondary | ICD-10-CM

## 2022-04-01 DIAGNOSIS — E1165 Type 2 diabetes mellitus with hyperglycemia: Secondary | ICD-10-CM

## 2022-04-01 DIAGNOSIS — R030 Elevated blood-pressure reading, without diagnosis of hypertension: Secondary | ICD-10-CM

## 2022-04-01 DIAGNOSIS — Z7984 Long term (current) use of oral hypoglycemic drugs: Secondary | ICD-10-CM

## 2022-04-01 DIAGNOSIS — E782 Mixed hyperlipidemia: Secondary | ICD-10-CM

## 2022-04-01 DIAGNOSIS — E1142 Type 2 diabetes mellitus with diabetic polyneuropathy: Secondary | ICD-10-CM

## 2022-04-01 DIAGNOSIS — R195 Other fecal abnormalities: Secondary | ICD-10-CM

## 2022-04-01 LAB — POCT GLYCOSYLATED HEMOGLOBIN (HGB A1C): Hemoglobin A1C: 7.4 % — AB (ref 4.0–5.6)

## 2022-04-01 LAB — GLUCOSE, CAPILLARY: Glucose-Capillary: 178 mg/dL — ABNORMAL HIGH (ref 70–99)

## 2022-04-01 MED ORDER — METFORMIN HCL 500 MG PO TABS
ORAL_TABLET | ORAL | 11 refills | Status: DC
  Filled 2022-04-01: qty 60, 34d supply, fill #0
  Filled 2022-04-01: qty 60, 30d supply, fill #0
  Filled 2022-05-05: qty 60, 30d supply, fill #1
  Filled 2022-06-03: qty 60, 30d supply, fill #2
  Filled 2022-07-11: qty 60, 30d supply, fill #3

## 2022-04-01 NOTE — Patient Instructions (Addendum)
[name], it was a pleasure seeing you today!  Today we discussed: Type 2 diabetes: Your A1c has increased since your last visit. We will start Metformin 500 once a day for a week and then twice a day with meals thereafter. Please follow up in about three months for recheck of A1c and medication management. Hypertension: Your blood pressure was high again today. Please bring your at-home blood pressure cuff for [place] to ensure its accuracy. Also, please bring your log of at-home blood pressure readings. Cholesterol: We have obtained a repeat lipid panel to check your cholesterol. If your LDL is above 100, we will increase your Crestor to '40mg'$  daily. Hip pain: This is likely referred pain from your chronic low back pain. Please be cautious with the use of ibuprofen, preferably taking no more than two tablets before bedtime.  Neuropathy (left foot): Once your orange card is activated, we can discuss referral to neurology for nerve conduction testing.   I have ordered the following labs today:  Lab Orders         Glucose, capillary         BMP8+Anion Gap         Lipid Profile         POC Hbg A1C       Tests ordered today:  none  Referrals ordered today:   Referral Orders  No referral(s) requested today     I have ordered the following medication/changed the following medications:   Stop the following medications: There are no discontinued medications.   Start the following medications: Meds ordered this encounter  Medications   metFORMIN (GLUCOPHAGE) 500 MG tablet    Sig: Take 1 tablet (500 mg total) by mouth daily with breakfast for 7 days, THEN 1 tablet (500 mg total) 2 (two) times daily with a meal.    Dispense:  30 tablet    Refill:  11     Follow-up: 3 months   Please make sure to arrive 15 minutes prior to your next appointment. If you arrive late, you may be asked to reschedule.   We look forward to seeing you next time. Please call our clinic at  [phone] if you have any questions or concerns. The best time to call is Monday-Friday from 9am-4pm, but there is someone available 24/7. If after hours or the weekend, call the main hospital number and ask for the Internal Medicine Resident On-Call. If you need medication refills, please notify your pharmacy one week in advance and they will send [place] a request.  Thank you for letting us take part in your care. Wishing you the best!  Thank you, Linward Natal, MD

## 2022-04-01 NOTE — Assessment & Plan Note (Signed)
Patient with LDL of 154 at last check however had not been able to take Crestor at that point. Patient has now been taking this for several months. -Repeat lipid panel -Continue Crestor 20. Increase to 40 pending repeat panel. -LDL goal <100.

## 2022-04-01 NOTE — Assessment & Plan Note (Addendum)
Patient with history of chronic low right sided back pain. No red flag symptoms today. Pain does prevent her from getting her housework done. Takes 4 ibuprofen before bed to help with sleep. Endorses pain at right hip as will that extends to mid thigh. Mild tenderness to palpation of right hip on exam. Normal strength and sensation at right lower extremity on exam. Denies symptoms of sciatica currently. XR in 2012 showed facet joint arthropathy but no imaging since.  -Continue ibuprofen as needed but advised to cut down from 4 pills to 2 -Discussed potential benefits of duloxetine for her chronic pain. She would like to research this and revisit in the future. -Could consider referral for PT at next f/u if she has obtained insurance coverage at that time

## 2022-04-01 NOTE — Assessment & Plan Note (Signed)
Continues with peripheral neuropathy most notable at left dorsal foot. On gabapentin 100 mg daily. Previous workup negative (B12, MMA, HIV, and RPR). Symptoms had previously been well controlled on gabapentin.   -Discuss referral to neurology for nerve conduction study at next f/u if she has been able to procure orange card

## 2022-04-01 NOTE — Assessment & Plan Note (Signed)
Patient with multiple tubular adenomas on recent colonoscopy. Negative for high grade dysplasia.  -Repeat colonoscopy in one year per GI

## 2022-04-01 NOTE — Assessment & Plan Note (Addendum)
Blood pressure elevated again today to 153/95. Patient reports her home readings are consistently with systolics around 235 and diastolics in 36R to 44R. She reports her blood pressure is normal once she returns home from health appointments. She is to follow up in 3 months and was encouraged to bring her blood pressure cuff for calibration as well as a log of her at-home readings. She denies vision changes, headaches, chest pains.  -Discussed low salt diet -Discussed possible need for medication if this persists -reassess at f/u in 3 months with home cuff / readings

## 2022-04-01 NOTE — Progress Notes (Signed)
CC: T2 DM f/u  HPI:  [name] is a 51 y.o. female with a past medical history stated below who presents today for T2DM follow-up.  Please see problem-based assessment and plan for additional details.  Past Medical History:  Diagnosis Date   Arthritis    Chronic headaches    HLD (hyperlipidemia)    Lumbar facet arthropathy    Neuropathy    Prediabetes    Sciatica of right side    Review of Systems: ROS negative except for what is noted on the assessment plan.  Physical Exam:  Vitals:   04/01/22 1111 04/01/22 1203  BP: (!) 153/95 (!) 169/94  Pulse: 64 60  Temp: 97.9 F (36.6 C)   TempSrc: Oral   SpO2: 99%   Weight: 272 lb 12.8 oz (123.7 kg)   Height: '5\' 9"'$  (1.753 m)    Physical Exam Constitutional:      General: She is not in acute distress. HENT:     Head: Normocephalic and atraumatic.  Eyes:     Extraocular Movements: Extraocular movements intact.  Cardiovascular:     Rate and Rhythm: Normal rate and regular rhythm.  Pulmonary:     Effort: Pulmonary effort is normal.     Breath sounds: Normal breath sounds. No wheezing, rhonchi or rales.  Musculoskeletal:     Comments: Mild tenderness to palpation at right hip.  Feet:     Right foot:     Protective Sensation: 3 sites tested.  3 sites sensed.     Skin integrity: Skin integrity normal. No ulcer.     Left foot:     Protective Sensation: 3 sites tested.  3 sites sensed.     Skin integrity: Skin integrity normal. No ulcer.  Skin:    General: Skin is warm and dry.  Neurological:     Mental Status: She is alert and oriented to person, place, and time.  Psychiatric:        Mood and Affect: Mood normal.        Behavior: Behavior normal.      Assessment & Plan:   See Encounters Tab for problem based charting.  Maureen Reyes coat syndrome with high blood pressure but without hypertension Blood pressure elevated again today to 153/95. Patient reports her home readings are consistently with systolics  around 932 and diastolics in 35T to 73U. She reports her blood pressure is normal once she returns home from health appointments. She is to follow up in 3 months and was encouraged to bring her blood pressure cuff for calibration as well as a log of her at-home readings. She denies vision changes, headaches, chest pains.  -Discussed low salt diet -Discussed possible need for medication if this persists -reassess at f/u in 3 months with home cuff / readings  Uncontrolled type 2 diabetes mellitus with hyperglycemia (HCC) A1c up to 7.4% from 6.5% 4 months ago. Patient has continued with dietary changes and has introduced walking 30 minutes about 3 times a week. Despite this, her increased A1c warrants the addition of metformin which she is amenable to. She endorses increased urinary frequency, but denies polydipsia.   -Metformin 500 mg once daily for one week and then twice daily with meals thereafter -Repeat BMP today to monitor Cr -F/u in 3 months for A1c check and monitoring -Ophthalmology appointment in Oct -Foot exam completed today  Hypercholesteremia Patient with LDL of 154 at last check however had not been able to take Crestor at that point. Patient  has now been taking this for several months. -Repeat lipid panel -Continue Crestor 20. Increase to 40 pending repeat panel. -LDL goal <100.  Positive FIT (fecal immunochemical test) Patient with multiple tubular adenomas on recent colonoscopy. Negative for high grade dysplasia.  -Repeat colonoscopy in one year per GI  Chronic lower back pain Patient with history of chronic low right sided back pain. No red flag symptoms today. Pain does prevent her from getting her housework done. Takes 4 ibuprofen before bed to help with sleep. Endorses pain at right hip as will that extends to mid thigh. Mild tenderness to palpation of right hip on exam. Normal strength and sensation at right lower extremity on exam. Denies symptoms of sciatica  currently. XR in 2012 showed facet joint arthropathy but no imaging since.  -Continue ibuprofen as needed but advised to cut down from 4 pills to 2 -Discussed potential benefits of duloxetine for her chronic pain. She would like to research this and revisit in the future. -Could consider referral for PT at next f/u if she has obtained insurance coverage at that time  Peripheral neuropathy Continues with peripheral neuropathy most notable at left dorsal foot. On gabapentin 100 mg daily. Previous workup negative (B12, MMA, HIV, and RPR). Symptoms had previously been well controlled on gabapentin.   -Discuss referral to neurology for nerve conduction study at next f/u if she has been able to procure orange card    Patient seen with Dr. Evette Doffing

## 2022-04-01 NOTE — Assessment & Plan Note (Addendum)
A1c up to 7.4% from 6.5% 4 months ago. Patient has continued with dietary changes and has introduced walking 30 minutes about 3 times a week. Despite this, her increased A1c warrants the addition of metformin which she is amenable to. She endorses increased urinary frequency, but denies polydipsia.   -Metformin 500 mg once daily for one week and then twice daily with meals thereafter -Repeat BMP today to monitor Cr -F/u in 3 months for A1c check and monitoring -Ophthalmology appointment in Oct -Foot exam completed today

## 2022-04-02 LAB — LIPID PANEL
Chol/HDL Ratio: 2.9 ratio (ref 0.0–4.4)
Cholesterol, Total: 120 mg/dL (ref 100–199)
HDL: 41 mg/dL (ref >39)
LDL Chol Calc (NIH): 59 mg/dL (ref 0–99)
Triglycerides: 109 mg/dL (ref 0–149)
VLDL Cholesterol Cal: 20 mg/dL (ref 5–40)

## 2022-04-02 LAB — BMP8+ANION GAP
Anion Gap: 13 mmol/L (ref 10.0–18.0)
BUN/Creatinine Ratio: 11 (ref 9–23)
BUN: 9 mg/dL (ref 6–24)
CO2: 23 mmol/L (ref 20–29)
Calcium: 9.4 mg/dL (ref 8.7–10.2)
Chloride: 103 mmol/L (ref 96–106)
Creatinine, Ser: 0.83 mg/dL (ref 0.57–1.00)
Glucose: 177 mg/dL — ABNORMAL HIGH (ref 70–99)
Potassium: 4.5 mmol/L (ref 3.5–5.2)
Sodium: 139 mmol/L (ref 134–144)
eGFR: 85 mL/min/{1.73_m2} (ref >59)

## 2022-04-02 NOTE — Progress Notes (Signed)
Internal Medicine Clinic Attending   I saw and evaluated the patient.  I personally confirmed the key portions of the history and exam documented by Dr. White and I reviewed pertinent patient test results.  The assessment, diagnosis, and plan were formulated together and I agree with the documentation in the resident's note.  

## 2022-04-21 ENCOUNTER — Telehealth: Payer: Self-pay

## 2022-04-21 ENCOUNTER — Other Ambulatory Visit: Payer: Self-pay

## 2022-04-21 DIAGNOSIS — Z1231 Encounter for screening mammogram for malignant neoplasm of breast: Secondary | ICD-10-CM

## 2022-04-21 NOTE — Telephone Encounter (Signed)
Health Coaching 3    Goals- Patient has been walking 3 times a week on her treadmill at home. Patient has been trying to maintain a well balanced diet. Patient has been taking cholesterol and blood sugar lowering medication daily.    New goal- Patient is tracking BP daily foe upcoming Dr.'s appointment to determine is elevated BP is more of a white coat syndrome.   Barrier to reaching goal-    Strategies to overcome-    Navigation:  Patient is aware of  a follow up session. Patient is scheduled for FU visit on 05/26/22 @ 1:00 pm.   Time-  10 minutes

## 2022-05-05 ENCOUNTER — Other Ambulatory Visit: Payer: Self-pay | Admitting: Student

## 2022-05-05 DIAGNOSIS — L209 Atopic dermatitis, unspecified: Secondary | ICD-10-CM

## 2022-05-05 LAB — HM DIABETES EYE EXAM

## 2022-05-06 ENCOUNTER — Other Ambulatory Visit (HOSPITAL_COMMUNITY): Payer: Self-pay

## 2022-05-07 ENCOUNTER — Other Ambulatory Visit: Payer: Self-pay | Admitting: Internal Medicine

## 2022-05-07 ENCOUNTER — Other Ambulatory Visit (HOSPITAL_COMMUNITY): Payer: Self-pay

## 2022-05-07 DIAGNOSIS — L209 Atopic dermatitis, unspecified: Secondary | ICD-10-CM

## 2022-05-07 MED ORDER — HYDROXYZINE HCL 25 MG PO TABS
25.0000 mg | ORAL_TABLET | Freq: Every day | ORAL | 1 refills | Status: DC
  Filled 2022-05-07: qty 30, 30d supply, fill #0

## 2022-05-07 MED ORDER — HYDROXYZINE HCL 25 MG PO TABS
25.0000 mg | ORAL_TABLET | Freq: Every day | ORAL | 1 refills | Status: DC
  Filled 2022-05-07: qty 30, 30d supply, fill #0
  Filled 2022-06-06: qty 30, 30d supply, fill #1

## 2022-05-08 ENCOUNTER — Other Ambulatory Visit (HOSPITAL_COMMUNITY): Payer: Self-pay

## 2022-05-26 ENCOUNTER — Inpatient Hospital Stay: Payer: Self-pay | Attending: Obstetrics and Gynecology | Admitting: *Deleted

## 2022-05-26 VITALS — BP 130/84 | Ht 68.0 in | Wt 268.1 lb

## 2022-05-26 DIAGNOSIS — Z0000 Encounter for general adult medical examination without abnormal findings: Secondary | ICD-10-CM

## 2022-05-26 NOTE — Progress Notes (Unsigned)
[facility] follow up   Interpreter: Rudene Anda, Erling Cruz  Clinical Measurement:   Vitals:   05/26/22 1259  BP: 122/84      Medical History:  Patient states that she does not have high cholesterol, does not have high blood pressure and she does not have diabetes.  Medications:  Patient states that she does not take medication to lower cholesterol, blood pressure and blood sugar.  Patient does not take an aspirin a day to help prevent a heart attack or stroke.    Blood pressure, self measurement: Patient states that she   measure blood pressure from home. She checks her blood pressure  . She shares her readings with a health care provider:  .   Nutrition: Patient states that on average she eats *** cups of fruit and *** cups of vegetables per day. Patient states that she {Blank single:19197::"does","does not"} eat fish at least 2 times per week. Patient eats {Blank single:19197::"less than half","about half","more than half"} servings of whole grains. Patient drinks less than 36 ounces of beverages with added sugar weekly: {Blank single:19197::"yes","no"}. Patient is currently watching sodium or salt intake: {Blank single:19197::"yes","no"}. In the past 7 days patient has had *** drinks containing alcohol. On average patient drinks *** drinks containing alcohol per day.      Physical activity:  Patient states that she gets *** minutes of moderate and *** minutes of vigorous physical activity each week.  Smoking status:  Patient states that she has {Blank single:19197::"has never smoked","is a former smoker, quit 1-12 months ago","is a former smoker, quit 12+ months ago","is a current smoker"} .   Quality of life:  Over the past 2 weeks patient states that she had little interest or pleasure in doing things: {Blank single:19197::"not at all","several days","more than half","nearly everyday"}. She has been feeling down, depressed or hopeless:{Blank single:19197::"not at all","several  days","more than half","nearly everyday"}.    Risk reduction and counseling: ***    Navigation: This was the  follow up session for this patient, I will check up on her progress in the coming months.  Time: *** minutes

## 2022-05-28 ENCOUNTER — Other Ambulatory Visit: Payer: Self-pay

## 2022-05-28 DIAGNOSIS — E782 Mixed hyperlipidemia: Secondary | ICD-10-CM

## 2022-05-28 MED ORDER — ROSUVASTATIN CALCIUM 20 MG PO TABS
20.0000 mg | ORAL_TABLET | Freq: Every day | ORAL | 2 refills | Status: DC
  Filled 2022-05-28: qty 90, 90d supply, fill #0
  Filled 2022-09-01: qty 30, 30d supply, fill #1
  Filled 2022-09-30: qty 30, 30d supply, fill #2
  Filled 2022-10-31: qty 30, 30d supply, fill #3
  Filled 2022-12-01: qty 30, 30d supply, fill #4
  Filled 2022-12-30: qty 30, 30d supply, fill #5
  Filled 2023-02-01: qty 30, 30d supply, fill #6

## 2022-05-28 NOTE — Telephone Encounter (Signed)
Please send rx to alternative pharmacy

## 2022-05-29 ENCOUNTER — Other Ambulatory Visit (HOSPITAL_COMMUNITY): Payer: Self-pay

## 2022-06-03 ENCOUNTER — Ambulatory Visit: Payer: Self-pay | Admitting: *Deleted

## 2022-06-03 ENCOUNTER — Ambulatory Visit
Admission: RE | Admit: 2022-06-03 | Discharge: 2022-06-03 | Disposition: A | Discharge status: Home / Self Care | Payer: No Typology Code available for payment source | Source: Ambulatory Visit | Attending: Obstetrics and Gynecology | Admitting: Obstetrics and Gynecology

## 2022-06-03 ENCOUNTER — Other Ambulatory Visit (HOSPITAL_COMMUNITY): Payer: Self-pay

## 2022-06-03 ENCOUNTER — Encounter: Payer: Self-pay | Admitting: Dietician

## 2022-06-03 VITALS — BP 130/82 | Wt 269.3 lb

## 2022-06-03 DIAGNOSIS — Z1239 Encounter for other screening for malignant neoplasm of breast: Secondary | ICD-10-CM

## 2022-06-03 DIAGNOSIS — Z1231 Encounter for screening mammogram for malignant neoplasm of breast: Secondary | ICD-10-CM

## 2022-06-03 NOTE — Progress Notes (Signed)
[name] is a 51 y.o. female who presents to [facility] clinic today with no complaints.    Pap Smear: Pap smear not completed today. Last Pap smear was 04/17/2020 at [facility] clinic and was normal with negative HPV. Per patient has no history of an abnormal Pap smear. Last Pap smear result is available in Epic.   Physical exam: Breasts Breasts symmetrical. No skin abnormalities bilateral breasts. No nipple retraction bilateral breasts. No nipple discharge bilateral breasts. No lymphadenopathy. No lumps palpated bilateral breasts. No complaints of pain or tenderness on exam.   Pelvic/Bimanual Pap is not indicated today per BCCCP guidelines.   Smoking History: Patient is a former smoker that quit 07/01/2020.   Patient Navigation: Patient education provided. Access to services provided for patient through [facility] program.   Colorectal Cancer Screening: Per patient has had colonoscopy completed on 01/29/2022. Patient completed a FIT Test 05/28/2021 that was positive.  No complaints today.    Breast and Cervical Cancer Risk Assessment: Patient does not have family history of breast cancer, known genetic mutations, or radiation treatment to the chest before age 86. Patient does not have history of cervical dysplasia, immunocompromised, or DES exposure in-utero.  Risk Assessment     Risk Scores       06/03/2022 05/28/2021   Last edited by: Royston Bake, CMA [place], Garvin Fila, RT   5-year risk: 1.3 % 1.2 %   Lifetime risk: 8.2 % 8.3 %            A: BCCCP exam without pap smear No complaints.  P: Referred patient to the [facility] for a screening mammogram on mobile unit. Appointment scheduled Tuesday, June 03, 2022 at 1500.  Loletta Parish, RN 06/03/2022 2:33 PM

## 2022-06-03 NOTE — Patient Instructions (Signed)
Explained breast self awareness with [name]. Patient did not need a Pap smear today due to last Pap smear and HPV typing was 04/17/2020. Let her know BCCCP will cover Pap smears and HPV typing every 5 years unless has a history of abnormal Pap smears. Referred patient to the [facility] for a screening mammogram on mobile unit. Appointment scheduled Tuesday, June 03, 2022 at 1500. Patient aware of appointment and will be there. Let [facility] know [facility] will follow up with her within the next couple weeks with results of her mammogram by letter or phone. Maureen Reyes verbalized understanding.  Vimal Derego, Arvil Chaco, RN 2:33 PM

## 2022-06-06 ENCOUNTER — Other Ambulatory Visit (HOSPITAL_COMMUNITY): Payer: Self-pay

## 2022-06-09 ENCOUNTER — Other Ambulatory Visit (HOSPITAL_COMMUNITY): Payer: Self-pay

## 2022-06-13 ENCOUNTER — Other Ambulatory Visit (HOSPITAL_COMMUNITY): Payer: Self-pay

## 2022-07-11 ENCOUNTER — Other Ambulatory Visit (HOSPITAL_COMMUNITY): Payer: Self-pay

## 2022-07-11 ENCOUNTER — Other Ambulatory Visit: Payer: Self-pay

## 2022-07-15 ENCOUNTER — Other Ambulatory Visit (HOSPITAL_COMMUNITY): Payer: Self-pay

## 2022-07-15 ENCOUNTER — Other Ambulatory Visit: Payer: Self-pay

## 2022-07-15 ENCOUNTER — Ambulatory Visit: Payer: Self-pay

## 2022-07-15 VITALS — BP 119/73 | HR 73 | Temp 98.6°F | Resp 20 | Ht 69.0 in | Wt 267.3 lb

## 2022-07-15 DIAGNOSIS — G6289 Other specified polyneuropathies: Secondary | ICD-10-CM

## 2022-07-15 DIAGNOSIS — E1142 Type 2 diabetes mellitus with diabetic polyneuropathy: Secondary | ICD-10-CM

## 2022-07-15 DIAGNOSIS — E119 Type 2 diabetes mellitus without complications: Secondary | ICD-10-CM

## 2022-07-15 DIAGNOSIS — Z7984 Long term (current) use of oral hypoglycemic drugs: Secondary | ICD-10-CM

## 2022-07-15 DIAGNOSIS — E1165 Type 2 diabetes mellitus with hyperglycemia: Secondary | ICD-10-CM

## 2022-07-15 DIAGNOSIS — Z23 Encounter for immunization: Secondary | ICD-10-CM

## 2022-07-15 LAB — POCT GLYCOSYLATED HEMOGLOBIN (HGB A1C): Hemoglobin A1C: 7.1 % — AB (ref 4.0–5.6)

## 2022-07-15 LAB — GLUCOSE, CAPILLARY: Glucose-Capillary: 125 mg/dL — ABNORMAL HIGH (ref 70–99)

## 2022-07-15 MED ORDER — BLOOD GLUCOSE MONITOR SYSTEM W/DEVICE KIT
1.0000 | PACK | Freq: Once | 0 refills | Status: AC
  Filled 2022-07-15: qty 1, 30d supply, fill #0

## 2022-07-15 MED ORDER — GABAPENTIN 100 MG PO CAPS
300.0000 mg | ORAL_CAPSULE | Freq: Every day | ORAL | 3 refills | Status: DC
  Filled 2022-07-15: qty 90, 30d supply, fill #0
  Filled 2022-09-01: qty 90, 30d supply, fill #1
  Filled 2022-09-30: qty 90, 30d supply, fill #2
  Filled 2022-10-31: qty 90, 30d supply, fill #3

## 2022-07-15 NOTE — Assessment & Plan Note (Signed)
Continues with peripheral neuropathy most notable at left dorsal foot. No trauma, bruising, swelling per her reports. Also has shooting, shock like pain in bilateral upper and lower extremities that seems to be connected temporally. Exam without reduced strength or sensation. On gabapentin 100 mg daily which does provide some relief. Previous workup negative (B12, MMA, HIV, and RPR).   -increase gabapentin to 300 mg nightly  -consider c-spine xr at next visit -Could discuss neurology referral / nerve conduction study at next visit once obtained medicaid -fu symptoms in 3 months

## 2022-07-15 NOTE — Patient Instructions (Addendum)
[name], it was a pleasure seeing you today!  Today we discussed: Diabetes-Continue with metformin 500 mg twice a day. Will call with A1c results and discuss and necessary med changes. Shooting pain- Will increase gabapentin to 300 mg nightly.  I have ordered the following labs today:  Lab Orders         POC Hbg A1C      Tests ordered today:  A1c  Referrals ordered today:   Referral Orders  No referral(s) requested today     I have ordered the following medication/changed the following medications:   Stop the following medications: Medications Discontinued During This Encounter  Medication Reason   gabapentin (NEURONTIN) 100 MG capsule Reorder     Start the following medications: Meds ordered this encounter  Medications   Blood Glucose Monitoring Suppl DEVI    Sig: 1 Device by Does not apply route once for 1 dose.    Dispense:  1 each    Refill:  0    Trumetrics monitor (IM program)   gabapentin (NEURONTIN) 100 MG capsule    Sig: Take 3 capsules (300 mg total) by mouth at bedtime.    Dispense:  90 capsule    Refill:  3    IM program     Follow-up: 3 months   Please make sure to arrive 15 minutes prior to your next appointment. If you arrive late, you may be asked to reschedule.   We look forward to seeing you next time. Please call our clinic at [phone] if you have any questions or concerns. The best time to call is Monday-Friday from 9am-4pm, but there is someone available 24/7. If after hours or the weekend, call the main hospital number and ask for the Internal Medicine Resident On-Call. If you need medication refills, please notify your pharmacy one week in advance and they will send [place] a request.  Thank you for letting us take part in your care. Wishing you the best!  Thank you, Linward Natal, MD

## 2022-07-15 NOTE — Assessment & Plan Note (Addendum)
Continues with improved diet and exercise. Polyuria improved. Continues with mild polydipsia. No side effects with metformin at 500 mg bid. Saw ophthalmology recently (since last visit). Foot exam wnl at last visit.  -repeat A1c pending -continue metformin 500 mg bid for now -Consider glp-1 agonist if A1c not at goal on current regimen vs increasing metformin -ordered glucose monitor

## 2022-07-15 NOTE — Progress Notes (Signed)
   CC: DMII check  HPI:  Maureen Reyes is a 51 y.o.   Past Medical History:  Diagnosis Date   Arthritis    Chronic headaches    HLD (hyperlipidemia)    Lumbar facet arthropathy    Neuropathy    Prediabetes    Sciatica of right side    Review of Systems:  See detailed assessment and plan for pertinent ROS.  Physical Exam:  Vitals:   07/15/22 1410  BP: 119/73  Pulse: 73  Resp: 20  Temp: 98.6 F (37 C)  TempSrc: Skin  SpO2: 100%  Weight: 267 lb 4.8 oz (121.2 kg)  Height: '5\' 9"'$  (1.753 m)   Physical Exam Constitutional:      General: She is not in acute distress. HENT:     Head: Normocephalic and atraumatic.  Eyes:     Extraocular Movements: Extraocular movements intact.  Cardiovascular:     Rate and Rhythm: Normal rate and regular rhythm.  Pulmonary:     Effort: Pulmonary effort is normal.     Breath sounds: Normal breath sounds.  Musculoskeletal:     Cervical back: Neck supple. No rigidity.  Skin:    General: Skin is warm and dry.  Neurological:     Mental Status: She is alert and oriented to person, place, and time.     Sensory: No sensory deficit.     Motor: No weakness.  Psychiatric:        Mood and Affect: Mood normal.        Behavior: Behavior normal.      Assessment & Plan:   See Encounters Tab for problem based charting.  Type 2 diabetes mellitus without complication, without long-term current use of insulin (HCC) -     POCT glycosylated hemoglobin (Hb A1C)  Need for immunization against influenza -     Flu Vaccine QUAD 28moIM (Fluarix, Fluzone & Alfiuria Quad PF)  Uncontrolled type 2 diabetes mellitus with hyperglycemia ([place] Assessment & Plan: Continues with improved diet and exercise. Polyuria improved. Continues with mild polydipsia. No side effects with metformin at 500 mg bid. Saw ophthalmology recently (since last visit). Foot exam wnl at last visit.  -repeat A1c pending -continue metformin 500 mg bid for now -Consider  glp-1 agonist if A1c not at goal on current regimen vs increasing metformin -ordered glucose monitor   Other polyneuropathy Assessment & Plan: Continues with peripheral neuropathy most notable at left dorsal foot. No trauma, bruising, swelling per her reports. Also has shooting, shock like pain in bilateral upper and lower extremities that seems to be connected temporally. Exam without reduced strength or sensation. On gabapentin 100 mg daily which does provide some relief. Previous workup negative (B12, MMA, HIV, and RPR).   -increase gabapentin to 300 mg nightly  -consider c-spine xr at next visit -Could discuss neurology referral / nerve conduction study at next visit once obtained medicaid -fu symptoms in 3 months    Other orders -     Blood Glucose Monitoring Suppl; 1 Device by Does not apply route once for 1 dose.  Dispense: 1 each; Refill: 0 -     Gabapentin; Take 3 capsules (300 mg total) by mouth at bedtime.  Dispense: 90 capsule; Refill: 3 -     Glucose, capillary     Patient seen with Dr. NDareen Piano

## 2022-07-16 ENCOUNTER — Other Ambulatory Visit (HOSPITAL_COMMUNITY): Payer: Self-pay

## 2022-07-16 ENCOUNTER — Telehealth: Payer: Self-pay | Admitting: *Deleted

## 2022-07-17 ENCOUNTER — Other Ambulatory Visit (HOSPITAL_COMMUNITY): Payer: Self-pay

## 2022-07-17 ENCOUNTER — Other Ambulatory Visit: Payer: Self-pay

## 2022-07-17 DIAGNOSIS — E119 Type 2 diabetes mellitus without complications: Secondary | ICD-10-CM

## 2022-07-17 MED ORDER — METFORMIN HCL 500 MG PO TABS
ORAL_TABLET | ORAL | 11 refills | Status: DC
  Filled 2022-07-17: qty 30, 8d supply, fill #0

## 2022-07-17 MED ORDER — TRUEPLUS 5-BEVEL PEN NEEDLES 31G X 5 MM MISC: 1.0000 | 0 refills | Status: AC | PRN

## 2022-07-17 MED ORDER — TRUEPLUS LANCETS 30G MISC
1.0000 | Freq: Every day | 2 refills | Status: DC
  Filled 2022-07-17: qty 100, 90d supply, fill #0

## 2022-07-17 MED ORDER — GNP TRUE METRIX GLUCOSE STRIPS VI STRP: ORAL_STRIP | 12 refills | Status: DC

## 2022-07-17 NOTE — Progress Notes (Signed)
Internal Medicine Clinic Attending   I saw and evaluated the patient.  I personally confirmed the key portions of the history and exam documented by Dr. White and I reviewed pertinent patient test results.  The assessment, diagnosis, and plan were formulated together and I agree with the documentation in the resident's note.  

## 2022-07-17 NOTE — Addendum Note (Signed)
Addended by: Linward Natal on: 07/17/2022 03:23 PM   Modules accepted: Orders

## 2022-07-17 NOTE — Addendum Note (Signed)
Addended by: Linward Natal on: 07/17/2022 03:22 PM   Modules accepted: Orders

## 2022-07-18 ENCOUNTER — Other Ambulatory Visit (HOSPITAL_COMMUNITY): Payer: Self-pay

## 2022-07-18 MED ORDER — METFORMIN HCL 1000 MG PO TABS
1000.0000 mg | ORAL_TABLET | Freq: Two times a day (BID) | ORAL | 5 refills | Status: DC
  Filled 2022-07-18: qty 60, 30d supply, fill #0
  Filled 2022-09-01: qty 60, 30d supply, fill #1
  Filled 2022-09-30: qty 60, 30d supply, fill #2
  Filled 2022-10-31: qty 60, 30d supply, fill #3
  Filled 2022-12-01: qty 60, 30d supply, fill #4
  Filled 2022-12-30: qty 60, 30d supply, fill #5
  Filled 2023-02-01: qty 60, 30d supply, fill #6
  Filled 2023-02-27: qty 60, 30d supply, fill #7
  Filled 2023-04-07: qty 60, 30d supply, fill #8
  Filled 2023-05-26: qty 60, 30d supply, fill #9
  Filled 2023-06-30: qty 60, 30d supply, fill #10

## 2022-07-18 MED ORDER — METFORMIN HCL 1000 MG PO TABS: 1000.0000 mg | ORAL_TABLET | Freq: Two times a day (BID) | ORAL | 5 refills | Status: DC

## 2022-07-18 MED ORDER — GNP TRUE METRIX GLUCOSE STRIPS VI STRP
ORAL_STRIP | 12 refills | Status: AC
  Filled 2022-07-18: qty 50, 50d supply, fill #0
  Filled 2022-09-01: qty 50, 50d supply, fill #1
  Filled 2022-10-02: qty 100, fill #2
  Filled 2022-10-06: qty 50, 50d supply, fill #2
  Filled 2022-12-15 – 2022-12-31 (×2): qty 50, 30d supply, fill #3

## 2022-07-18 MED ORDER — TRUEPLUS LANCETS 30G MISC
1.0000 | Freq: Every day | 2 refills | Status: DC
  Filled 2022-07-18: qty 100, 100d supply, fill #0

## 2022-07-18 MED ORDER — GNP TRUE METRIX GLUCOSE STRIPS VI STRP
ORAL_STRIP | 12 refills | Status: DC
  Filled 2022-07-18: qty 100, fill #0

## 2022-07-18 NOTE — Telephone Encounter (Signed)
Increased metforming to 1000 mg twice daily. Refilled lancets.

## 2022-07-18 NOTE — Addendum Note (Signed)
Addended by: Linward Natal on: 07/18/2022 09:14 AM   Modules accepted: Orders

## 2022-07-24 NOTE — Telephone Encounter (Signed)
Called patient she was able to pickup test kit

## 2022-07-25 ENCOUNTER — Other Ambulatory Visit (HOSPITAL_COMMUNITY): Payer: Self-pay

## 2022-07-30 ENCOUNTER — Telehealth: Payer: Self-pay | Admitting: Student

## 2022-07-30 NOTE — Telephone Encounter (Signed)
Pharmacy requesting a call back about the which medication is needed listed below and how may times the patient is to be testing.   Insulin Pen Needle (TRUEPLUS 5-BEVEL PEN NEEDLES) 31G X 5 MM MISC   TRUEplus Lancets 30G MISC    [id number] [address], [place] - [address] AT [facility] (Ph: [phone])

## 2022-07-30 NOTE — Telephone Encounter (Signed)
Lancets and pen needles were sent to [facility]. Patient states she already p/u lancets and pen needles from [facility]. Confirmed with patient this is the only pharmacy she uses. All others have been removed from list.

## 2022-09-01 ENCOUNTER — Other Ambulatory Visit: Payer: Self-pay

## 2022-09-01 ENCOUNTER — Other Ambulatory Visit (HOSPITAL_COMMUNITY): Payer: Self-pay

## 2022-10-02 ENCOUNTER — Other Ambulatory Visit (HOSPITAL_COMMUNITY): Payer: Self-pay

## 2022-10-06 ENCOUNTER — Other Ambulatory Visit (HOSPITAL_COMMUNITY): Payer: Self-pay

## 2022-10-14 ENCOUNTER — Other Ambulatory Visit (HOSPITAL_COMMUNITY): Payer: Self-pay

## 2022-10-14 ENCOUNTER — Ambulatory Visit: Payer: 59 | Admitting: Student

## 2022-10-14 VITALS — BP 119/79 | HR 70 | Temp 98.0°F | Ht 69.0 in | Wt 258.1 lb

## 2022-10-14 DIAGNOSIS — E1165 Type 2 diabetes mellitus with hyperglycemia: Secondary | ICD-10-CM

## 2022-10-14 DIAGNOSIS — G589 Mononeuropathy, unspecified: Secondary | ICD-10-CM

## 2022-10-14 DIAGNOSIS — E1141 Type 2 diabetes mellitus with diabetic mononeuropathy: Secondary | ICD-10-CM | POA: Diagnosis not present

## 2022-10-14 DIAGNOSIS — E7800 Pure hypercholesterolemia, unspecified: Secondary | ICD-10-CM | POA: Diagnosis not present

## 2022-10-14 DIAGNOSIS — E1142 Type 2 diabetes mellitus with diabetic polyneuropathy: Secondary | ICD-10-CM | POA: Diagnosis not present

## 2022-10-14 DIAGNOSIS — E119 Type 2 diabetes mellitus without complications: Secondary | ICD-10-CM

## 2022-10-14 DIAGNOSIS — R030 Elevated blood-pressure reading, without diagnosis of hypertension: Secondary | ICD-10-CM

## 2022-10-14 DIAGNOSIS — G629 Polyneuropathy, unspecified: Secondary | ICD-10-CM

## 2022-10-14 DIAGNOSIS — Z23 Encounter for immunization: Secondary | ICD-10-CM

## 2022-10-14 DIAGNOSIS — Z0000 Encounter for general adult medical examination without abnormal findings: Secondary | ICD-10-CM

## 2022-10-14 LAB — GLUCOSE, CAPILLARY: Glucose-Capillary: 147 mg/dL — ABNORMAL HIGH (ref 70–99)

## 2022-10-14 LAB — POCT GLYCOSYLATED HEMOGLOBIN (HGB A1C): Hemoglobin A1C: 6.8 % — AB (ref 4.0–5.6)

## 2022-10-14 MED ORDER — PEN NEEDLES 32G X 4 MM MISC
1.0000 | 1 refills | Status: DC
  Filled 2022-10-14: qty 30, fill #0

## 2022-10-14 MED ORDER — SEMAGLUTIDE(0.25 OR 0.5MG/DOS) 2 MG/3ML ~~LOC~~ SOPN
0.2500 mg | PEN_INJECTOR | SUBCUTANEOUS | 2 refills | Status: DC
  Filled 2022-10-14: qty 3, fill #0

## 2022-10-14 MED ORDER — SEMAGLUTIDE(0.25 OR 0.5MG/DOS) 2 MG/3ML ~~LOC~~ SOPN
0.2500 mg | PEN_INJECTOR | SUBCUTANEOUS | 2 refills | Status: DC
  Filled 2022-10-14: qty 3, 42d supply, fill #0
  Filled 2022-10-15: qty 3, 28d supply, fill #0
  Filled 2022-11-08: qty 3, 28d supply, fill #1
  Filled 2022-12-15: qty 3, 28d supply, fill #2

## 2022-10-14 MED ORDER — ZOSTER VAC RECOMB ADJUVANTED 50 MCG/0.5ML IM SUSR: 0.5000 mL | Freq: Once | INTRAMUSCULAR | 0 refills | Status: AC

## 2022-10-14 NOTE — Patient Instructions (Addendum)
Thank you, [name] for allowing [place] to provide your care today. Today we discussed.  Diabetes You are doing great! We will start you on ozempic. Continue your metformin and follow up with [place] in 2-3 months.   Left foot pain For now, I would recommend taking ibuprofen 400 mg every 6 hours for the next 4-5 days. If you have new symptoms or no improvement please call back to see [place] in the next 2-3 weeks.   I have ordered the following labs for you:   Lab Orders         Glucose, capillary         POC Hbg A1C      Referrals ordered today:   Referral Orders  No referral(s) requested today     I have ordered the following medication/changed the following medications:   Stop the following medications: Medications Discontinued During This Encounter  Medication Reason   Semaglutide,0.25 or 0.5MG /DOS, 2 MG/3ML SOPN Reorder     Start the following medications: Meds ordered this encounter  Medications   DISCONTD: Semaglutide,0.25 or 0.5MG /DOS, 2 MG/3ML SOPN    Sig: Inject 0.25 mg into the skin once a week.    Dispense:  3 mL    Refill:  2   Semaglutide,0.25 or 0.5MG /DOS, 2 MG/3ML SOPN    Sig: Inject 0.25 mg into the skin once a week. Increase after 4 weeks if tolerating well .5mg     Dispense:  3 mL    Refill:  2     Follow up: 2-3 months please return sooner  Should you have any questions or concerns please call the internal medicine clinic at [phone].    Sanjuana Letters, D.O. [place]  .

## 2022-10-14 NOTE — Assessment & Plan Note (Signed)
Assessment: LDL at goal for primary prevention  Plan: - continue rosuvastatin 20 mg daily

## 2022-10-14 NOTE — Assessment & Plan Note (Signed)
Assessment: A1c of 6.8% today from 7.1% Congratulated her on this. She has been working on eating a healthier diet and exercising frequently with a treadmill and exercise bike. She has been adherent to metformin 1000 mg BID without GI side effects. Believe she would do well with a GLP-1 agonist and she is interested in this. Will start ozempic .25mg  and up titrate to .5mg  if she tolerates well without side effects. Hopeful that if she continues to work on lifestyle modifications we would be able to titrate off of the metformin completely  Plan: - continue metformin 1000 mg BID - start ozempic .25 weekly, up titrate to .5 mg as able - foot exam unremarkable - A1c in 3 months

## 2022-10-14 NOTE — Progress Notes (Signed)
CC: follow up diabetes and polyneuropathy  HPI:  [name] is a 52 y.o. female living with a history stated below and presents today for diabetes and polyneuropathy. Please see problem based assessment and plan for additional details.  Past Medical History:  Diagnosis Date   Arthritis    Chronic headaches    HLD (hyperlipidemia)    Lumbar facet arthropathy    Neuropathy    Prediabetes    Sciatica of right side     Current Outpatient Medications on File Prior to Visit  Medication Sig Dispense Refill   gabapentin (NEURONTIN) 100 MG capsule Take 3 capsules (300 mg total) by mouth at bedtime. 90 capsule 3   glucose blood (GNP TRUE METRIX GLUCOSE STRIPS) test strip Use as instructed 100 each 12   glucose blood (GNP TRUE METRIX GLUCOSE STRIPS) test strip Use as instructed to check blood sugar. 100 each 12   hydrOXYzine (ATARAX) 25 MG tablet Take 1 tablet (25 mg total) by mouth at bedtime. 30 tablet 1   ibuprofen (ADVIL) 800 MG tablet Take 1 tablet (800 mg total) by mouth every 8 (eight) hours as needed. 30 tablet 0   Insulin Pen Needle (TRUEPLUS 5-BEVEL PEN NEEDLES) 31G X 5 MM MISC 1 Box by Does not apply route as needed. 1 each 0   metFORMIN (GLUCOPHAGE) 1000 MG tablet Take 1 tablet (1,000 mg total) by mouth 2 (two) times daily with a meal. 120 tablet 5   rosuvastatin (CRESTOR) 20 MG tablet Take 1 tablet (20 mg total) by mouth daily. 90 tablet 2   TRUEplus Lancets 30G MISC Use to check blood sugar once daily. 100 each 2   No current facility-administered medications on file prior to visit.    Review of Systems: ROS negative except for what is noted on the assessment and plan.  Vitals:   10/14/22 1400  BP: 119/79  Pulse: 70  Temp: 98 F (36.7 C)  TempSrc: Oral  SpO2: 99%  Weight: 258 lb 1.6 oz (117.1 kg)  Height: 5\' 9"  (1.753 m)    Physical Exam: Constitutional: well-appearing, in no acute distress HENT: normocephalic atraumatic, mucous membranes moist Eyes:  conjunctiva non-erythematous Neck: supple Pulmonary/Chest: normal work of breathing on room air Abdominal: soft, non-tender, non-distended MSK: normal bulk and tone. Left foot without bony deformity. No skin changes. No tenderness to palpation. 2+ DP pulse. Neurological: alert & oriented x 3, normal sensation in feet.  Skin: warm and dry Psych: normal mod.   Assessment & Plan:   Uncontrolled type 2 diabetes mellitus with hyperglycemia (HCC) Assessment: A1c of 6.8% today from 7.1% Congratulated her on this. She has been working on eating a healthier diet and exercising frequently with a treadmill and exercise bike. She has been adherent to metformin 1000 mg BID without GI side effects. Believe she would do well with a GLP-1 agonist and she is interested in this. Will start ozempic .25mg  and up titrate to .5mg  if she tolerates well without side effects. Hopeful that if she continues to work on lifestyle modifications we would be able to titrate off of the metformin completely  Plan: - continue metformin 1000 mg BID - start ozempic .25 weekly, up titrate to .5 mg as able - foot exam unremarkable - A1c in 3 months  White coat syndrome with high blood pressure but without hypertension Assessment: BP at goal today not requiring anti-hypertensives.   Plan: - continue to monitor BP  Healthcare maintenance Provided prescription for first shingles  vaccination  Polyneuropathy Assessment: Extensive history of peripheral neuropathy located in her hands and feet since 2016. She described it as a burning pain. Initial work up at that time was negative for HIV, MMA, and RPR. Her A1c initially of 5.5% and over the last few years has developed T2DM. No further testing had been done as she was uninsured. The pain has improved with increasing gabapentin.  Etiologies include metabolic syndrome. Because these symptoms developed before her diagnosis of diabetes, do not suspect DM as the cause. With  unclear etiology would recommend EMG and neurology referral. Will call and discuss with Maureen Reyes in the coming days  Plan: - continue gabapentin 300 mg QHS - discuss referral to neurology  Mononeuropathy Assessment: Notes left lower extremity neuropathic pain for the past few weeks. She notes this is different from her polyneuropathy and sciatica in the past. The pain originates on the lateral dorsal aspect of her left foot and radiates up her leg and into her back. The pain occurs periodically, up to 2-3 times a week. She notices it mostly when she is walking down steps. Denies trauma or deformity to the area.   Her exam is unremarkable; no bony deformity or tenderness to palpation. No skin changes. 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 Other etiologies considered are tendonpopathy or nerve entrapment, but believe these would be more consistent pain. Will treat with NSAIDS and monitor. Can consider imaging if pain persists.   Plan: - 400 mg ibuprofen q6h for 4-5 days - see polyneuropathy assessment, will discuss referral to neurology.   Hypercholesteremia Assessment: LDL at goal for primary prevention  Plan: - continue rosuvastatin 20 mg daily  Patient discussed with Dr. Verlee Rossetti, D.O. [facility], PGY-3 Phone: [phone] Date 10/14/2022 Time 10:32 PM

## 2022-10-14 NOTE — Assessment & Plan Note (Addendum)
Assessment: Extensive history of peripheral neuropathy located in her hands and feet since 2016. She described it as a burning pain. Initial work up at that time was negative for HIV, MMA, and RPR. Her A1c initially of 5.5% and over the last few years has developed T2DM. No further testing had been done as she was uninsured. The pain has improved with increasing gabapentin.  Etiologies include metabolic syndrome. Because these symptoms developed before her diagnosis of diabetes, do not suspect DM as the cause. With unclear etiology would recommend EMG and neurology referral. Will call and discuss with Maureen Reyes in the coming days  Plan: - continue gabapentin 300 mg QHS - discuss referral to neurology

## 2022-10-14 NOTE — Assessment & Plan Note (Addendum)
Assessment: BP at goal today not requiring anti-hypertensives.   Plan: - continue to monitor BP

## 2022-10-14 NOTE — Assessment & Plan Note (Signed)
Assessment: Notes left lower extremity neuropathic pain for the past few weeks. She notes this is different from her polyneuropathy and sciatica in the past. The pain originates on the lateral dorsal aspect of her left foot and radiates up her leg and into her back. The pain occurs periodically, up to 2-3 times a week. She notices it mostly when she is walking down steps. Denies trauma or deformity to the area.   Her exam is unremarkable; no bony deformity or tenderness to palpation. No skin changes. 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 Other etiologies considered are tendonpopathy or nerve entrapment, but believe these would be more consistent pain. Will treat with NSAIDS and monitor. Can consider imaging if pain persists.   Plan: - 400 mg ibuprofen q6h for 4-5 days - see polyneuropathy assessment, will discuss referral to neurology.

## 2022-10-14 NOTE — Assessment & Plan Note (Signed)
Provided prescription for first shingles vaccination

## 2022-10-15 ENCOUNTER — Telehealth: Payer: Self-pay

## 2022-10-15 ENCOUNTER — Other Ambulatory Visit (HOSPITAL_COMMUNITY): Payer: Self-pay

## 2022-10-15 NOTE — Telephone Encounter (Signed)
Prior Authorization for patient (ozempic) came through on cover my meds was submitted with last office notes and labs awaiting approval or denial. 

## 2022-10-15 NOTE — Telephone Encounter (Signed)
Decision:Approved Wells Guiles (Key: [id number]) Ozempic (0.25 or 0.5 MG/DOSE) 2MG Fayne Mediate pen-injectors Form [employer] Electronic Prior Authorization Form (2017 NCPDP) Created Message from Plan Request Reference Number: [id number]. OZEMPIC INJ 2MG /3ML is approved through 10/15/2023. For further questions, call [employer] at [phone].Marland Kitchen Authorization Expiration Date: October 15, 2023.

## 2022-10-16 NOTE — Addendum Note (Signed)
Addended by: Riesa Pope on: 10/16/2022 08:31 AM   Modules accepted: Orders

## 2022-10-17 NOTE — Progress Notes (Signed)
Internal Medicine Clinic Attending  Case discussed with Dr. Katsadouros  At the time of the visit.  We reviewed the resident's history and exam and pertinent patient test results.  I agree with the assessment, diagnosis, and plan of care documented in the resident's note.  

## 2022-10-23 ENCOUNTER — Other Ambulatory Visit (INDEPENDENT_AMBULATORY_CARE_PROVIDER_SITE_OTHER): Payer: Medicaid Other

## 2022-10-23 DIAGNOSIS — G629 Polyneuropathy, unspecified: Secondary | ICD-10-CM

## 2022-10-24 LAB — CMP14 + ANION GAP
ALT: 20 IU/L (ref 0–32)
AST: 16 IU/L (ref 0–40)
Albumin/Globulin Ratio: 2 (ref 1.2–2.2)
Albumin: 4.7 g/dL (ref 3.8–4.9)
Alkaline Phosphatase: 86 IU/L (ref 44–121)
Anion Gap: 17 mmol/L (ref 10.0–18.0)
BUN/Creatinine Ratio: 8 — ABNORMAL LOW (ref 9–23)
BUN: 5 mg/dL — ABNORMAL LOW (ref 6–24)
Bilirubin Total: 0.8 mg/dL (ref 0.0–1.2)
CO2: 22 mmol/L (ref 20–29)
Calcium: 9.5 mg/dL (ref 8.7–10.2)
Chloride: 101 mmol/L (ref 96–106)
Creatinine, Ser: 0.65 mg/dL (ref 0.57–1.00)
Globulin, Total: 2.4 g/dL (ref 1.5–4.5)
Glucose: 115 mg/dL — ABNORMAL HIGH (ref 70–99)
Potassium: 4.2 mmol/L (ref 3.5–5.2)
Sodium: 140 mmol/L (ref 134–144)
Total Protein: 7.1 g/dL (ref 6.0–8.5)
eGFR: 105 mL/min/{1.73_m2} (ref >59)

## 2022-10-27 NOTE — Progress Notes (Signed)
Addendum: CMP benign and no evidence of protein gap or renal dysfunction to indicate any concern for acute multiple myeloma. Will await EMG studies and if benign can consider sending spep and IF studies.

## 2022-12-01 ENCOUNTER — Other Ambulatory Visit: Payer: Self-pay

## 2022-12-01 DIAGNOSIS — G6289 Other specified polyneuropathies: Secondary | ICD-10-CM

## 2022-12-03 ENCOUNTER — Other Ambulatory Visit (HOSPITAL_COMMUNITY): Payer: Self-pay

## 2022-12-05 ENCOUNTER — Other Ambulatory Visit (HOSPITAL_COMMUNITY): Payer: Self-pay

## 2022-12-05 MED ORDER — GABAPENTIN 100 MG PO CAPS
300.0000 mg | ORAL_CAPSULE | Freq: Every day | ORAL | 3 refills | Status: DC
  Filled 2022-12-05: qty 90, 30d supply, fill #0
  Filled 2023-01-07: qty 90, 30d supply, fill #1
  Filled 2023-02-27: qty 90, 30d supply, fill #2
  Filled 2023-04-07: qty 90, 30d supply, fill #3

## 2022-12-08 ENCOUNTER — Other Ambulatory Visit (HOSPITAL_COMMUNITY): Payer: Self-pay

## 2022-12-12 ENCOUNTER — Other Ambulatory Visit: Payer: Self-pay

## 2022-12-15 ENCOUNTER — Other Ambulatory Visit (HOSPITAL_COMMUNITY): Payer: Self-pay

## 2022-12-30 ENCOUNTER — Other Ambulatory Visit: Payer: Self-pay

## 2022-12-30 ENCOUNTER — Other Ambulatory Visit: Payer: Self-pay | Admitting: Student

## 2022-12-30 ENCOUNTER — Other Ambulatory Visit (HOSPITAL_COMMUNITY): Payer: Self-pay

## 2022-12-30 DIAGNOSIS — E119 Type 2 diabetes mellitus without complications: Secondary | ICD-10-CM

## 2022-12-30 MED ORDER — OZEMPIC (0.25 OR 0.5 MG/DOSE) 2 MG/3ML ~~LOC~~ SOPN
0.2500 mg | PEN_INJECTOR | SUBCUTANEOUS | 2 refills | Status: DC
  Filled 2022-12-30 – 2023-01-07 (×2): qty 3, 28d supply, fill #0
  Filled 2023-02-01: qty 3, 28d supply, fill #1
  Filled 2023-02-27: qty 3, 28d supply, fill #2

## 2022-12-31 ENCOUNTER — Other Ambulatory Visit: Payer: Self-pay

## 2022-12-31 ENCOUNTER — Other Ambulatory Visit (HOSPITAL_COMMUNITY): Payer: Self-pay

## 2022-12-31 MED ORDER — ACCU-CHEK GUIDE VI STRP
ORAL_STRIP | 12 refills | Status: DC
  Filled 2022-12-31 – 2023-01-15 (×2): qty 100, fill #0
  Filled 2023-01-16: qty 100, 90d supply, fill #0
  Filled 2023-03-26: qty 100, 90d supply, fill #1

## 2022-12-31 MED ORDER — ACCU-CHEK GUIDE W/DEVICE KIT
1.0000 | PACK | 3 refills | Status: AC | PRN
  Filled 2022-12-31: qty 1, 25d supply, fill #0
  Filled 2023-01-15: qty 1, 1d supply, fill #0
  Filled 2023-01-16: qty 1, 30d supply, fill #0

## 2022-12-31 MED ORDER — ACCU-CHEK SOFTCLIX LANCETS MISC
12 refills | Status: AC
  Filled 2022-12-31 – 2023-01-15 (×2): qty 100, 25d supply, fill #0
  Filled 2023-03-26: qty 100, 25d supply, fill #1

## 2023-01-01 ENCOUNTER — Other Ambulatory Visit (HOSPITAL_COMMUNITY): Payer: Self-pay

## 2023-01-07 ENCOUNTER — Other Ambulatory Visit (HOSPITAL_COMMUNITY): Payer: Self-pay

## 2023-01-07 ENCOUNTER — Other Ambulatory Visit: Payer: Self-pay

## 2023-01-15 ENCOUNTER — Ambulatory Visit: Payer: 59 | Admitting: Student

## 2023-01-15 ENCOUNTER — Encounter: Payer: Self-pay | Admitting: Student

## 2023-01-15 ENCOUNTER — Other Ambulatory Visit: Payer: Self-pay

## 2023-01-15 ENCOUNTER — Other Ambulatory Visit (HOSPITAL_COMMUNITY): Payer: Self-pay

## 2023-01-15 VITALS — BP 120/86 | HR 74 | Temp 97.7°F | Ht 69.0 in | Wt 236.0 lb

## 2023-01-15 DIAGNOSIS — E1165 Type 2 diabetes mellitus with hyperglycemia: Secondary | ICD-10-CM | POA: Diagnosis not present

## 2023-01-15 DIAGNOSIS — E119 Type 2 diabetes mellitus without complications: Secondary | ICD-10-CM | POA: Diagnosis not present

## 2023-01-15 DIAGNOSIS — Z7984 Long term (current) use of oral hypoglycemic drugs: Secondary | ICD-10-CM

## 2023-01-15 DIAGNOSIS — Z794 Long term (current) use of insulin: Secondary | ICD-10-CM

## 2023-01-15 DIAGNOSIS — Z0000 Encounter for general adult medical examination without abnormal findings: Secondary | ICD-10-CM

## 2023-01-15 LAB — POCT GLYCOSYLATED HEMOGLOBIN (HGB A1C): Hemoglobin A1C: 6.2 % — AB (ref 4.0–5.6)

## 2023-01-15 LAB — GLUCOSE, CAPILLARY: Glucose-Capillary: 149 mg/dL — ABNORMAL HIGH (ref 70–99)

## 2023-01-15 NOTE — Patient Instructions (Addendum)
Thank you so much for coming to the clinic today!   I'm glad you're doing so well! Keep up the great work with your diet and exercise! And keep taking all your medications!  If you have any questions please feel free to the call the clinic at anytime at [phone]. It was a pleasure seeing you!  Best, Dr. Thomasene Ripple

## 2023-01-16 ENCOUNTER — Other Ambulatory Visit (HOSPITAL_COMMUNITY): Payer: Self-pay

## 2023-01-16 ENCOUNTER — Other Ambulatory Visit: Payer: Self-pay

## 2023-01-16 LAB — HEPATITIS C ANTIBODY: Hep C Virus Ab: NONREACTIVE

## 2023-01-16 NOTE — Assessment & Plan Note (Signed)
Hep C Screening done today, within normal limits.

## 2023-01-16 NOTE — Progress Notes (Signed)
CC: Diabetes follow up  HPI:  [name] is a 52 y.o. female living with a history stated below and presents today for Diabetes follow up . Please see problem based assessment and plan for additional details.  Past Medical History:  Diagnosis Date   Arthritis    Chronic headaches    HLD (hyperlipidemia)    Hot flashes 02/29/2020   Lumbar facet arthropathy    Neuropathy    Prediabetes    Sciatica of right side    Sciatica of right side 05/04/2014    Current Outpatient Medications on File Prior to Visit  Medication Sig Dispense Refill   Accu-Chek Softclix Lancets lancets Use as instructed 100 each 12   Blood Glucose Monitoring Suppl (ACCU-CHEK GUIDE) w/Device KIT Use as directed 1 kit 3   gabapentin (NEURONTIN) 100 MG capsule Take 3 capsules (300 mg total) by mouth at bedtime. 90 capsule 3   glucose blood (ACCU-CHEK GUIDE) test strip Use as instructed 100 each 12   glucose blood (GNP TRUE METRIX GLUCOSE STRIPS) test strip Use as instructed to check blood sugar. 100 each 12   Insulin Pen Needle (PEN NEEDLES) 32G X 4 MM MISC Use weekly with ozempic 30 each 1   Insulin Pen Needle (TRUEPLUS 5-BEVEL PEN NEEDLES) 31G X 5 MM MISC 1 Box by Does not apply route as needed. 1 each 0   metFORMIN (GLUCOPHAGE) 1000 MG tablet Take 1 tablet (1,000 mg total) by mouth 2 (two) times daily with a meal. 120 tablet 5   rosuvastatin (CRESTOR) 20 MG tablet Take 1 tablet (20 mg total) by mouth daily. 90 tablet 2   Semaglutide,0.25 or 0.5MG /DOS, (OZEMPIC, 0.25 OR 0.5 MG/DOSE,) 2 MG/3ML SOPN Inject 0.25 mg into the skin once a week. Increase after 4 weeks if tolerating well to 0.5mg . 3 mL 2   No current facility-administered medications on file prior to visit.    Family History  Problem Relation Age of Onset   Diabetes Mother    Diabetes Mellitus II Mother    Thyroid disease Mother    Colon polyps Mother    Diabetes Father    Diabetes Mellitus II Father    Diabetes Maternal Grandmother     Diabetes Maternal Grandfather    Cancer Maternal Grandfather        type unknown   Diabetes Paternal Grandmother    Diabetes Paternal Grandfather    Diabetes Maternal Aunt        several aunts and uncles   Heart disease Maternal Aunt    Diabetes Maternal Uncle        several aunts and uncles   Diabetes Paternal Uncle        several aunts and uncles   Diabetes Paternal Aunt        several aunts and uncles    Social History   Socioeconomic History   Marital status: Single    Spouse name: Not on file   Number of children: 3   Years of education: Not on file   Highest education level: Some college, no degree  Occupational History   Not on file  Tobacco Use   Smoking status: Former    Packs/day: 0.25    Years: 20.00    Additional pack years: 0.00    Total pack years: 5.00    Types: Cigarettes    Quit date: 07/01/2020    Years since quitting: 2.5   Smokeless tobacco: Never   Tobacco comments:    Stopped  last year   Vaping Use   Vaping Use: Never used  Substance and Sexual Activity   Alcohol use: No   Drug use: No   Sexual activity: Not Currently    Birth control/protection: Surgical  Other Topics Concern   Not on file  Social History Narrative   Not on file   Social Determinants of Health   Financial Resource Strain: Not on file  Food Insecurity: No Food Insecurity (07/15/2022)   Hunger Vital Sign    Worried About Running Out of Food in the Last Year: Never true    Ran Out of Food in the Last Year: Never true  Transportation Needs: No Transportation Needs (07/15/2022)   PRAPARE - Administrator, Civil Service (Medical): No    Lack of Transportation (Non-Medical): No  Physical Activity: Not on file  Stress: Not on file  Social Connections: Moderately Integrated (07/15/2022)   Social Connection and Isolation Panel [NHANES]    Frequency of Communication with Friends and Family: More than three times a week    Frequency of Social Gatherings with  Friends and Family: More than three times a week    Attends Religious Services: More than 4 times per year    Active Member of [employer] or Organizations: No    Attends Banker Meetings: Never    Marital Status: Living with partner  Intimate Partner Violence: Not At Risk (07/15/2022)   Humiliation, Afraid, Rape, and Kick questionnaire    Fear of Current or Ex-Partner: No    Emotionally Abused: No    Physically Abused: No    Sexually Abused: No    Review of Systems: ROS negative except for what is noted on the assessment and plan.  Vitals:   01/15/23 1001  BP: 120/86  Pulse: 74  Temp: 97.7 F (36.5 C)  TempSrc: Oral  SpO2: 99%  Weight: 236 lb (107 kg)  Height: 5\' 9"  (1.753 m)    Physical Exam: Constitutional: well-appearing female  in no acute distress Cardiovascular: regular rate and rhythm, no m/r/g Pulmonary/Chest: normal work of breathing on room air, lungs clear to auscultation bilaterally Abdominal: soft, non-tender, non-distended Skin: warm and dry Psych: normal mood and affect  Assessment & Plan:   Uncontrolled type 2 diabetes mellitus with hyperglycemia (HCC) Pt presents for diabetes follow up. A1c currently at 6.2%, down from 6.8% in March. Current regimen is metformin 1000mg  BID, and Ozempic .5mg  weekly. She is tolerating both of these medications well with no side effects. She is incorporating exercise into her daily routine with walking her dogs daily. Review of CBG showed a low of 79, and high of 118. She has had no symptoms of hypoglycemia. Her diet is also under control, as she is making an active effort to eat better food. Will continue current regimen.  Foot exam unremarkable.   Plan:  - Continue metformin 1000mg  BID  - Continue Ozempic .5mg  weekly  - Consider titrating metformin at next visit  Healthcare maintenance Hep C Screening done today, within normal limits.   Patient discussed with Dr. Lanelle Bal Maribel Hadley, M.D. [facility]  Internal Medicine, PGY-1 Pager: [phone] Date 01/16/2023 Time 2:53 PM

## 2023-01-16 NOTE — Assessment & Plan Note (Addendum)
Pt presents for diabetes follow up. A1c currently at 6.2%, down from 6.8% in March. Current regimen is metformin 1000mg  BID, and Ozempic .5mg  weekly. She is tolerating both of these medications well with no side effects. She is incorporating exercise into her daily routine with walking her dogs daily. Review of CBG showed a low of 79, and high of 118. She has had no symptoms of hypoglycemia. Her diet is also under control, as she is making an active effort to eat better food. Will continue current regimen.  Foot exam unremarkable.   Plan:  - Continue metformin 1000mg  BID  - Continue Ozempic .5mg  weekly  - Consider titrating metformin at next visit

## 2023-01-20 NOTE — Progress Notes (Signed)
Internal Medicine Clinic Attending  Case discussed with Dr. Nooruddin  at the time of the visit.  We reviewed the resident's history and exam and pertinent patient test results.  I agree with the assessment, diagnosis, and plan of care documented in the resident's note.  

## 2023-02-02 ENCOUNTER — Encounter: Payer: Self-pay | Admitting: Diagnostic Neuroimaging

## 2023-02-02 ENCOUNTER — Ambulatory Visit (INDEPENDENT_AMBULATORY_CARE_PROVIDER_SITE_OTHER): Payer: 59 | Admitting: Diagnostic Neuroimaging

## 2023-02-02 VITALS — BP 118/79 | HR 62 | Ht 68.0 in | Wt 236.0 lb

## 2023-02-02 DIAGNOSIS — M5416 Radiculopathy, lumbar region: Secondary | ICD-10-CM

## 2023-02-02 NOTE — Patient Instructions (Signed)
  LEFT FOOT BURNING SENSATION / NUMBNESS (left low back pain; radiating symptoms; since ~2010; progressively worsening; likely lumbar radiculopathy) - check MRI lumbar spine (rule out spinal stenosis; neurogenic claudication; home exercises and NSAIDS > 6 weeks without benefit)

## 2023-02-02 NOTE — Progress Notes (Signed)
[facility]  PATIENT: TERRENCIA Reyes DOB: [birth year]  REFERRING CLINICIAN: Mercie Eon, MD HISTORY FROM: patient  REASON FOR VISIT: new consult   HISTORICAL  CHIEF COMPLAINT:  Chief Complaint  Patient presents with   New Patient (Initial Visit)    Rm 6, here alone  Pt is here for neuropathy. States feels a burning sensation in feet and she feels tingling on her hands, states has been going on and off for a couple of yrs.  States at times she gets a sharp shooting pain from her back down her leg on the left side.     HISTORY OF PRESENT ILLNESS:   52 year old female here for evaluation of left foot numbness and tingling.  History of low back pain rating to left leg since approximately 2010.  Symptoms have fluctuated but progressed over time.  She tried to manage conservatively with over-the-counter medications and home exercises.  Symptoms have worsened in the last 1 to 2 years.  Now having burning shooting electrical pain in the top of her left foot.  Has some issues with low back pain rating to the left leg.  Symptoms worse with certain positions bending, stretching, getting in and out of the car, standing or walking for a long time. No issues with hands or right leg.   REVIEW OF SYSTEMS: Full 14 system review of systems performed and negative with exception of: as per HPI.  ALLERGIES: Allergies  Allergen Reactions   Hydrocodone Nausea Only    HOME MEDICATIONS: Outpatient Medications Prior to Visit  Medication Sig Dispense Refill   Accu-Chek Softclix Lancets lancets Use as instructed 100 each 12   Blood Glucose Monitoring Suppl (ACCU-CHEK GUIDE) w/Device KIT Use as directed 1 kit 3   gabapentin (NEURONTIN) 100 MG capsule Take 3 capsules (300 mg total) by mouth at bedtime. 90 capsule 3   glucose blood (ACCU-CHEK GUIDE) test strip Use as instructed 100 each 12   glucose blood (GNP TRUE METRIX GLUCOSE STRIPS) test strip Use as instructed to check blood  sugar. 100 each 12   Insulin Pen Needle (TRUEPLUS 5-BEVEL PEN NEEDLES) 31G X 5 MM MISC 1 Box by Does not apply route as needed. 1 each 0   metFORMIN (GLUCOPHAGE) 1000 MG tablet Take 1 tablet (1,000 mg total) by mouth 2 (two) times daily with a meal. 120 tablet 5   rosuvastatin (CRESTOR) 20 MG tablet Take 1 tablet (20 mg total) by mouth daily. 90 tablet 2   Semaglutide,0.25 or 0.5MG /DOS, (OZEMPIC, 0.25 OR 0.5 MG/DOSE,) 2 MG/3ML SOPN Inject 0.25 mg into the skin once a week. Increase after 4 weeks if tolerating well to 0.5mg . 3 mL 2   Insulin Pen Needle (PEN NEEDLES) 32G X 4 MM MISC Use weekly with ozempic (Patient not taking: Reported on 02/02/2023) 30 each 1   No facility-administered medications prior to visit.    PAST MEDICAL HISTORY: Past Medical History:  Diagnosis Date   Arthritis    Chronic headaches    HLD (hyperlipidemia)    Hot flashes 02/29/2020   Lumbar facet arthropathy    Neuropathy    Prediabetes    Sciatica of right side    Sciatica of right side 05/04/2014    PAST SURGICAL HISTORY: Past Surgical History:  Procedure Laterality Date   TUBAL LIGATION  1990 pt reported    FAMILY HISTORY: Family History  Problem Relation Age of Onset   Diabetes Mother    Diabetes Mellitus II Mother    Thyroid  disease Mother    Colon polyps Mother    Diabetes Father    Diabetes Mellitus II Father    Diabetes Maternal Grandmother    Diabetes Maternal Grandfather    Cancer Maternal Grandfather        type unknown   Diabetes Paternal Grandmother    Diabetes Paternal Grandfather    Diabetes Maternal Aunt        several aunts and uncles   Heart disease Maternal Aunt    Diabetes Maternal Uncle        several aunts and uncles   Diabetes Paternal Uncle        several aunts and uncles   Diabetes Paternal Aunt        several aunts and uncles    SOCIAL HISTORY: Social History   Socioeconomic History   Marital status: Single    Spouse name: Not on file   Number of children: 3    Years of education: Not on file   Highest education level: Some college, no degree  Occupational History   Not on file  Tobacco Use   Smoking status: Former    Packs/day: 0.25    Years: 20.00    Additional pack years: 0.00    Total pack years: 5.00    Types: Cigarettes    Quit date: 07/01/2020    Years since quitting: 2.5   Smokeless tobacco: Never   Tobacco comments:    Stopped last year   Vaping Use   Vaping Use: Never used  Substance and Sexual Activity   Alcohol use: No   Drug use: No   Sexual activity: Not Currently    Birth control/protection: Surgical  Other Topics Concern   Not on file  Social History Narrative   Not on file   Social Determinants of Health   Financial Resource Strain: Not on file  Food Insecurity: No Food Insecurity (07/15/2022)   Hunger Vital Sign    Worried About Running Out of Food in the Last Year: Never true    Ran Out of Food in the Last Year: Never true  Transportation Needs: No Transportation Needs (07/15/2022)   PRAPARE - Administrator, Civil Service (Medical): No    Lack of Transportation (Non-Medical): No  Physical Activity: Not on file  Stress: Not on file  Social Connections: Moderately Integrated (07/15/2022)   Social Connection and Isolation Panel [NHANES]    Frequency of Communication with Friends and Family: More than three times a week    Frequency of Social Gatherings with Friends and Family: More than three times a week    Attends Religious Services: More than 4 times per year    Active Member of [employer] or Organizations: No    Attends Banker Meetings: Never    Marital Status: Living with partner  Intimate Partner Violence: Not At Risk (07/15/2022)   Humiliation, Afraid, Rape, and Kick questionnaire    Fear of Current or Ex-Partner: No    Emotionally Abused: No    Physically Abused: No    Sexually Abused: No     PHYSICAL EXAM  GENERAL EXAM/CONSTITUTIONAL: Vitals:  Vitals:   02/02/23  1156  BP: 118/79  Pulse: 62  Weight: 236 lb (107 kg)  Height: 5\' 8"  (1.727 m)   Body mass index is 35.88 kg/m. Wt Readings from Last 3 Encounters:  02/02/23 236 lb (107 kg)  01/15/23 236 lb (107 kg)  10/14/22 258 lb 1.6 oz (117.1 kg)   Patient  is in no distress; well developed, nourished and groomed; neck is supple  CARDIOVASCULAR: Examination of carotid arteries is normal; no carotid bruits Regular rate and rhythm, no murmurs Examination of peripheral vascular system by observation and palpation is normal  EYES: Ophthalmoscopic exam of optic discs and posterior segments is normal; no papilledema or hemorrhages No results found.  MUSCULOSKELETAL: Gait, strength, tone, movements noted in Neurologic exam below  NEUROLOGIC: MENTAL STATUS:      No data to display         awake, alert, oriented to person, place and time recent and remote memory intact normal attention and concentration language fluent, comprehension intact, naming intact fund of knowledge appropriate  CRANIAL NERVE:  2nd - no papilledema on fundoscopic exam 2nd, 3rd, 4th, 6th - pupils equal and reactive to light, visual fields full to confrontation, extraocular muscles intact, no nystagmus 5th - facial sensation symmetric 7th - facial strength symmetric 8th - hearing intact 9th - palate elevates symmetrically, uvula midline 11th - shoulder shrug symmetric 12th - tongue protrusion midline  MOTOR:  normal bulk and tone, full strength in the BUE, BLE  SENSORY:  normal and symmetric to light touch, pinprick, temperature, vibration  COORDINATION:  finger-nose-finger, fine finger movements normal  REFLEXES:  deep tendon reflexes 1+ and symmetric  GAIT/STATION:  narrow based gait; romberg is negative     DIAGNOSTIC DATA (LABS, IMAGING, TESTING) - I reviewed patient records, labs, notes, testing and imaging myself where available.  Lab Results  Component Value Date   WBC 9.5 05/03/2014    HGB 12.3 05/03/2014   HCT 37.0 05/03/2014   MCV 91.4 05/03/2014   PLT 385 05/03/2014      Component Value Date/Time   NA 140 10/23/2022 1129   K 4.2 10/23/2022 1129   CL 101 10/23/2022 1129   CO2 22 10/23/2022 1129   GLUCOSE 115 (H) 10/23/2022 1129   GLUCOSE 102 (H) 05/03/2014 1205   BUN 5 (L) 10/23/2022 1129   CREATININE 0.65 10/23/2022 1129   CREATININE 0.69 05/03/2014 1205   CALCIUM 9.5 10/23/2022 1129   PROT 7.1 10/23/2022 1129   ALBUMIN 4.7 10/23/2022 1129   AST 16 10/23/2022 1129   ALT 20 10/23/2022 1129   ALKPHOS 86 10/23/2022 1129   BILITOT 0.8 10/23/2022 1129   GFRNONAA 87 04/26/2018 1457   GFRNONAA >89 05/03/2014 1205   GFRAA 100 04/26/2018 1457   GFRAA >89 05/03/2014 1205   Lab Results  Component Value Date   CHOL 120 04/01/2022   HDL 41 04/01/2022   LDLCALC 59 04/01/2022   TRIG 109 04/01/2022   CHOLHDL 2.9 04/01/2022   Lab Results  Component Value Date   HGBA1C 6.2 (A) 01/15/2023   Lab Results  Component Value Date   VITAMINB12 409 02/18/2017   Lab Results  Component Value Date   TSH 5.040 (H) 02/28/2020    06/28/11 xray lumbar spine - No acute finding.  Lower lumbar facet arthropathy.    ASSESSMENT AND PLAN  52 y.o. year old female here with:   Dx:  1. Lumbar radiculopathy     PLAN:  LEFT FOOT BURNING SENSATION / NUMBNESS (left low back pain; radiating symptoms; since ~2010; progressively worsening; likely lumbar radiculopathy) - check MRI lumbar spine (rule out spinal stenosis; neurogenic claudication; home exercises and NSAIDS > 6 weeks without benefit)  Orders Placed This Encounter  Procedures   MR LUMBAR SPINE WO CONTRAST   Return for pending if symptoms worsen or fail to improve,  pending test results.    Suanne Marker, MD 02/02/2023, 12:37 PM Certified in Neurology, Neurophysiology and Neuroimaging  [facility] [address] [place], [place] [address] [phone]

## 2023-02-05 ENCOUNTER — Encounter: Payer: Self-pay | Admitting: Internal Medicine

## 2023-02-11 ENCOUNTER — Telehealth: Payer: Self-pay | Admitting: Diagnostic Neuroimaging

## 2023-02-11 NOTE — Telephone Encounter (Signed)
[employer] medicare [employer], [employer] medicaid Berkley Harvey: [id number] exp. 02/11/23-03/28/23 sent to GI [phone]

## 2023-02-20 ENCOUNTER — Ambulatory Visit
Admission: RE | Admit: 2023-02-20 | Discharge: 2023-02-20 | Disposition: A | Discharge status: Home / Self Care | Payer: 59 | Source: Ambulatory Visit | Attending: Diagnostic Neuroimaging | Admitting: Diagnostic Neuroimaging

## 2023-02-20 DIAGNOSIS — M5416 Radiculopathy, lumbar region: Secondary | ICD-10-CM

## 2023-02-23 ENCOUNTER — Encounter: Payer: Self-pay | Admitting: *Deleted

## 2023-02-25 ENCOUNTER — Ambulatory Visit (AMBULATORY_SURGERY_CENTER): Payer: 59

## 2023-02-25 VITALS — Ht 68.0 in | Wt 236.0 lb

## 2023-02-25 DIAGNOSIS — Z86010 Personal history of colonic polyps: Secondary | ICD-10-CM

## 2023-02-25 NOTE — Progress Notes (Signed)
 No egg or soy allergy known to patient  No issues known to pt with past sedation with any surgeries or procedures Patient denies ever being told they had issues or difficulty with intubation  No FH of Malignant Hyperthermia Pt is not on diet pills Pt is not on  home 02  Pt is not on blood thinners  Pt denies issues with constipation  No A fib or A flutter Have any cardiac testing pending--no  LOA: independent  Prep: Miralax   Patient's chart reviewed by Cathlyn Parsons CNRA prior to previsit and patient appropriate for the [facility].  Previsit completed and red dot placed by patient's name on their procedure day (on provider's schedule).     PV competed with patient. Prep instructions sent via mychart and home address.

## 2023-02-27 ENCOUNTER — Other Ambulatory Visit: Payer: Self-pay | Admitting: Student

## 2023-02-27 ENCOUNTER — Other Ambulatory Visit: Payer: Self-pay

## 2023-02-27 DIAGNOSIS — E782 Mixed hyperlipidemia: Secondary | ICD-10-CM

## 2023-03-02 ENCOUNTER — Other Ambulatory Visit (HOSPITAL_COMMUNITY): Payer: Self-pay

## 2023-03-02 MED ORDER — ROSUVASTATIN CALCIUM 20 MG PO TABS
20.0000 mg | ORAL_TABLET | Freq: Every day | ORAL | 2 refills | Status: DC
Start: 2023-03-02 — End: 2023-11-25
  Filled 2023-03-02: qty 90, 90d supply, fill #0
  Filled 2023-05-26: qty 90, 90d supply, fill #1
  Filled 2023-06-30 – 2023-08-27 (×2): qty 90, 90d supply, fill #2

## 2023-03-02 NOTE — Telephone Encounter (Signed)
Dr.Sater I am only sending this back to you because informed pt of results. Pt states she was not prescribed an anti-inflammatory, per note "She tried to manage conservatively with over-the-counter medications and home exercises."    Please advise

## 2023-03-24 ENCOUNTER — Other Ambulatory Visit: Payer: Self-pay

## 2023-03-25 ENCOUNTER — Ambulatory Visit (AMBULATORY_SURGERY_CENTER): Payer: 59 | Admitting: Internal Medicine

## 2023-03-25 ENCOUNTER — Encounter: Payer: Self-pay | Admitting: Internal Medicine

## 2023-03-25 VITALS — BP 116/80 | HR 64 | Temp 98.2°F | Resp 10 | Ht 68.0 in | Wt 236.0 lb

## 2023-03-25 DIAGNOSIS — Z09 Encounter for follow-up examination after completed treatment for conditions other than malignant neoplasm: Secondary | ICD-10-CM

## 2023-03-25 DIAGNOSIS — K621 Rectal polyp: Secondary | ICD-10-CM | POA: Diagnosis not present

## 2023-03-25 DIAGNOSIS — K635 Polyp of colon: Secondary | ICD-10-CM | POA: Diagnosis not present

## 2023-03-25 DIAGNOSIS — D123 Benign neoplasm of transverse colon: Secondary | ICD-10-CM

## 2023-03-25 DIAGNOSIS — D120 Benign neoplasm of cecum: Secondary | ICD-10-CM

## 2023-03-25 DIAGNOSIS — Z86010 Personal history of colonic polyps: Secondary | ICD-10-CM

## 2023-03-25 DIAGNOSIS — D128 Benign neoplasm of rectum: Secondary | ICD-10-CM

## 2023-03-25 MED ORDER — SODIUM CHLORIDE 0.9 % IV SOLN: 500.0000 mL | Freq: Once | INTRAVENOUS | Status: DC

## 2023-03-25 NOTE — Progress Notes (Signed)
Sedate, gd SR, tolerated procedure well, VSS, report to RN 

## 2023-03-25 NOTE — Progress Notes (Signed)
Pt's states no medical or surgical changes since previsit or office visit. 

## 2023-03-25 NOTE — Op Note (Addendum)
[facility] Patient Name: Maureen Reyes Procedure Date: 03/25/2023 10:25 AM MRN: [id number] Endoscopist: Madelyn Brunner Golva , , [id number] Age: 52 Referring MD:  Date of Birth: October 11, 1970 Gender: Female Account #: [id number] Procedure:                Colonoscopy Indications:              High risk colon cancer surveillance: Personal                            history of colonic polyps Medicines:                Monitored Anesthesia Care Procedure:                Pre-Anesthesia Assessment:                           - Prior to the procedure, a History and Physical                            was performed, and patient medications and                            allergies were reviewed. The patient's tolerance of                            previous anesthesia was also reviewed. The risks                            and benefits of the procedure and the sedation                            options and risks were discussed with the patient.                            All questions were answered, and informed consent                            was obtained. Prior Anticoagulants: The patient has                            taken no anticoagulant or antiplatelet agents. ASA                            Grade Assessment: II - A patient with mild systemic                            disease. After reviewing the risks and benefits,                            the patient was deemed in satisfactory condition to                            undergo the procedure.  After obtaining informed consent, the colonoscope                            was passed under direct vision. Throughout the                            procedure, the patient's blood pressure, pulse, and                            oxygen saturations were monitored continuously. The                            Olympus Scope SN: [id number] was introduced through                            the anus and advanced to the  the terminal ileum.                            The colonoscopy was performed without difficulty.                            The patient tolerated the procedure well. The                            quality of the bowel preparation was good. The                            terminal ileum, ileocecal valve, appendiceal                            orifice, and rectum were photographed. Scope In: 10:31:48 AM Scope Out: 10:49:16 AM Scope Withdrawal Time: 0 hours 14 minutes 33 seconds  Total Procedure Duration: 0 hours 17 minutes 28 seconds  Findings:                 The terminal ileum appeared normal.                           A tattoo was seen in the ascending colon. The                            tattoo site appeared normal.                           Two sessile polyps were found in the transverse                            colon and cecum. The polyps were 3 to 5 mm in size.                            These polyps were removed with a cold snare.                            Resection and retrieval were complete.  A 10 mm polyp was found in the rectum. The polyp                            was sessile. The polyp was removed with a cold                            snare. Resection and retrieval were complete.                           A few diverticula were found in the sigmoid colon,                            descending colon, transverse colon and cecum.                           Non-bleeding internal hemorrhoids were found during                            retroflexion. Complications:            No immediate complications. Estimated Blood Loss:     Estimated blood loss was minimal. Impression:               - The examined portion of the ileum was normal.                           - A tattoo was seen in the ascending colon. The                            tattoo site appeared normal.                           - Two 3 to 5 mm polyps in the transverse colon and                             in the cecum, removed with a cold snare. Resected                            and retrieved.                           - One 10 mm polyp in the rectum, removed with a                            cold snare. Resected and retrieved.                           - Diverticulosis in the sigmoid colon, in the                            descending colon, in the transverse colon and in                            the cecum.                           -  Non-bleeding internal hemorrhoids. Recommendation:           - Discharge patient to home (with escort).                           - Await pathology results.                           - Repeat colonoscopy in 3 years for surveillance.                           - Referral to genetics for genetic testing in the                            setting of large number of colon polyps on her                            first colonoscopy.                           - The findings and recommendations were discussed                            with the patient. Dr Particia Lather "[place]" [place],  03/25/2023 10:56:35 AM

## 2023-03-25 NOTE — Progress Notes (Signed)
Called to room to assist during endoscopic procedure.  Patient ID and intended procedure confirmed with present staff. Received instructions for my participation in the procedure from the performing physician.  

## 2023-03-25 NOTE — Patient Instructions (Signed)
Please read handouts provided. Await pathology results. Repeat colonoscopy in 3 years.   YOU HAD AN ENDOSCOPIC PROCEDURE TODAY AT THE [facility]:   Refer to the procedure report that was given to you for any specific questions about what was found during the examination.  If the procedure report does not answer your questions, please call your gastroenterologist to clarify.  If you requested that your care partner not be given the details of your procedure findings, then the procedure report has been included in a sealed envelope for you to review at your convenience later.  YOU SHOULD EXPECT: Some feelings of bloating in the abdomen. Passage of more gas than usual.  Walking can help get rid of the air that was put into your GI tract during the procedure and reduce the bloating. If you had a lower endoscopy (such as a colonoscopy or flexible sigmoidoscopy) you may notice spotting of blood in your stool or on the toilet paper. If you underwent a bowel prep for your procedure, you may not have a normal bowel movement for a few days.  Please Note:  You might notice some irritation and congestion in your nose or some drainage.  This is from the oxygen used during your procedure.  There is no need for concern and it should clear up in a day or so.  SYMPTOMS TO REPORT IMMEDIATELY:  Following lower endoscopy (colonoscopy or flexible sigmoidoscopy):  Excessive amounts of blood in the stool  Significant tenderness or worsening of abdominal pains  Swelling of the abdomen that is new, acute  Fever of 100F or higher  For urgent or emergent issues, a gastroenterologist can be reached at any hour by calling [phone]. Do not use MyChart messaging for urgent concerns.    DIET:  We do recommend a small meal at first, but then you may proceed to your regular diet.  Drink plenty of fluids but you should avoid alcoholic beverages for 24 hours.  ACTIVITY:  You should plan to take it easy for  the rest of today and you should NOT DRIVE or use heavy machinery until tomorrow (because of the sedation medicines used during the test).    FOLLOW UP: Our staff will call the number listed on your records the next business day following your procedure.  We will call around 7:15- 8:00 am to check on you and address any questions or concerns that you may have regarding the information given to you following your procedure. If we do not reach you, we will leave a message.     If any biopsies were taken you will be contacted by phone or by letter within the next 1-3 weeks.  Please call us at [phone] if you have not heard about the biopsies in 3 weeks.    SIGNATURES/CONFIDENTIALITY: You and/or your care partner have signed paperwork which will be entered into your electronic medical record.  These signatures attest to the fact that that the information above on your After Visit Summary has been reviewed and is understood.  Full responsibility of the confidentiality of this discharge information lies with you and/or your care-partner.

## 2023-03-25 NOTE — Progress Notes (Signed)
GASTROENTEROLOGY PROCEDURE H&P NOTE   Primary Care Physician: Olegario Messier, MD    Reason for Procedure:   History of colon polyps  Plan:    Colonoscopy  Patient is appropriate for endoscopic procedure(s) in the ambulatory ([facility]) setting.  The nature of the procedure, as well as the risks, benefits, and alternatives were carefully and thoroughly reviewed with the patient. Ample time for discussion and questions allowed. The patient understood, was satisfied, and agreed to proceed.     HPI: Maureen Reyes is a 52 y.o. female who presents for colonoscopy for evaluation of history of colon polyps .  Patient's last colonoscopy in 01/29/22 showed 15 polyps (10 of which were tubular adenomas).  Past Medical History:  Diagnosis Date   Arthritis    Chronic headaches    Diabetes mellitus without complication (HCC)    HLD (hyperlipidemia)    Hot flashes 02/29/2020   Lumbar facet arthropathy    Neuropathy    Prediabetes    Sciatica of right side    Sciatica of right side 05/04/2014    Past Surgical History:  Procedure Laterality Date   COLONOSCOPY  2023   Reana Chacko   TUBAL LIGATION  1990 pt reported    Prior to Admission medications   Medication Sig Start Date End Date Taking? Authorizing Provider  Accu-Chek Softclix Lancets lancets Use as instructed 12/31/22  Yes Nooruddin, Jason Fila, MD  Blood Glucose Monitoring Suppl (ACCU-CHEK GUIDE) w/Device KIT Use as directed 12/31/22  Yes Nooruddin, Jason Fila, MD  gabapentin (NEURONTIN) 100 MG capsule Take 3 capsules (300 mg total) by mouth at bedtime. 12/05/22  Yes Nooruddin, Jason Fila, MD  glucose blood (ACCU-CHEK GUIDE) test strip Use as instructed 12/31/22  Yes Nooruddin, Jason Fila, MD  glucose blood (GNP TRUE METRIX GLUCOSE STRIPS) test strip Use as instructed to check blood sugar. 07/18/22  Yes Adron Bene, MD  Insulin Pen Needle (TRUEPLUS 5-BEVEL PEN NEEDLES) 31G X 5 MM MISC 1 Box by Does not apply route as needed. 07/17/22 07/17/23 Yes Adron Bene, MD  rosuvastatin (CRESTOR) 20 MG tablet Take 1 tablet (20 mg total) by mouth daily. 03/02/23  Yes Nooruddin, Jason Fila, MD  metFORMIN (GLUCOPHAGE) 1000 MG tablet Take 1 tablet (1,000 mg total) by mouth 2 (two) times daily with a meal. 07/18/22   Adron Bene, MD  Semaglutide,0.25 or 0.5MG /DOS, (OZEMPIC, 0.25 OR 0.5 MG/DOSE,) 2 MG/3ML SOPN Inject 0.25 mg into the skin once a week. Increase after 4 weeks if tolerating well to 0.5mg . 12/30/22   Nooruddin, Jason Fila, MD    Current Outpatient Medications  Medication Sig Dispense Refill   Accu-Chek Softclix Lancets lancets Use as instructed 100 each 12   Blood Glucose Monitoring Suppl (ACCU-CHEK GUIDE) w/Device KIT Use as directed 1 kit 3   gabapentin (NEURONTIN) 100 MG capsule Take 3 capsules (300 mg total) by mouth at bedtime. 90 capsule 3   glucose blood (ACCU-CHEK GUIDE) test strip Use as instructed 100 each 12   glucose blood (GNP TRUE METRIX GLUCOSE STRIPS) test strip Use as instructed to check blood sugar. 100 each 12   Insulin Pen Needle (TRUEPLUS 5-BEVEL PEN NEEDLES) 31G X 5 MM MISC 1 Box by Does not apply route as needed. 1 each 0   rosuvastatin (CRESTOR) 20 MG tablet Take 1 tablet (20 mg total) by mouth daily. 90 tablet 2   metFORMIN (GLUCOPHAGE) 1000 MG tablet Take 1 tablet (1,000 mg total) by mouth 2 (two) times daily with a meal. 120 tablet 5   Semaglutide,0.25  or 0.5MG /DOS, (OZEMPIC, 0.25 OR 0.5 MG/DOSE,) 2 MG/3ML SOPN Inject 0.25 mg into the skin once a week. Increase after 4 weeks if tolerating well to 0.5mg . 3 mL 2   Current Facility-Administered Medications  Medication Dose Route Frequency Provider Last Rate Last Admin   0.9 %  sodium chloride infusion  500 mL Intravenous Once Imogene Burn, MD        Allergies as of 03/25/2023 - Review Complete 03/25/2023  Allergen Reaction Noted   Hydrocodone Nausea Only 05/28/2021    Family History  Problem Relation Age of Onset   Diabetes Mother    Diabetes Mellitus II Mother     Thyroid disease Mother    Colon polyps Mother    Diabetes Father    Diabetes Mellitus II Father    Diabetes Maternal Aunt        several aunts and uncles   Heart disease Maternal Aunt    Diabetes Maternal Uncle        several aunts and uncles   Diabetes Paternal Aunt        several aunts and uncles   Diabetes Paternal Uncle        several aunts and uncles   Diabetes Maternal Grandmother    Diabetes Maternal Grandfather    Cancer Maternal Grandfather        type unknown   Diabetes Paternal Grandmother    Diabetes Paternal Grandfather    Colon cancer Neg Hx    Rectal cancer Neg Hx    Stomach cancer Neg Hx    Esophageal cancer Neg Hx     Social History   Socioeconomic History   Marital status: Single    Spouse name: Not on file   Number of children: 3   Years of education: Not on file   Highest education level: Some college, no degree  Occupational History   Not on file  Tobacco Use   Smoking status: Former    Current packs/day: 0.00    Average packs/day: 0.3 packs/day for 20.0 years (5.0 ttl pk-yrs)    Types: Cigarettes    Start date: 07/01/2000    Quit date: 07/01/2020    Years since quitting: 2.7   Smokeless tobacco: Never   Tobacco comments:    Stopped last year   Vaping Use   Vaping status: Never Used  Substance and Sexual Activity   Alcohol use: No   Drug use: No   Sexual activity: Not Currently    Birth control/protection: Surgical  Other Topics Concern   Not on file  Social History Narrative   Not on file   Social Determinants of Health   Financial Resource Strain: Not on file  Food Insecurity: No Food Insecurity (07/15/2022)   Hunger Vital Sign    Worried About Running Out of Food in the Last Year: Never true    Ran Out of Food in the Last Year: Never true  Transportation Needs: No Transportation Needs (07/15/2022)   PRAPARE - Administrator, Civil Service (Medical): No    Lack of Transportation (Non-Medical): No  Physical Activity:  Not on file  Stress: Not on file  Social Connections: Moderately Integrated (07/15/2022)   Social Connection and Isolation Panel [NHANES]    Frequency of Communication with Friends and Family: More than three times a week    Frequency of Social Gatherings with Friends and Family: More than three times a week    Attends Religious Services: More than 4 times per year  Active Member of Clubs or Organizations: No    Attends Banker Meetings: Never    Marital Status: Living with partner  Intimate Partner Violence: Not At Risk (07/15/2022)   Humiliation, Afraid, Rape, and Kick questionnaire    Fear of Current or Ex-Partner: No    Emotionally Abused: No    Physically Abused: No    Sexually Abused: No    Physical Exam: Vital signs in last 24 hours: BP 112/67   Pulse 66   Temp 98.2 F (36.8 C)   Ht 5\' 8"  (1.727 m)   Wt 236 lb (107 kg)   SpO2 98%   BMI 35.88 kg/m  GEN: NAD EYE: Sclerae anicteric ENT: MMM CV: Non-tachycardic Pulm: No increased WOB GI: Soft NEURO:  Alert & Oriented   Eulah Pont, MD [facility]   03/25/2023 10:20 AM

## 2023-03-26 ENCOUNTER — Telehealth: Payer: Self-pay | Admitting: *Deleted

## 2023-03-26 ENCOUNTER — Other Ambulatory Visit (HOSPITAL_COMMUNITY): Payer: Self-pay

## 2023-03-26 NOTE — Telephone Encounter (Signed)
  Follow up Call-     03/25/2023   10:00 AM 01/29/2022    8:26 AM  Call back number  Post procedure Call Back phone  # [phone]  Permission to leave phone message Yes Yes     Patient questions:  Do you have a fever, pain , or abdominal swelling? No. Pain Score  0 *  Have you tolerated food without any problems? Yes.    Have you been able to return to your normal activities? Yes.    Do you have any questions about your discharge instructions: Diet   No. Medications  No. Follow up visit  No.  Do you have questions or concerns about your Care? No.  Actions: * If pain score is 4 or above: No action needed, pain <4.

## 2023-03-27 ENCOUNTER — Telehealth: Payer: Self-pay | Admitting: Genetic Counselor

## 2023-04-01 ENCOUNTER — Encounter: Payer: Self-pay | Admitting: Internal Medicine

## 2023-04-01 ENCOUNTER — Ambulatory Visit (INDEPENDENT_AMBULATORY_CARE_PROVIDER_SITE_OTHER): Payer: 59

## 2023-04-01 VITALS — Ht 69.0 in | Wt 236.0 lb

## 2023-04-01 DIAGNOSIS — Z0000 Encounter for general adult medical examination without abnormal findings: Secondary | ICD-10-CM | POA: Diagnosis not present

## 2023-04-01 NOTE — Patient Instructions (Signed)
Maureen Reyes , Thank you for taking time to come for your Medicare Wellness Visit. I appreciate your ongoing commitment to your health goals. Please review the following plan we discussed and let me know if I can assist you in the future.   Referrals/Orders/Follow-Ups/Clinician Recommendations: You are due for a Pneumonia vaccine, which you can get this done at your pharmacy or during a office visit with your PCP.  You are also due for a foot examination and some lab work.  Remember to call the office to schedule a 6 month follow up visit with your PCP and also find out about your insurance.  It was nice to talk with you today and Happy Birthday to you.  Each day, aim for 6 glasses of water, plenty of protein in your diet and try to get up and walk/ stretch every hour for 5-10 minutes at a time.    This is a list of the screening recommended for you and due dates:  Health Maintenance  Topic Date Due   Yearly kidney health urinalysis for diabetes  11/27/2022   Complete foot exam   11/27/2022   Flu Shot  02/26/2023   COVID-19 Vaccine (4 - 2023-24 season) 03/29/2023   Eye exam for diabetics  05/06/2023   Hemoglobin A1C  07/17/2023   Yearly kidney function blood test for diabetes  10/23/2023   Colon Cancer Screening  03/24/2024   Medicare Annual Wellness Visit  03/31/2024   Mammogram  06/03/2024   Pap Smear  04/17/2025   DTaP/Tdap/Td vaccine (2 - Td or Tdap) 12/10/2027   Hepatitis C Screening  Completed   HIV Screening  Completed   Zoster (Shingles) Vaccine  Completed   HPV Vaccine  Aged Out    Advanced directives: (Copy Requested) Please bring a copy of your health care power of attorney and living will to the office to be added to your chart at your convenience.  Next Medicare Annual Wellness Visit scheduled for next year: Yes

## 2023-04-01 NOTE — Progress Notes (Signed)
Subjective:   Maureen Reyes is a 52 y.o. female who presents for Medicare Annual (Subsequent) preventive examination.  Visit Complete: Virtual  I connected with  Maureen Reyes on 04/01/23 by a audio enabled telemedicine application and verified that I am speaking with the correct person using two identifiers.  Patient Location: Home  Provider Location: Home Office  I discussed the limitations of evaluation and management by telemedicine. The patient expressed understanding and agreed to proceed.  Vital Signs: Because this visit was a virtual/telehealth visit, some criteria may be missing or patient reported. Any vitals not documented were not able to be obtained and vitals that have been documented are patient reported.    Review of Systems    Cardiac Risk Factors include: advanced age (>62men, >60 women);diabetes mellitus;dyslipidemia;obesity (BMI >30kg/m2)     Objective:    Today's Vitals   04/01/23 1117  Weight: 236 lb (107 kg)  Height: 5\' 9"  (1.753 m)   Body mass index is 34.85 kg/m.     04/01/2023   11:22 AM 01/15/2023   10:01 AM 10/14/2022    2:05 PM 07/15/2022    2:10 PM 04/01/2022   11:15 AM 11/26/2021    8:56 AM 08/15/2021    8:59 AM  Advanced Directives  Does Patient Have a Medical Advance Directive? Yes No No No No No No  Type of Estate agent of [place];Living will        Copy of Healthcare Power of Attorney in Chart? No - copy requested        Would patient like information on creating a medical advance directive?  No - Patient declined No - Patient declined No - Patient declined No - Patient declined No - Patient declined No - Patient declined    Current Medications (verified) Outpatient Encounter Medications as of 04/01/2023  Medication Sig   Accu-Chek Softclix Lancets lancets Use as instructed   Blood Glucose Monitoring Suppl (ACCU-CHEK GUIDE) w/Device KIT Use as directed   gabapentin (NEURONTIN) 100 MG capsule Take 3 capsules  (300 mg total) by mouth at bedtime.   glucose blood (ACCU-CHEK GUIDE) test strip Use as instructed   glucose blood (GNP TRUE METRIX GLUCOSE STRIPS) test strip Use as instructed to check blood sugar.   Insulin Pen Needle (TRUEPLUS 5-BEVEL PEN NEEDLES) 31G X 5 MM MISC 1 Box by Does not apply route as needed.   metFORMIN (GLUCOPHAGE) 1000 MG tablet Take 1 tablet (1,000 mg total) by mouth 2 (two) times daily with a meal.   rosuvastatin (CRESTOR) 20 MG tablet Take 1 tablet (20 mg total) by mouth daily.   Semaglutide,0.25 or 0.5MG /DOS, (OZEMPIC, 0.25 OR 0.5 MG/DOSE,) 2 MG/3ML SOPN Inject 0.25 mg into the skin once a week. Increase after 4 weeks if tolerating well to 0.5mg .   No facility-administered encounter medications on file as of 04/01/2023.    Allergies (verified) Hydrocodone   History: Past Medical History:  Diagnosis Date   Arthritis    Chronic headaches    Diabetes mellitus without complication (HCC)    HLD (hyperlipidemia)    Hot flashes 02/29/2020   Lumbar facet arthropathy    Neuropathy    Prediabetes    Sciatica of right side    Sciatica of right side 05/04/2014   Past Surgical History:  Procedure Laterality Date   COLONOSCOPY  2023   [facility]   TUBAL LIGATION  1990 pt reported   Family History  Problem Relation Age of Onset   Diabetes  Mother    Diabetes Mellitus II Mother    Thyroid disease Mother    Colon polyps Mother    Diabetes Father    Diabetes Mellitus II Father    Diabetes Maternal Aunt        several aunts and uncles   Heart disease Maternal Aunt    Diabetes Maternal Uncle        several aunts and uncles   Diabetes Paternal Aunt        several aunts and uncles   Diabetes Paternal Uncle        several aunts and uncles   Diabetes Maternal Grandmother    Diabetes Maternal Grandfather    Cancer Maternal Grandfather        type unknown   Diabetes Paternal Grandmother    Diabetes Paternal Grandfather    Colon cancer Neg Hx    Rectal cancer Neg Hx     Stomach cancer Neg Hx    Esophageal cancer Neg Hx    Social History   Socioeconomic History   Marital status: Single    Spouse name: Not on file   Number of children: 3   Years of education: Not on file   Highest education level: Some college, no degree  Occupational History   Occupation: Unemployed  Tobacco Use   Smoking status: Former    Current packs/day: 0.00    Average packs/day: 0.3 packs/day for 20.0 years (5.0 ttl pk-yrs)    Types: Cigarettes    Start date: 07/01/2000    Quit date: 07/01/2020    Years since quitting: 2.7   Smokeless tobacco: Never   Tobacco comments:    Stopped last year   Vaping Use   Vaping status: Never Used  Substance and Sexual Activity   Alcohol use: No   Drug use: No   Sexual activity: Not Currently    Birth control/protection: Surgical  Other Topics Concern   Not on file  Social History Narrative   Lives with someone, (partner)   Social Determinants of Health   Financial Resource Strain: Low Risk  (04/01/2023)   Overall Financial Resource Strain (CARDIA)    Difficulty of Paying Living Expenses: Not hard at all  Food Insecurity: No Food Insecurity (04/01/2023)   Hunger Vital Sign    Worried About Running Out of Food in the Last Year: Never true    Ran Out of Food in the Last Year: Never true  Transportation Needs: No Transportation Needs (04/01/2023)   PRAPARE - Administrator, Civil Service (Medical): No    Lack of Transportation (Non-Medical): No  Physical Activity: Insufficiently Active (04/01/2023)   Exercise Vital Sign    Days of Exercise per Week: 3 days    Minutes of Exercise per Session: 30 min  Stress: No Stress Concern Present (04/01/2023)   [employer] of Occupational Health - Occupational Stress Questionnaire    Feeling of Stress : Not at all  Social Connections: Moderately Isolated (04/01/2023)   Social Connection and Isolation Panel [NHANES]    Frequency of Communication with Friends and Family: More than three  times a week    Frequency of Social Gatherings with Friends and Family: Twice a week    Attends Religious Services: Never    Database administrator or Organizations: No    Attends Banker Meetings: Never    Marital Status: Living with partner    Tobacco Counseling Counseling given: Not Answered Tobacco comments: Stopped last year  Clinical Intake:  Pre-visit preparation completed: Yes  Pain : No/denies pain     BMI - recorded: 34.85 Nutritional Risks: None Diabetes: Yes CBG done?: Yes (115-fasting) CBG resulted in Enter/ Edit results?: No Did pt. bring in CBG monitor from home?: No  How often do you need to have someone help you when you read instructions, pamphlets, or other written materials from your doctor or pharmacy?: 1 - Never  Interpreter Needed?: No  Information entered by :: Maureen Reyes, RMA   Activities of Daily Living    04/01/2023   11:19 AM 01/15/2023   10:02 AM  In your present state of health, do you have any difficulty performing the following activities:  Hearing? 0 0  Vision? 0 0  Difficulty concentrating or making decisions? 0 0  Walking or climbing stairs? 0 0  Dressing or bathing? 0 0  Doing errands, shopping? 0 0  Preparing Food and eating ? N   Using the Toilet? N   In the past six months, have you accidently leaked urine? N   Do you have problems with loss of bowel control? N   Managing your Medications? N   Managing your Finances? N   Housekeeping or managing your Housekeeping? N     Patient Care Team: Maureen Messier, MD as PCP - General  Indicate any recent Medical Services you may have received from other than [facility] providers in the past year (date may be approximate).     Assessment:   This is a routine wellness examination for Maureen Reyes.  Hearing/Vision screen Hearing Screening - Comments:: Denies hearing difficulties   Vision Screening - Comments:: Wears eyeglasses  Dietary issues and exercise activities  discussed:     Goals Addressed               This Visit's Progress     Patient Stated (pt-stated)        Maintain health.      Depression Screen    04/01/2023   11:26 AM 10/14/2022    3:37 PM 10/14/2022    2:09 PM 07/15/2022    2:18 PM 04/01/2022   11:14 AM 11/26/2021    8:56 AM 08/15/2021    9:39 AM  PHQ 2/9 Scores  PHQ - 2 Score 0 0 0 0 0 0 0  PHQ- 9 Score 4      0    Fall Risk    04/01/2023   11:23 AM 01/15/2023   10:06 AM 10/14/2022    2:04 PM 07/15/2022    2:09 PM 04/01/2022   11:14 AM  Fall Risk   Falls in the past year? 0 0 0 0 0  Number falls in past yr: 0 0 0 0 0  Injury with Fall? 0 0 0 0 0  Risk for fall due to : No Fall Risks   No Fall Risks No Fall Risks  Follow up Falls prevention discussed;Falls evaluation completed Falls evaluation completed Falls evaluation completed Falls evaluation completed;Falls prevention discussed Falls evaluation completed;Falls prevention discussed    MEDICARE RISK AT HOME: Medicare Risk at Home Any stairs in or around the home?: No If so, are there any without handrails?: No Home free of loose throw rugs in walkways, pet beds, electrical cords, etc?: Yes Adequate lighting in your home to reduce risk of falls?: Yes Life alert?: No Use of a cane, walker or w/c?: No Grab bars in the bathroom?: No Shower chair or bench in shower?: No Elevated toilet seat or a  handicapped toilet?: No  TIMED UP AND GO:  Was the test performed?  No    Cognitive Function:        04/01/2023   11:23 AM  6CIT Screen  What Year? 0 points  What month? 0 points  What time? 0 points  Count back from 20 0 points  Months in reverse 0 points  Repeat phrase 0 points  Total Score 0 points    Immunizations Immunization History  Administered Date(s) Administered   Influenza,inj,Quad PF,6+ Mos 05/03/2014, 06/22/2017, 04/26/2018, 04/18/2019, 07/03/2021, 07/15/2022   Moderna SARS-COV2 Booster Vaccination 02/03/2021   Moderna Sars-Covid-2 Vaccination  08/07/2020, 09/04/2020   Tdap 12/09/2017    TDAP status: Up to date  Flu Vaccine status: Due, Education has been provided regarding the importance of this vaccine. Advised may receive this vaccine at local pharmacy or Health Dept. Aware to provide a copy of the vaccination record if obtained from local pharmacy or Health Dept. Verbalized acceptance and understanding.  Pneumococcal vaccine status: Due, Education has been provided regarding the importance of this vaccine. Advised may receive this vaccine at local pharmacy or Health Dept. Aware to provide a copy of the vaccination record if obtained from local pharmacy or Health Dept. Verbalized acceptance and understanding.  Covid-19 vaccine status: Information provided on how to obtain vaccines.   Qualifies for Shingles Vaccine? Yes   Zostavax completed Yes   Shingrix Completed?: Yes  Screening Tests Health Maintenance  Topic Date Due   Zoster Vaccines- Shingrix (1 of 2) Never done   Diabetic kidney evaluation - Urine ACR  11/27/2022   FOOT EXAM  11/27/2022   INFLUENZA VACCINE  02/26/2023   COVID-19 Vaccine (4 - 2023-24 season) 03/29/2023   OPHTHALMOLOGY EXAM  05/06/2023   HEMOGLOBIN A1C  07/17/2023   Diabetic kidney evaluation - eGFR measurement  10/23/2023   Colonoscopy  03/24/2024   Medicare Annual Wellness (AWV)  03/31/2024   MAMMOGRAM  06/03/2024   PAP SMEAR-Modifier  04/17/2025   DTaP/Tdap/Td (2 - Td or Tdap) 12/10/2027   Hepatitis C Screening  Completed   HIV Screening  Completed   HPV VACCINES  Aged Out    Health Maintenance  Health Maintenance Due  Topic Date Due   Zoster Vaccines- Shingrix (1 of 2) Never done   Diabetic kidney evaluation - Urine ACR  11/27/2022   FOOT EXAM  11/27/2022   INFLUENZA VACCINE  02/26/2023   COVID-19 Vaccine (4 - 2023-24 season) 03/29/2023    Colorectal cancer screening: Type of screening: Colonoscopy. Completed 03/25/2023. Repeat every 3 years  Mammogram status: Completed  06/03/2022. Repeat every year   Lung Cancer Screening: (Low Dose CT Chest recommended if Age 56-80 years, 20 pack-year currently smoking OR have quit w/in 15years.) does not qualify.   Lung Cancer Screening Referral: N/A  Additional Screening:  Hepatitis C Screening: does qualify; Completed 01/16/2023  Vision Screening: Recommended annual ophthalmology exams for early detection of glaucoma and other disorders of the eye. Is the patient up to date with their annual eye exam?  Yes  Who is the provider or what is the name of the office in which the patient attends annual eye exams? Dr. Dione Booze If pt is not established with a provider, would they like to be referred to a provider to establish care? No .   Dental Screening: Recommended annual dental exams for proper oral hygiene  Diabetic Foot Exam: Diabetic Foot Exam: Completed 12/2021  Community Resource Referral / Chronic Care Management: CRR required this visit?  No   CCM required this visit?  No     Plan:     I have personally reviewed and noted the following in the patient's chart:   Medical and social history Use of alcohol, tobacco or illicit drugs  Current medications and supplements including opioid prescriptions. Patient is not currently taking opioid prescriptions. Functional ability and status Nutritional status Physical activity Advanced directives List of other physicians Hospitalizations, surgeries, and ER visits in previous 12 months Vitals Screenings to include cognitive, depression, and falls Referrals and appointments  In addition, I have reviewed and discussed with patient certain preventive protocols, quality metrics, and best practice recommendations. A written personalized care plan for preventive services as well as general preventive health recommendations were provided to patient.     Victoriah Wilds L Hedaya Latendresse, CMA   04/01/2023   After Visit Summary: (MyChart) Due to this being a telephonic visit, the after  visit summary with patients personalized plan was offered to patient via MyChart   Nurse Notes: Patient is due for a Pneumonia vaccine.  She has an up coming appointment for a mammogram.  Patient will call the office for a 6 month follow up to get her foot exam done and also lab work that is past due (kidney evaluation).  Patient would like to discuss the Pneumonia vaccine during her next visit with PCP.

## 2023-04-07 ENCOUNTER — Other Ambulatory Visit: Payer: Self-pay | Admitting: Student

## 2023-04-07 DIAGNOSIS — E119 Type 2 diabetes mellitus without complications: Secondary | ICD-10-CM

## 2023-04-08 ENCOUNTER — Encounter: Payer: Self-pay | Admitting: Genetic Counselor

## 2023-04-08 ENCOUNTER — Other Ambulatory Visit (HOSPITAL_COMMUNITY): Payer: Self-pay

## 2023-04-08 ENCOUNTER — Other Ambulatory Visit: Payer: Self-pay | Admitting: Genetic Counselor

## 2023-04-08 ENCOUNTER — Other Ambulatory Visit: Payer: Self-pay

## 2023-04-08 ENCOUNTER — Inpatient Hospital Stay: Payer: 59

## 2023-04-08 ENCOUNTER — Inpatient Hospital Stay: Payer: 59 | Attending: Genetic Counselor | Admitting: Genetic Counselor

## 2023-04-08 DIAGNOSIS — D126 Benign neoplasm of colon, unspecified: Secondary | ICD-10-CM

## 2023-04-08 DIAGNOSIS — Z86010 Personal history of colonic polyps: Secondary | ICD-10-CM

## 2023-04-08 DIAGNOSIS — K635 Polyp of colon: Secondary | ICD-10-CM | POA: Insufficient documentation

## 2023-04-08 LAB — GENETIC SCREENING ORDER

## 2023-04-08 MED ORDER — OZEMPIC (0.25 OR 0.5 MG/DOSE) 2 MG/3ML ~~LOC~~ SOPN
0.2500 mg | PEN_INJECTOR | SUBCUTANEOUS | 2 refills | Status: DC
Start: 2023-04-08 — End: 2023-07-01
  Filled 2023-04-08: qty 3, 28d supply, fill #0
  Filled 2023-05-01: qty 3, 28d supply, fill #1
  Filled 2023-05-26: qty 3, 28d supply, fill #2

## 2023-04-08 NOTE — Progress Notes (Signed)
REFERRING PROVIDER: Imogene Burn, MD [address] [place],  [place] [address]  PRIMARY PROVIDER:  Olegario Messier, MD  PRIMARY REASON FOR VISIT:  1. Adenomatous polyp of colon, unspecified part of colon      HISTORY OF PRESENT ILLNESS:   Maureen Reyes, a 52 y.o. female, was seen for a [facility] cancer genetics consultation at the request of Dr. Leonides Schanz due to a personal history of polyposis.  Maureen Reyes presents to clinic today to discuss the possibility of a hereditary predisposition to cancer, genetic testing, and to further clarify her future cancer risks, as well as potential cancer risks for family members.   Maureen Reyes is a 52 y.o. female with no personal history of cancer.  The patient had 11 tubular adenomas on the last two colonoscopies and multiple hyperplastic polyps.  CANCER HISTORY:  Oncology History   No history exists.    Past Medical History:  Diagnosis Date   Arthritis    Chronic headaches    Colon polyps    Diabetes mellitus without complication (HCC)    HLD (hyperlipidemia)    Hot flashes 02/29/2020   Lumbar facet arthropathy    Neuropathy    Prediabetes    Sciatica of right side    Sciatica of right side 05/04/2014    Past Surgical History:  Procedure Laterality Date   COLONOSCOPY  2023   [facility]   TUBAL LIGATION  1990 pt reported    Social History   Socioeconomic History   Marital status: Single    Spouse name: Not on file   Number of children: 3   Years of education: Not on file   Highest education level: Some college, no degree  Occupational History   Occupation: Unemployed  Tobacco Use   Smoking status: Former    Current packs/day: 0.00    Average packs/day: 0.3 packs/day for 20.0 years (5.0 ttl pk-yrs)    Types: Cigarettes    Start date: 07/01/2000    Quit date: 07/01/2020    Years since quitting: 2.7   Smokeless tobacco: Never   Tobacco comments:    Stopped last year   Vaping Use   Vaping status: Never Used  Substance and  Sexual Activity   Alcohol use: No   Drug use: No   Sexual activity: Not Currently    Birth control/protection: Surgical  Other Topics Concern   Not on file  Social History Narrative   Lives with someone, (partner)   Social Determinants of Health   Financial Resource Strain: Low Risk  (04/01/2023)   Overall Financial Resource Strain (CARDIA)    Difficulty of Paying Living Expenses: Not hard at all  Food Insecurity: No Food Insecurity (04/01/2023)   Hunger Vital Sign    Worried About Running Out of Food in the Last Year: Never true    Ran Out of Food in the Last Year: Never true  Transportation Needs: No Transportation Needs (04/01/2023)   PRAPARE - Administrator, Civil Service (Medical): No    Lack of Transportation (Non-Medical): No  Physical Activity: Insufficiently Active (04/01/2023)   Exercise Vital Sign    Days of Exercise per Week: 3 days    Minutes of Exercise per Session: 30 min  Stress: No Stress Concern Present (04/01/2023)   [employer] of Occupational Health - Occupational Stress Questionnaire    Feeling of Stress : Not at all  Social Connections: Moderately Isolated (04/01/2023)   Social Connection and Isolation Panel [NHANES]  Frequency of Communication with Friends and Family: More than three times a week    Frequency of Social Gatherings with Friends and Family: Twice a week    Attends Religious Services: Never    Database administrator or Organizations: No    Attends Engineer, structural: Never    Marital Status: Living with partner     FAMILY HISTORY:  We obtained a detailed, 4-generation family history.  Significant diagnoses are listed below: Family History  Problem Relation Age of Onset   Diabetes Mother    Diabetes Mellitus II Mother    Thyroid disease Mother    Colon polyps Mother    Diabetes Father    Diabetes Mellitus II Father    Diabetes Maternal Aunt        several aunts and uncles   Heart disease Maternal Aunt     Diabetes Maternal Uncle        several aunts and uncles   Diabetes Paternal Aunt        several aunts and uncles   Diabetes Paternal Uncle        several aunts and uncles   Diabetes Maternal Grandmother    Diabetes Maternal Grandfather    Cancer Maternal Grandfather        type unknown   Diabetes Paternal Grandmother    Diabetes Paternal Grandfather    Colon cancer Neg Hx    Rectal cancer Neg Hx    Stomach cancer Neg Hx    Esophageal cancer Neg Hx      The patient has three sons who are cancer free.  She has three brothers who are cancer free.  Her mother is deceased and her father is living.  The patient's mother died of sepsis.  She had six sisters and two brothers, one sister had an unknown cancer.  Her father also had an unknown cancer.  The patient's father is living and has one sister who is cancer free.  His parents died of non-cancer related issues.  Maureen Reyes is unaware of previous family history of genetic testing for hereditary cancer risks. There is no reported Ashkenazi Jewish ancestry. There is no known consanguinity.  GENETIC COUNSELING ASSESSMENT: Maureen Reyes is a 52 y.o. female with a personal history of polyposis which is somewhat suggestive of a hereditary cancer syndrome and predisposition to cancer given her polyp count. We, therefore, discussed and recommended the following at today's visit.   DISCUSSION: We discussed that, in general, most cancer is not inherited in families, but instead is sporadic or familial. Sporadic cancers occur by chance and typically happen at older ages (>50 years) as this type of cancer is caused by genetic changes acquired during an individual's lifetime. Some families have more cancers than would be expected by chance; however, the ages or types of cancer are not consistent with a known genetic mutation or known genetic mutations have been ruled out. This type of familial cancer is thought to be due to a combination of multiple genetic,  environmental, hormonal, and lifestyle factors. While this combination of factors likely increases the risk of cancer, the exact source of this risk is not currently identifiable or testable.  We discussed that many people have colon polyps, but most will have fewer than five. When an individual has greater then 10 pre-cancerous polyps it is called polyposis and can increase the risk for colon cancer. The most common cause of hereditary polyposis is due to pathogenic variants in the APC gene.  There are other genes associated with hereditary polyposis syndrome, including MUTYH and BMPR1A.  We discussed that testing is beneficial for several reasons including knowing how to follow individuals after completing their treatment, and understand if other family members could be at risk for cancer and allow them to undergo genetic testing.   We reviewed the characteristics, features and inheritance patterns of hereditary cancer syndromes. We also discussed genetic testing, including the appropriate family members to test, the process of testing, insurance coverage and turn-around-time for results. We discussed the implications of a negative, positive, carrier and/or variant of uncertain significant result. Maureen Reyes  was offered a common hereditary cancer panel (40+ genes) and an expanded pan-cancer panel (70+ genes). Maureen Reyes was informed of the benefits and limitations of each panel, including that expanded pan-cancer panels contain genes that do not have clear management guidelines at this point in time.  We also discussed that as the number of genes included on a panel increases, the chances of variants of uncertain significance increases. Maureen Reyes decided to pursue genetic testing for the CancerNext-Expanded+RNAinsight gene panel.   The CancerNext-Expanded gene panel offered by [facility] and includes sequencing and rearrangement analysis for the following 77 genes: AIP, ALK, APC*, ATM*, AXIN2, BAP1,  BARD1, BMPR1A, BRCA1*, BRCA2*, BRIP1*, CDC73, CDH1*, CDK4, CDKN1B, CDKN2A, CHEK2*, CTNNA1, DICER1, FH, FLCN, KIF1B, LZTR1, MAX, MEN1, MET, MLH1*, MSH2*, MSH3, MSH6*, MUTYH*, NF1*, NF2, NTHL1, PALB2*, PHOX2B, PMS2*, POT1, PRKAR1A, PTCH1, PTEN*, RAD51C*, RAD51D*, RB1, RET, SDHA, SDHAF2, SDHB, SDHC, SDHD, SMAD4, SMARCA4, SMARCB1, SMARCE1, STK11, SUFU, TMEM127, TP53*, TSC1, TSC2, and VHL (sequencing and deletion/duplication); EGFR, EGLN1, HOXB13, KIT, MITF, PDGFRA, POLD1, and POLE (sequencing only); EPCAM and GREM1 (deletion/duplication only). DNA and RNA analyses performed for * genes.   Based on Maureen Reyes's personal history of cancer, she meets medical criteria for genetic testing. Despite that she meets criteria, she may still have an out of pocket cost. We discussed that if her out of pocket cost for testing is over $100, the laboratory will call and confirm whether she wants to proceed with testing.  If the out of pocket cost of testing is less than $100 she will be billed by the genetic testing laboratory.   We discussed that some people do not want to undergo genetic testing due to fear of genetic discrimination.  The Genetic Information Nondiscrimination Act (GINA) was signed into federal law in 2008. GINA prohibits health insurers and most employers from discriminating against individuals based on genetic information (including the results of genetic tests and family history information). According to GINA, health insurance companies cannot consider genetic information to be a preexisting condition, nor can they use it to make decisions regarding coverage or rates. GINA also makes it illegal for most employers to use genetic information in making decisions about hiring, firing, promotion, or terms of employment. It is important to note that GINA does not offer protections for life insurance, disability insurance, or long-term care insurance. GINA does not apply to those in the [employer], those who work for  companies with less than 15 employees, and new life insurance or long-term disability insurance policies.  Health status due to a cancer diagnosis is not protected under GINA. More information about GINA can be found by visiting [email or web address].    PLAN: After considering the risks, benefits, and limitations, Maureen Reyes provided informed consent to pursue genetic testing and the blood sample was sent to [facility] for analysis of the CancerNext-Expanded+RNAinsight. Results should be available within  approximately 2-3 weeks' time, at which point they will be disclosed by telephone to Maureen Reyes, as will any additional recommendations warranted by these results. Maureen Reyes will receive a summary of her genetic counseling visit and a copy of her results once available. This information will also be available in Epic.   Lastly, we encouraged Maureen Reyes to remain in contact with cancer genetics annually so that we can continuously update the family history and inform her of any changes in cancer genetics and testing that may be of benefit for this family.   Maureen Reyes questions were answered to her satisfaction today. Our contact information was provided should additional questions or concerns arise. Thank you for the referral and allowing [place] to share in the care of your patient.   Kashia Brossard P. Lowell Guitar, MS, [facility] Licensed, Patent attorney [email or web address] phone: [phone]  The patient was seen for a total of 38 minutes in face-to-face genetic counseling.  The patient was seen alone.  Drs. Meliton Rattan, and/or Sorrel were available for questions, if needed..    _______________________________________________________________________ For Office Staff:  Number of people involved in session: 1 Was an Intern/ student involved with case: no

## 2023-04-20 ENCOUNTER — Telehealth: Payer: Self-pay

## 2023-04-20 ENCOUNTER — Ambulatory Visit: Payer: Self-pay | Admitting: Genetic Counselor

## 2023-04-20 ENCOUNTER — Telehealth: Payer: Self-pay | Admitting: Genetic Counselor

## 2023-04-20 ENCOUNTER — Encounter: Payer: Self-pay | Admitting: Genetic Counselor

## 2023-04-20 DIAGNOSIS — Z1379 Encounter for other screening for genetic and chromosomal anomalies: Secondary | ICD-10-CM

## 2023-04-20 NOTE — Telephone Encounter (Signed)
LM on VM that results are back and to please call.  Left CB instructions. 

## 2023-04-20 NOTE — Progress Notes (Signed)
HPI:  Ms. Mcfaul was previously seen in the [facility] due to a personal history of polyposis and family history of cancer and concerns regarding a hereditary predisposition to cancer. Please refer to our prior cancer genetics clinic note for more information regarding our discussion, assessment and recommendations, at the time. Ms. Delafosse recent genetic test results were disclosed to her, as were recommendations warranted by these results. These results and recommendations are discussed in more detail below.  CANCER HISTORY:  Oncology History   No history exists.    FAMILY HISTORY:  We obtained a detailed, 4-generation family history.  Significant diagnoses are listed below: Family History  Problem Relation Age of Onset   Diabetes Mother    Diabetes Mellitus II Mother    Thyroid disease Mother    Colon polyps Mother    Diabetes Father    Diabetes Mellitus II Father    Diabetes Maternal Grandmother    Diabetes Maternal Grandfather    Pancreatic disease Maternal Grandfather    Diabetes Paternal Grandmother    Diabetes Paternal Grandfather    Diabetes Maternal Aunt        several aunts and uncles   Heart disease Maternal Aunt    Pancreatic cancer Maternal Aunt    Diabetes Maternal Uncle        several aunts and uncles   Pancreatic cancer Maternal Uncle    Diabetes Paternal Aunt        several aunts and uncles   Diabetes Paternal Uncle        several aunts and uncles   Colon cancer Neg Hx    Rectal cancer Neg Hx    Stomach cancer Neg Hx    Esophageal cancer Neg Hx        The patient has three sons who are cancer free.  She has three brothers who are cancer free.  Her mother is deceased and her father is living.   The patient's mother died of sepsis.  She had six sisters and two brothers, one sister had an unknown cancer, one sister had pancreatic cancer and one brother had pancreatic cancer.  Her father also had pancreatic cancer.   The patient's  father is living and has one sister who is cancer free.  His parents died of non-cancer related issues.   Ms. Nevils is unaware of previous family history of genetic testing for hereditary cancer risks. There is no reported Ashkenazi Jewish ancestry. There is no known consanguinity.  GENETIC TEST RESULTS: Genetic testing reported out on April 16, 2023 through the CancerNext-Expanded+RNAinsight cancer panel found no pathogenic mutations. The CancerNext-Expanded gene panel offered by [facility] and includes sequencing and rearrangement analysis for the following 77 genes: AIP, ALK, APC*, ATM*, AXIN2, BAP1, BARD1, BMPR1A, BRCA1*, BRCA2*, BRIP1*, CDC73, CDH1*, CDK4, CDKN1B, CDKN2A, CHEK2*, CTNNA1, DICER1, FH, FLCN, KIF1B, LZTR1, MAX, MEN1, MET, MLH1*, MSH2*, MSH3, MSH6*, MUTYH*, NF1*, NF2, NTHL1, PALB2*, PHOX2B, PMS2*, POT1, PRKAR1A, PTCH1, PTEN*, RAD51C*, RAD51D*, RB1, RET, SDHA, SDHAF2, SDHB, SDHC, SDHD, SMAD4, SMARCA4, SMARCB1, SMARCE1, STK11, SUFU, TMEM127, TP53*, TSC1, TSC2, and VHL (sequencing and deletion/duplication); EGFR, EGLN1, HOXB13, KIT, MITF, PDGFRA, POLD1, and POLE (sequencing only); EPCAM and GREM1 (deletion/duplication only). DNA and RNA analyses performed for * genes. The test report has been scanned into EPIC and is located under the Molecular Pathology section of the Results Review tab.  A portion of the result report is included below for reference.     We discussed with Ms. Eschbach that because  current genetic testing is not perfect, it is possible there may be a gene mutation in one of these genes that current testing cannot detect, but that chance is small.  We also discussed, that there could be another gene that has not yet been discovered, or that we have not yet tested, that is responsible for the cancer diagnoses in the family. It is also possible there is a hereditary cause for the cancer in the family that Ms. Ostlund did not inherit and therefore was not identified in her  testing.  Therefore, it is important to remain in touch with cancer genetics in the future so that we can continue to offer Ms. Deadwyler the most up to date genetic testing.   ADDITIONAL GENETIC TESTING: We discussed with Ms. Mikaelian that her genetic testing was fairly extensive.  If there are genes identified to increase cancer risk that can be analyzed in the future, we would be happy to discuss and coordinate this testing at that time.    CANCER SCREENING RECOMMENDATIONS: Ms. Thummel test result is considered negative (normal).  This means that we have not identified a hereditary cause for her personal history of polyps and family history of cancer at this time. Most cancers happen by chance and this negative test suggests that her personal history of polyposis may fall into this category.    Possible reasons for Ms. Stines's negative genetic test include:  1. There may be a gene mutation in one of these genes that current testing methods cannot detect but that chance is small.  2. There could be another gene that has not yet been discovered, or that we have not yet tested, that is responsible for the cancer diagnoses in the family.  3.  There may be no hereditary risk for cancer in the family. The cancers in Ms. Baucom and/or her family may be sporadic/familial or due to other genetic and environmental factors. 4. It is also possible there is a hereditary cause for the cancer in the family that Ms. Blunck did not inherit.  Ms. Trachtman updated her family history revealing that her maternal grandfather, one maternal aunt and one maternal uncle had pancreatic cancer.  This falls into a familial pancreatic cancer pattern.  We offered a referral for high risk pancreatic cancer screening.  Ms. Miska accepted this referral.  Therefore, it is recommended she continue to follow the cancer management and screening guidelines provided by her primary healthcare provider. An individual's cancer risk and medical  management are not determined by genetic test results alone. Overall cancer risk assessment incorporates additional factors, including personal medical history, family history, and any available genetic information that may result in a personalized plan for cancer prevention and surveillance  RECOMMENDATIONS FOR FAMILY MEMBERS:  Individuals in this family might be at some increased risk of developing cancer, over the general population risk, simply due to the family history of cancer.  We recommended women in this family have a yearly mammogram beginning at age 57, or 54 years younger than the earliest onset of cancer, an annual clinical breast exam, and perform monthly breast self-exams. Women in this family should also have a gynecological exam as recommended by their primary provider. All family members should be referred for colonoscopy starting at age 62.  FOLLOW-UP: Lastly, we discussed with Ms. Grable that cancer genetics is a rapidly advancing field and it is possible that new genetic tests will be appropriate for her and/or her family members in the future. We encouraged  her to remain in contact with cancer genetics on an annual basis so we can update her personal and family histories and let her know of advances in cancer genetics that may benefit this family.   Our contact number was provided. Ms. Labranche questions were answered to her satisfaction, and she knows she is welcome to call us at anytime with additional questions or concerns.   Maylon Cos, MS, [facility] Licensed, Certified Genetic Counselor [email or web address]

## 2023-04-20 NOTE — Telephone Encounter (Signed)
Can one of you please set this pt up with Dr Leonides Schanz, Barron Alvine, or Dr Meridee Score in the office to discuss pancreatic cancer screening?

## 2023-04-20 NOTE — Telephone Encounter (Signed)
-----   Message from [facility] sent at 04/20/2023  3:22 PM EDT ----- Regarding: RE: High risk PC screening KLP, Thanks for reaching out with this referral. Since she does have 3 relatives with pancreas cancer, she would meet criteria for [place] to consider moving forward with high risk pancreatic cancer screening. We will have our team reach out to the patient to discuss setting up a clinic visit so that we can discuss what this would involve with alternating yearly MRIs/EUS and see if she is interested in moving forward with this. CD or VC or I can talk with her about this. I did try to call her on the phone but was not successful at reaching her. Thanks. GM  Baron Parmelee, Can you please reach out to patient and offer her a clinic visit with any of [place] so that we can talk with her about what pancreatic cancer screening involves/entail and make sure that she does want to go through with it then that would be great?  Can use a 350PM slot for me. Thanks. GM ----- Message ----- From: Daisy Lazar, Counselor Sent: 04/20/2023   1:27 PM EDT To: Lemar Lofty., MD; # Subject: High risk PC screening                         Just FYI - I referred her for high risk screening.  Hopefully you can see this in your referral WQ.  Thanks,   KPP

## 2023-04-20 NOTE — Telephone Encounter (Signed)
Revealed negative genetic testing.  Discussed that we do not know why she has polyposis or why there is cancer in the family. It could be due to a different gene that we are not testing, or maybe our current technology may not be able to pick something up.  It will be important for her to keep in contact with genetics to keep up with whether additional testing may be needed.   Patient updated her maternal family history.  Three individuals have been identified with pancreatic cancer.  She meets familial pancreatic cancer screening guidelines.  We will refer for high risk pancreatic cancer screening.

## 2023-04-22 ENCOUNTER — Encounter: Payer: Self-pay | Admitting: Gastroenterology

## 2023-04-22 NOTE — Telephone Encounter (Signed)
Patient has been scheduled for next available with Dr. Meridee Score in Jan.

## 2023-05-01 ENCOUNTER — Other Ambulatory Visit (HOSPITAL_COMMUNITY): Payer: Self-pay

## 2023-05-01 ENCOUNTER — Other Ambulatory Visit: Payer: Self-pay | Admitting: Student

## 2023-05-01 ENCOUNTER — Other Ambulatory Visit: Payer: Self-pay

## 2023-05-01 DIAGNOSIS — G6289 Other specified polyneuropathies: Secondary | ICD-10-CM

## 2023-05-01 MED ORDER — GABAPENTIN 100 MG PO CAPS
300.0000 mg | ORAL_CAPSULE | Freq: Every day | ORAL | 3 refills | Status: DC
Start: 2023-05-01 — End: 2023-08-27
  Filled 2023-05-01: qty 90, 30d supply, fill #0
  Filled 2023-05-26: qty 90, 30d supply, fill #1
  Filled 2023-06-30: qty 90, 30d supply, fill #2
  Filled 2023-07-26: qty 90, 30d supply, fill #3

## 2023-05-04 ENCOUNTER — Other Ambulatory Visit: Payer: Self-pay | Admitting: Student

## 2023-05-04 DIAGNOSIS — Z1231 Encounter for screening mammogram for malignant neoplasm of breast: Secondary | ICD-10-CM

## 2023-05-13 DIAGNOSIS — D3132 Benign neoplasm of left choroid: Secondary | ICD-10-CM | POA: Diagnosis not present

## 2023-05-13 DIAGNOSIS — E119 Type 2 diabetes mellitus without complications: Secondary | ICD-10-CM | POA: Diagnosis not present

## 2023-05-13 DIAGNOSIS — H5213 Myopia, bilateral: Secondary | ICD-10-CM | POA: Diagnosis not present

## 2023-05-13 DIAGNOSIS — D3131 Benign neoplasm of right choroid: Secondary | ICD-10-CM | POA: Diagnosis not present

## 2023-05-13 DIAGNOSIS — H25813 Combined forms of age-related cataract, bilateral: Secondary | ICD-10-CM | POA: Diagnosis not present

## 2023-05-18 ENCOUNTER — Ambulatory Visit: Payer: 59 | Admitting: Student

## 2023-05-18 VITALS — BP 114/75 | HR 66 | Temp 98.1°F | Ht 68.0 in | Wt 222.4 lb

## 2023-05-18 DIAGNOSIS — E1142 Type 2 diabetes mellitus with diabetic polyneuropathy: Secondary | ICD-10-CM

## 2023-05-18 DIAGNOSIS — M545 Low back pain, unspecified: Secondary | ICD-10-CM | POA: Diagnosis not present

## 2023-05-18 DIAGNOSIS — E119 Type 2 diabetes mellitus without complications: Secondary | ICD-10-CM

## 2023-05-18 DIAGNOSIS — Z7985 Long-term (current) use of injectable non-insulin antidiabetic drugs: Secondary | ICD-10-CM

## 2023-05-18 DIAGNOSIS — E038 Other specified hypothyroidism: Secondary | ICD-10-CM | POA: Diagnosis not present

## 2023-05-18 DIAGNOSIS — E02 Subclinical iodine-deficiency hypothyroidism: Secondary | ICD-10-CM

## 2023-05-18 DIAGNOSIS — G8929 Other chronic pain: Secondary | ICD-10-CM

## 2023-05-18 DIAGNOSIS — Z7984 Long term (current) use of oral hypoglycemic drugs: Secondary | ICD-10-CM | POA: Diagnosis not present

## 2023-05-18 DIAGNOSIS — G6289 Other specified polyneuropathies: Secondary | ICD-10-CM

## 2023-05-18 DIAGNOSIS — Z23 Encounter for immunization: Secondary | ICD-10-CM | POA: Diagnosis not present

## 2023-05-18 LAB — GLUCOSE, CAPILLARY: Glucose-Capillary: 89 mg/dL (ref 70–99)

## 2023-05-18 LAB — POCT GLYCOSYLATED HEMOGLOBIN (HGB A1C): Hemoglobin A1C: 6.2 % — AB (ref 4.0–5.6)

## 2023-05-18 NOTE — Patient Instructions (Addendum)
Thank you so much for coming to the clinic today!   I recommend taking the gabapentin 300mg  at night, and continue the 100mg  twice a day. If the pain does not get better please come back to the clinic. I will also give you a call to check in with your lab results.   If you have any questions please feel free to the call the clinic at anytime at [phone]. It was a pleasure seeing you!  Best, Dr. Thomasene Ripple

## 2023-05-19 LAB — T4: T4, Total: 7.5 ug/dL (ref 4.5–12.0)

## 2023-05-19 LAB — TSH: TSH: 5.75 u[IU]/mL — ABNORMAL HIGH (ref 0.450–4.500)

## 2023-05-19 LAB — MICROALBUMIN / CREATININE URINE RATIO
Creatinine, Urine: 216.8 mg/dL
Microalb/Creat Ratio: 7 mg/g{creat} (ref 0–29)
Microalbumin, Urine: 15.9 ug/mL

## 2023-05-19 NOTE — Progress Notes (Signed)
CC: Diabetes follow-up and back pain  HPI:  Maureen Reyes is a 52 y.o. female living with a history stated below and presents today for diabetes follow-up and back pain. Please see problem based assessment and plan for additional details.  Past Medical History:  Diagnosis Date   Arthritis    Chronic headaches    Colon polyps    Diabetes mellitus without complication (HCC)    HLD (hyperlipidemia)    Hot flashes 02/29/2020   Lumbar facet arthropathy    Neuropathy    Prediabetes    Sciatica of right side    Sciatica of right side 05/04/2014    Current Outpatient Medications on File Prior to Visit  Medication Sig Dispense Refill   Accu-Chek Softclix Lancets lancets Use as instructed 100 each 12   Blood Glucose Monitoring Suppl (ACCU-CHEK GUIDE) w/Device KIT Use as directed 1 kit 3   gabapentin (NEURONTIN) 100 MG capsule Take 3 capsules (300 mg total) by mouth at bedtime. 90 capsule 3   glucose blood (ACCU-CHEK GUIDE) test strip Use as instructed 100 each 12   glucose blood (GNP TRUE METRIX GLUCOSE STRIPS) test strip Use as instructed to check blood sugar. 100 each 12   Insulin Pen Needle (TRUEPLUS 5-BEVEL PEN NEEDLES) 31G X 5 MM MISC 1 Box by Does not apply route as needed. 1 each 0   metFORMIN (GLUCOPHAGE) 1000 MG tablet Take 1 tablet (1,000 mg total) by mouth 2 (two) times daily with a meal. 120 tablet 5   rosuvastatin (CRESTOR) 20 MG tablet Take 1 tablet (20 mg total) by mouth daily. 90 tablet 2   Semaglutide,0.25 or 0.5MG /DOS, (OZEMPIC, 0.25 OR 0.5 MG/DOSE,) 2 MG/3ML SOPN Inject 0.25 mg into the skin once a week. Increase after 4 weeks if tolerating well to 0.5mg . 3 mL 2   No current facility-administered medications on file prior to visit.    Family History  Problem Relation Age of Onset   Diabetes Mother    Diabetes Mellitus II Mother    Thyroid disease Mother    Colon polyps Mother    Diabetes Father    Diabetes Mellitus II Father    Diabetes Maternal  Grandmother    Diabetes Maternal Grandfather    Pancreatic disease Maternal Grandfather    Diabetes Paternal Grandmother    Diabetes Paternal Grandfather    Diabetes Maternal Aunt        several aunts and uncles   Heart disease Maternal Aunt    Pancreatic cancer Maternal Aunt    Diabetes Maternal Uncle        several aunts and uncles   Pancreatic cancer Maternal Uncle    Diabetes Paternal Aunt        several aunts and uncles   Diabetes Paternal Uncle        several aunts and uncles   Colon cancer Neg Hx    Rectal cancer Neg Hx    Stomach cancer Neg Hx    Esophageal cancer Neg Hx     Social History   Socioeconomic History   Marital status: Single    Spouse name: Not on file   Number of children: 3   Years of education: Not on file   Highest education level: Some college, no degree  Occupational History   Occupation: Unemployed  Tobacco Use   Smoking status: Former    Current packs/day: 0.00    Average packs/day: 0.3 packs/day for 20.0 years (5.0 ttl pk-yrs)    Types: Cigarettes  Start date: 07/01/2000    Quit date: 07/01/2020    Years since quitting: 2.8   Smokeless tobacco: Never   Tobacco comments:    Stopped last year   Vaping Use   Vaping status: Never Used  Substance and Sexual Activity   Alcohol use: No   Drug use: No   Sexual activity: Not Currently    Birth control/protection: Surgical  Other Topics Concern   Not on file  Social History Narrative   Lives with someone, (partner)   Social Determinants of Health   Financial Resource Strain: Low Risk  (04/01/2023)   Overall Financial Resource Strain (CARDIA)    Difficulty of Paying Living Expenses: Not hard at all  Food Insecurity: No Food Insecurity (04/01/2023)   Hunger Vital Sign    Worried About Running Out of Food in the Last Year: Never true    Ran Out of Food in the Last Year: Never true  Transportation Needs: No Transportation Needs (04/01/2023)   PRAPARE - Scientist, research (physical sciences) (Medical): No    Lack of Transportation (Non-Medical): No  Physical Activity: Insufficiently Active (04/01/2023)   Exercise Vital Sign    Days of Exercise per Week: 3 days    Minutes of Exercise per Session: 30 min  Stress: No Stress Concern Present (04/01/2023)   [employer] of Occupational Health - Occupational Stress Questionnaire    Feeling of Stress : Not at all  Social Connections: Moderately Isolated (04/01/2023)   Social Connection and Isolation Panel [NHANES]    Frequency of Communication with Friends and Family: More than three times a week    Frequency of Social Gatherings with Friends and Family: Twice a week    Attends Religious Services: Never    Database administrator or Organizations: No    Attends Banker Meetings: Never    Marital Status: Living with partner  Intimate Partner Violence: Not At Risk (04/01/2023)   Humiliation, Afraid, Rape, and Kick questionnaire    Fear of Current or Ex-Partner: No    Emotionally Abused: No    Physically Abused: No    Sexually Abused: No    Review of Systems: ROS negative except for what is noted on the assessment and plan.  Vitals:   05/18/23 1312  BP: 114/75  Pulse: 66  Temp: 98.1 F (36.7 C)  TempSrc: Oral  SpO2: 98%  Weight: 222 lb 6.4 oz (100.9 kg)  Height: 5\' 8"  (1.727 m)    Physical Exam: Constitutional: well-appearing female in no acute distress Cardiovascular: regular rate and rhythm, no m/r/g Pulmonary/Chest: normal work of breathing on room air, lungs clear to auscultation bilaterally Abdominal: soft, non-tender, non-distended MSK: normal bulk and tone Neurological: alert & oriented x 3, 5/5 strength in bilateral upper and lower extremities, normal gait, foot exam unremarkable   Assessment & Plan:   Subclinical hypothyroidism Last TSH and T4 checked 3 years ago, TSH of 5.04 and T4.91.  Will recheck today.  Controlled type 2 diabetes mellitus (HCC) A1c currently at 6.2,  unchanged from 4 months ago of 6.2.  Current regimen is metformin 1000 mg twice a day and Ozempic 0.5 mg.  She did bring in her glucometer, which showed an average glucose of 117, highest being 194 and lowest being 105.  Overall, she is compliant with her regimen and her weight is also decreased from 236 in June to 222 at this encounter.  Will not make any changes at this time.  Plan: -  Metformin at 1000 mg twice a day - Ozempic 0.5 mg weekly, consider up titration - Checking urine microalbumin - Referral placed ophthalmology for eye exam  Chronic lower back pain Patient with history of chronic lower back pain that radiates down her posterior left leg.  She describes the pain as achy, and sometimes sharp.  It is worse when she lays down, and also happens when she is standing and walking around as well.  Per chart review, she has had an extensive history of this and has tried multiple regimens including Tylenol, ibuprofen, and tramadol with no relief.  She has never been to physical therapy before.  She did have an MRI of her lumbar spine in July of this year which did not show any disc bulging that would explain the symptoms that she is having.  Overall patient's pain seems consistent with radicular/neuropathic pain.  She does take gabapentin for her diabetic neuropathy of her feet, encourage patient to take 300 mg of gabapentin at night which is when the pain is the worst.  Plan: - Change gabapentin dosing to 100 mg in the morning, 100 mg in the afternoon, and 300 mg at night - Will place referral for physical therapy  Patient discussed with Dr. Lennon Alstrom Akshat Minehart, M.D. [facility] Internal Medicine, PGY-2 Pager: [phone] Date 05/19/2023 Time 10:34 AM

## 2023-05-19 NOTE — Assessment & Plan Note (Signed)
Patient with history of chronic lower back pain that radiates down her posterior left leg.  She describes the pain as achy, and sometimes sharp.  It is worse when she lays down, and also happens when she is standing and walking around as well.  Per chart review, she has had an extensive history of this and has tried multiple regimens including Tylenol, ibuprofen, and tramadol with no relief.  She has never been to physical therapy before.  She did have an MRI of her lumbar spine in July of this year which did not show any disc bulging that would explain the symptoms that she is having.  Overall patient's pain seems consistent with radicular/neuropathic pain.  She does take gabapentin for her diabetic neuropathy of her feet, encourage patient to take 300 mg of gabapentin at night which is when the pain is the worst.  Plan: - Change gabapentin dosing to 100 mg in the morning, 100 mg in the afternoon, and 300 mg at night - Will place referral for physical therapy

## 2023-05-19 NOTE — Assessment & Plan Note (Signed)
Last TSH and T4 checked 3 years ago, TSH of 5.04 and T4.91.  Will recheck today.

## 2023-05-19 NOTE — Assessment & Plan Note (Addendum)
A1c currently at 6.2, unchanged from 4 months ago of 6.2.  Current regimen is metformin 1000 mg twice a day and Ozempic 0.5 mg.  She did bring in her glucometer, which showed an average glucose of 117, highest being 194 and lowest being 105.  Overall, she is compliant with her regimen and her weight is also decreased from 236 in June to 222 at this encounter.  Will not make any changes at this time.  Plan: - Metformin at 1000 mg twice a day - Ozempic 0.5 mg weekly, consider up titration - Checking urine microalbumin - Referral placed ophthalmology for eye exam

## 2023-05-21 ENCOUNTER — Other Ambulatory Visit: Payer: Self-pay

## 2023-05-21 NOTE — Progress Notes (Signed)
Internal Medicine Clinic Attending  Case discussed with the resident at the time of the visit.  We reviewed the resident's history and exam and pertinent patient test results.  I agree with the assessment, diagnosis, and plan of care documented in the resident's note.  

## 2023-05-21 NOTE — Addendum Note (Signed)
Addended by: Dickie La on: 05/21/2023 04:27 PM   Modules accepted: Level of Service

## 2023-05-29 DIAGNOSIS — H5213 Myopia, bilateral: Secondary | ICD-10-CM | POA: Diagnosis not present

## 2023-06-09 ENCOUNTER — Other Ambulatory Visit (HOSPITAL_COMMUNITY): Payer: Self-pay

## 2023-06-09 ENCOUNTER — Ambulatory Visit
Admission: RE | Admit: 2023-06-09 | Discharge: 2023-06-09 | Disposition: A | Discharge status: Home / Self Care | Payer: 59 | Source: Ambulatory Visit | Attending: Diagnostic Neuroimaging | Admitting: Diagnostic Neuroimaging

## 2023-06-09 DIAGNOSIS — Z1231 Encounter for screening mammogram for malignant neoplasm of breast: Secondary | ICD-10-CM

## 2023-06-16 ENCOUNTER — Other Ambulatory Visit: Payer: Self-pay | Admitting: Student

## 2023-06-19 NOTE — Therapy (Signed)
OUTPATIENT PHYSICAL THERAPY NEURO EVALUATION   Patient Name: Maureen Reyes MRN: [id number] DOB:1971-06-14, 52 y.o., female Today's Date: 06/22/2023   PCP: Olegario Messier, MD  REFERRING PROVIDER: Earl Lagos, MD   END OF SESSION:  PT End of Session - 06/22/23 1545     Visit Number 1    Number of Visits 13    Date for PT Re-Evaluation 08/03/23    Authorization Type UHC Medicare-Medicaid Dual Complete    Authorization - Number of Visits 27    PT Start Time 1452    PT Stop Time 1524    PT Time Calculation (min) 32 min    Activity Tolerance Patient tolerated treatment well    Behavior During Therapy WFL for tasks assessed/performed             Past Medical History:  Diagnosis Date   Arthritis    Chronic headaches    Colon polyps    Diabetes mellitus without complication (HCC)    HLD (hyperlipidemia)    Hot flashes 02/29/2020   Lumbar facet arthropathy    Neuropathy    Prediabetes    Sciatica of right side    Sciatica of right side 05/04/2014   Past Surgical History:  Procedure Laterality Date   COLONOSCOPY  2023   [facility]   TUBAL LIGATION  1990 pt reported   Patient Active Problem List   Diagnosis Date Noted   Genetic testing 04/20/2023   Colon polyps    Mononeuropathy 10/14/2022   Positive FIT (fecal immunochemical test) 07/24/2021   Controlled type 2 diabetes mellitus (HCC) 07/03/2021   Hypercholesteremia 07/03/2021   Chronic lower back pain 11/26/2020   Subclinical hypothyroidism 04/19/2019   Ganglion cyst 04/19/2019   Obesity (BMI 30-39.9) 12/09/2017   Atopic dermatitis 02/18/2017   White coat syndrome with high blood pressure but without hypertension 02/18/2017   Polyneuropathy 11/28/2014   Lumbar facet arthropathy 05/04/2014   Healthcare maintenance 05/04/2014   Tobacco use disorder, mild, in sustained remission 05/04/2014    ONSET DATE: "a while"  REFERRING DIAG: G62.89 (ICD-10-CM) - Other polyneuropathy  THERAPY DIAG:  Other  low back pain  Other abnormalities of gait and mobility  Muscle weakness (generalized)  Rationale for Evaluation and Treatment: Rehabilitation  SUBJECTIVE:                                                                                                                                                                                             SUBJECTIVE STATEMENT: Patient reports that she is not sure if she has neuropathy, but reports that she is starting to think it is sciatica. Reports  chronic back pain for 15-20 years since having a large tv falling on her back. Now having trouble with L LE pain for "a while" but gapapentin/other meds don't work. Most pain starts in the L LBP and radiates to the L ankle. Occasionally tingling, occasionally burning in her feet. Occasionally also gets sharp nerve pain in either lower leg which throws her off balance. Worse at night but no other known aggravating factors. Denies B&B changes. Denies falls or particular issues with balance.   Pt accompanied by: self  PERTINENT HISTORY: HA's, DM, HLD, neuropathy  PAIN:  Are you having pain? Yes: NPRS scale: 4/10 Pain location: L LB Pain description: dull Aggravating factors: night time Relieving factors: massage, stretching her back, "be active" lower leg brace  PRECAUTIONS: None  RED FLAGS: None   WEIGHT BEARING RESTRICTIONS: No  FALLS: Has patient fallen in last 6 months? 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  DIAGNOSTIC FINDINGS: none recent  COGNITION: Overall cognitive status: Within functional limits for tasks  assessed   SENSATION: Intact to light touch in B LEs   POSTURE:  anterior pelvic tilt in standing   PALPATION: very tight in B thoracolumbar paraspinals and glutes; no particular TTP  LUMBAR ROM:   Active  A/PROM  eval  Flexion ankle  Extension 10% limited C/o "pop"  Right lateral flexion Jt line  Left lateral flexion Jt line  Right rotation 25% limited  L rotation 25% limited   (Blank rows = not tested)   LOWER EXTREMITY MMT:     MMT (in sitting) Right Eval Left Eval  Hip flexion 4+ 4  Hip extension    Hip abduction 4 4  Hip adduction 4 4  Hip internal rotation    Hip external rotation    Knee flexion 4+ 4-  Knee extension 4+ 4+  Ankle dorsiflexion 4+ 4+  Ankle plantarflexion 4+ 4+  Ankle inversion    Ankle eversion     (Blank rows = not tested)   GAIT: Gait pattern: L trunk lean and L>R hip drop Assistive device utilized: None Level of assistance: Complete Independence  FUNCTIONAL TESTS:  22M walk: 11.26 sec (2.91 ft/sec)   TODAY'S TREATMENT:                                                                                                                              DATE: 06/22/23    PATIENT EDUCATION: Education details: prognosis, POC, HEP Person educated: Patient Education method: Explanation, Demonstration, Tactile cues, Verbal cues, and Handouts Education comprehension: verbalized understanding and returned demonstration  HOME EXERCISE  PROGRAM: Access Code: [email or web address] URL: [email or web address] Date: 06/22/2023 Prepared by: [facility] - Outpatient  Rehab - [facility]  Exercises - Seated Flexion Stretch with Swiss Ball  - 1 x daily - 5 x weekly - 2 sets - 10 reps - 5 sec hold - Seated Pelvic Tilts  - 1 x daily - 5 x weekly - 2 sets - 10 reps - Supine Lower Trunk Rotation  - 1 x daily - 5 x weekly - 2 sets - 10 reps - Standing Hip Abduction with Counter Support  - 1 x daily - 5 x weekly - 2 sets - 10 reps  GOALS: Goals  reviewed with patient? Yes  SHORT TERM GOALS: Target date: 07/13/2023  Patient to be independent with initial HEP. Baseline: HEP initiated Goal status: INITIAL    LONG TERM GOALS: Target date: 08/03/2023  Patient to be independent with advanced HEP. Baseline: Not yet initiated  Goal status: INITIAL  Patient to demonstrate B LE strength >/=4+/5.  Baseline: See above Goal status: INITIAL  Patient to demonstrate lumbar AROM WFL and without pain limiting.  Baseline: see above Goal status: INITIAL  Patient to report at least 5 hours of sleep uninterrupted by pain per average night.  Baseline: reports 3 hours of sleep per night  Goal status: INITIAL  Patient to demonstrate 25% improvement in gait deviations.  Baseline:  L trunk lean and L>R hip drop Goal status: INITIAL    ASSESSMENT:  CLINICAL IMPRESSION:  Patient is a 52 y/o F presenting to OPPT with c/o chronic LBP, more recently now radiating to the L LE. Reports occasional tingling and burning (hx of neuropathy) but denies B&B changes or particular imbalance/falls. Pain is worst at night. Patient today presenting with Postural changes, tightness in posterior chain musculature, limited lumbar AROM, LE weakness, and gait deviations.  Patient was educated on gentle stretching and strengthening HEP and reported understanding. Would benefit from skilled PT services 2 x/week for 6 weeks to address aforementioned impairments in order to optimize level of function.    OBJECTIVE IMPAIRMENTS: Abnormal gait, decreased activity tolerance, decreased ROM, decreased strength, increased muscle spasms, impaired flexibility, improper body mechanics, postural dysfunction, and pain.   ACTIVITY LIMITATIONS: carrying, lifting, bending, standing, squatting, stairs, transfers, bed mobility, reach over head, and hygiene/grooming  PARTICIPATION LIMITATIONS: meal prep, cleaning, laundry, shopping, community activity, yard work, and church  PERSONAL  FACTORS: Age, Past/current experiences, Time since onset of injury/illness/exacerbation, and 3+ comorbidities: HA's, DM, HLD, neuropathy  are also affecting patient's functional outcome.   REHAB POTENTIAL: Good  CLINICAL DECISION MAKING: Evolving/moderate complexity  EVALUATION COMPLEXITY: Moderate  PLAN:  PT FREQUENCY: 2x/week  PT DURATION: 6 weeks  PLANNED INTERVENTIONS: 97164- PT Re-evaluation, 97110-Therapeutic exercises, 97530- Therapeutic activity, 97112- Neuromuscular re-education, 97535- Self Care, 16109- Manual therapy, [phone]- Gait training, [phone]- Canalith repositioning, [id number]- Aquatic Therapy, [phone]- Electrical stimulation (manual), Patient/Family education, Balance training, Stair training, Taping, Dry Needling, Joint mobilization, Spinal mobilization, Vestibular training, Cryotherapy, and Moist heat  PLAN FOR NEXT SESSION: assess hip mobility, review HEP, progress hip and core strengthening, lumbopelvic mobility      Anette Guarneri, PT, DPT 06/22/23 3:59 PM  [facility] at [facility] [address], [address] [place], [place] [address] Phone # [phone] Fax # [phone]

## 2023-06-22 ENCOUNTER — Encounter: Payer: Self-pay | Admitting: Physical Therapy

## 2023-06-22 ENCOUNTER — Other Ambulatory Visit: Payer: Self-pay

## 2023-06-22 ENCOUNTER — Ambulatory Visit: Payer: 59 | Attending: Internal Medicine | Admitting: Physical Therapy

## 2023-06-22 DIAGNOSIS — M6281 Muscle weakness (generalized): Secondary | ICD-10-CM | POA: Diagnosis present

## 2023-06-22 DIAGNOSIS — G6289 Other specified polyneuropathies: Secondary | ICD-10-CM | POA: Diagnosis not present

## 2023-06-22 DIAGNOSIS — M5459 Other low back pain: Secondary | ICD-10-CM | POA: Insufficient documentation

## 2023-06-22 DIAGNOSIS — R2689 Other abnormalities of gait and mobility: Secondary | ICD-10-CM | POA: Insufficient documentation

## 2023-06-29 ENCOUNTER — Ambulatory Visit: Payer: 59 | Attending: Internal Medicine | Admitting: Physical Therapy

## 2023-06-29 ENCOUNTER — Encounter: Payer: Self-pay | Admitting: Physical Therapy

## 2023-06-29 DIAGNOSIS — M5459 Other low back pain: Secondary | ICD-10-CM | POA: Diagnosis present

## 2023-06-29 DIAGNOSIS — R2689 Other abnormalities of gait and mobility: Secondary | ICD-10-CM | POA: Diagnosis present

## 2023-06-29 DIAGNOSIS — M6281 Muscle weakness (generalized): Secondary | ICD-10-CM

## 2023-06-29 NOTE — Therapy (Signed)
OUTPATIENT PHYSICAL THERAPY NEURO TREATMENT NOTE   Patient Name: Maureen Reyes MRN: [id number] DOB:[birth year], 52 y.o., female Today's Date: 07/01/2023   PCP: Olegario Messier, MD  REFERRING PROVIDER: Earl Lagos, MD   END OF SESSION:  PT End of Session - 07/01/23 1400     Visit Number 3    Number of Visits 13    Date for PT Re-Evaluation 08/03/23    Authorization Type UHC Medicare-Medicaid Dual Complete    Authorization - Visit Number 3    Authorization - Number of Visits 27    PT Start Time 1316    PT Stop Time 1354    PT Time Calculation (min) 38 min    Activity Tolerance Patient tolerated treatment well    Behavior During Therapy WFL for tasks assessed/performed               Past Medical History:  Diagnosis Date   Arthritis    Chronic headaches    Colon polyps    Diabetes mellitus without complication (HCC)    HLD (hyperlipidemia)    Hot flashes 02/29/2020   Lumbar facet arthropathy    Neuropathy    Prediabetes    Sciatica of right side    Sciatica of right side 05/04/2014   Past Surgical History:  Procedure Laterality Date   COLONOSCOPY  2023   [facility]   TUBAL LIGATION  1990 pt reported   Patient Active Problem List   Diagnosis Date Noted   Genetic testing 04/20/2023   Colon polyps    Mononeuropathy 10/14/2022   Positive FIT (fecal immunochemical test) 07/24/2021   Controlled type 2 diabetes mellitus (HCC) 07/03/2021   Hypercholesteremia 07/03/2021   Chronic lower back pain 11/26/2020   Subclinical hypothyroidism 04/19/2019   Ganglion cyst 04/19/2019   Obesity (BMI 30-39.9) 12/09/2017   Atopic dermatitis 02/18/2017   White coat syndrome with high blood pressure but without hypertension 02/18/2017   Polyneuropathy 11/28/2014   Lumbar facet arthropathy 05/04/2014   Healthcare maintenance 05/04/2014   Tobacco use disorder, mild, in sustained remission 05/04/2014    ONSET DATE: "a while"  REFERRING DIAG: G62.89 (ICD-10-CM) - Other  polyneuropathy  THERAPY DIAG:  Other low back pain  Muscle weakness (generalized)  Other abnormalities of gait and mobility  Rationale for Evaluation and Treatment: Rehabilitation  SUBJECTIVE:                                                                                                                                                                                             SUBJECTIVE STATEMENT: Nothing new.   Pt accompanied by: self  PERTINENT HISTORY: HA's,  DM, HLD, neuropathy  PAIN:  Are you having pain? Yes: NPRS scale: 2-3/10 Pain location: L>R LB Pain description: dull Aggravating factors: night time Relieving factors: massage, stretching her back, "be active" lower leg brace  PRECAUTIONS: None  RED FLAGS: None   WEIGHT BEARING RESTRICTIONS: No  FALLS: Has patient fallen in last 6 months? 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 Reports pain in LB with anterior pelvic tilt   Bridge 10x  Cues for core contraction   bridge green TB 10x  Good form  Deadbug 10x  Initial difficulty coordinating limbs; cues to engage core  prone on elbows 10x  Reports "just a stretch"  Supine DKTC  3x10" "That doesn't hurt at all"          HOME EXERCISE PROGRAM Last updated: 07/01/23 Access Code: [email or web address] URL: [email or web address] Date: 07/01/2023 Prepared by: [facility] - Outpatient  Rehab - [facility]  Exercises - Seated Flexion Stretch with Swiss Ball  - 1 x daily - 5 x weekly - 2 sets - 10 reps - 5 sec hold - Seated Pelvic Tilts  - 1 x daily - 5 x weekly - 2 sets - 10 reps - Supine Lower Trunk Rotation  - 1 x daily - 5 x weekly - 2 sets - 10 reps - Standing Hip Abduction with Counter Support  - 1 x daily - 5 x weekly - 2 sets - 10 reps - Supine Piriformis Stretch with Foot on Ground  - 1 x daily - 7 x weekly - 1 sets - 3-5 reps - 15-30 sec hold - Supine Posterior Pelvic Tilt  - 1 x daily - 7 x weekly - 1 sets - 10 reps - Supine Bridge with Resistance Band  - 1 x daily - 5 x weekly - 2 sets - 10 reps   PATIENT EDUCATION: Education details: discussion on sleeping positions for improved comfort and recommended leg elevation bolster as she reports comfort using the clinic bolster; HEP update, edu on spinal directional preference (slight preference for flexion) Person educated: Patient Education method: Explanation, Demonstration, Tactile cues, Verbal cues, and Handouts Education comprehension: verbalized understanding and returned demonstration    ------------------------------------------------- Note: Objective measures below were completed at Evaluation unless otherwise noted.  DIAGNOSTIC FINDINGS: none recent  COGNITION: Overall cognitive status: Within functional limits for tasks assessed   SENSATION: Intact to light touch in B LEs   POSTURE:  anterior pelvic tilt in standing   PALPATION: very tight in B thoracolumbar paraspinals and glutes; no particular TTP  LUMBAR ROM:   Active  A/PROM  eval  Flexion ankle  Extension 10% limited C/o "pop"  Right lateral flexion Jt line  Left lateral flexion Jt line  Right rotation 25% limited  L  rotation 25% limited   (Blank rows = not tested)   LOWER EXTREMITY MMT:     MMT (in sitting) Right Eval Left Eval  Hip flexion 4+ 4  Hip extension    Hip abduction 4 4  Hip adduction 4 4  Hip internal rotation    Hip external rotation    Knee flexion 4+ 4-  Knee extension 4+ 4+  Ankle dorsiflexion 4+ 4+  Ankle plantarflexion 4+ 4+  Ankle inversion    Ankle eversion     (Blank rows = not tested)   GAIT: Gait pattern: L trunk lean and L>R hip drop Assistive device utilized: None Level of assistance: Complete Independence  FUNCTIONAL TESTS:  3M walk: 11.26 sec (2.91 ft/sec)   TODAY'S TREATMENT:                                                                                                                              DATE: 06/22/23    PATIENT EDUCATION: Education details: prognosis, POC, HEP Person educated: Patient Education method: Explanation, Demonstration, Tactile cues, Verbal cues, and Handouts Education comprehension: verbalized understanding and returned demonstration  HOME EXERCISE PROGRAM: Access Code: [email or web address] URL: [email or web address] Date: 06/22/2023 Prepared by: [facility] - Outpatient  Rehab - [facility]  Exercises - Seated Flexion Stretch with Swiss Ball  - 1 x daily - 5 x weekly - 2 sets - 10 reps - 5 sec hold - Seated Pelvic Tilts  - 1 x daily - 5 x weekly - 2 sets - 10 reps - Supine Lower Trunk Rotation  - 1 x daily - 5 x weekly - 2 sets - 10 reps - Standing Hip Abduction with Counter Support  - 1 x daily - 5 x weekly - 2 sets - 10 reps  GOALS: Goals reviewed with patient? Yes  SHORT TERM GOALS: Target date: 07/13/2023  Patient to be independent with initial HEP. Baseline: HEP initiated Goal status: IN PROGRESS    LONG TERM GOALS: Target date: 08/03/2023  Patient to be independent with advanced HEP. Baseline: Not yet initiated  Goal status: IN PROGRESS  Patient to demonstrate B LE strength >/=4+/5.  Baseline:  See above Goal status: IN PROGRESS  Patient to demonstrate lumbar AROM WFL and without pain limiting.  Baseline: see above Goal status: IN PROGRESS  Patient to report at least 5 hours of sleep uninterrupted by pain per average night.  Baseline: reports 3 hours of sleep per night  Goal status: IN PROGRESS  Patient to demonstrate 25% improvement in gait deviations.  Baseline:  L trunk lean and L>R hip drop Goal status: IN PROGRESS    ASSESSMENT:  CLINICAL IMPRESSION: Patient arrived to session without new complaints. Hip AROM revealed some discomfort in rotation, more-so on L than R side. Reviewed HEP updated  and progressed glute strengthening which was tolerated well. Discussed sleeping positions, use of bolster, and stretches to relieve pain as patient seems to have slight directional preference for flexion. Patient reported back pain decreased to 1/10 upon leaving.   OBJECTIVE IMPAIRMENTS: Abnormal gait, decreased activity tolerance, decreased ROM, decreased strength, increased muscle spasms, impaired flexibility, improper body mechanics, postural dysfunction, and pain.   ACTIVITY LIMITATIONS: carrying, lifting, bending, standing, squatting, stairs, transfers, bed mobility, reach over head, and hygiene/grooming  PARTICIPATION LIMITATIONS: meal prep, cleaning, laundry, shopping, community activity, yard work, and church  PERSONAL FACTORS: Age, Past/current experiences, Time since onset of injury/illness/exacerbation, and 3+ comorbidities: HA's, DM, HLD, neuropathy  are also affecting patient's functional outcome.   REHAB POTENTIAL: Good  CLINICAL DECISION MAKING: Evolving/moderate complexity  EVALUATION COMPLEXITY: Moderate  PLAN:  PT FREQUENCY: 2x/week  PT DURATION: 6 weeks  PLANNED INTERVENTIONS: 97164- PT Re-evaluation, 97110-Therapeutic exercises, 97530- Therapeutic activity, 97112- Neuromuscular re-education, 97535- Self Care, 78469- Manual therapy, [phone]- Gait  training, [phone]- Canalith repositioning, [id number]- Aquatic Therapy, [phone]- Electrical stimulation (manual), Patient/Family education, Balance training, Stair training, Taping, Dry Needling, Joint mobilization, Spinal mobilization, Vestibular training, Cryotherapy, and Moist heat  PLAN FOR NEXT SESSION:  review HEP additions, progress hip and core strengthening, lumbopelvic mobility    Anette Guarneri, PT, DPT 07/01/23 2:05 PM  [facility] [address] [place], [place] [address] Phone # [phone] Fax # [phone]

## 2023-06-29 NOTE — Therapy (Signed)
OUTPATIENT PHYSICAL THERAPY NEURO TREATMENT NOTE   Patient Name: Maureen Reyes MRN: [id number] DOB:[birth year], 52 y.o., female Today's Date: 06/29/2023   PCP: Olegario Messier, MD  REFERRING PROVIDER: Earl Lagos, MD   END OF SESSION:  PT End of Session - 06/29/23 1321     Visit Number 2    Number of Visits 13    Date for PT Re-Evaluation 08/03/23    Authorization Type UHC Medicare-Medicaid Dual Complete    Authorization - Visit Number 2    Authorization - Number of Visits 27    PT Start Time 1319    PT Stop Time 1357    PT Time Calculation (min) 38 min    Activity Tolerance Patient tolerated treatment well;No increased pain   rates pain 3/10 at end of session   Behavior During Therapy [facility] for tasks assessed/performed              Past Medical History:  Diagnosis Date   Arthritis    Chronic headaches    Colon polyps    Diabetes mellitus without complication (HCC)    HLD (hyperlipidemia)    Hot flashes 02/29/2020   Lumbar facet arthropathy    Neuropathy    Prediabetes    Sciatica of right side    Sciatica of right side 05/04/2014   Past Surgical History:  Procedure Laterality Date   COLONOSCOPY  2023   [facility]   TUBAL LIGATION  1990 pt reported   Patient Active Problem List   Diagnosis Date Noted   Genetic testing 04/20/2023   Colon polyps    Mononeuropathy 10/14/2022   Positive FIT (fecal immunochemical test) 07/24/2021   Controlled type 2 diabetes mellitus (HCC) 07/03/2021   Hypercholesteremia 07/03/2021   Chronic lower back pain 11/26/2020   Subclinical hypothyroidism 04/19/2019   Ganglion cyst 04/19/2019   Obesity (BMI 30-39.9) 12/09/2017   Atopic dermatitis 02/18/2017   White coat syndrome with high blood pressure but without hypertension 02/18/2017   Polyneuropathy 11/28/2014   Lumbar facet arthropathy 05/04/2014   Healthcare maintenance 05/04/2014   Tobacco use disorder, mild, in sustained remission 05/04/2014    ONSET DATE: "a  while"  REFERRING DIAG: G62.89 (ICD-10-CM) - Other polyneuropathy  THERAPY DIAG:  Other low back pain  Muscle weakness (generalized)  Other abnormalities of gait and mobility  Rationale for Evaluation and Treatment: Rehabilitation  SUBJECTIVE:                                                                                                                                                                                             SUBJECTIVE STATEMENT: Sleep deprived because  of the pain.  It's in the low back and down the leg.  Sleeping on the side is very uncomfortable.  Pt accompanied by: self  PERTINENT HISTORY: HA's, DM, HLD, neuropathy  PAIN:  Are you having pain? Yes: NPRS scale: 5/10 Pain location: L LB Pain description: dull Aggravating factors: night time Relieving factors: massage, stretching her back, "be active" lower leg brace  PRECAUTIONS: None  RED FLAGS: None   WEIGHT BEARING RESTRICTIONS: No  FALLS: Has patient fallen in last 6 months? 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, agricultural  PATIENT GOALS: improve pain   OBJECTIVE:   TODAY'S TREATMENT: 06/29/2023 Activity Comments  Reviewed HEP: Seated pelvic tilts Supine lower trunk rotation Minimal cues for technique  Supine pelvic tilts, 10 reps Increased pain in ant pelvic tilt  Neutral to posterior pelvic tilts, 5 reps No pain  Knee to opposite shoulder stretch, for gentle piriformis stretch, 2 x 15 sec  Gentle stretch, no pain  Log roll to sit   Sitting core stabilization: Alt UE lifts, 2 x 5 reps BUE lifts 2 x 5 Alt march sitting, 2 x 5 reps Opposite arm/leg lifts 2 x 5 Neutral spine and abdominal activation  Short distance gait with cues for posture, abdominal  activation Minimal c/o pain in legs    Rates pain at end of session as 3/10  PATIENT EDUCATION: Education details: HEP additions, neutral pelvis position, log roll, finding exercises that centralize/lessen pain Person educated: Patient Education method: Explanation, Demonstration, and Handouts Education comprehension: verbalized understanding, returned demonstration, and needs further education  Access Code: [email or web address] URL: [email or web address] Date: 06/29/2023 Prepared by: [facility] - Outpatient  Rehab - [facility]  Exercises - Seated Flexion Stretch with Swiss Ball  - 1 x daily - 5 x weekly - 2 sets - 10 reps - 5 sec hold - Seated Pelvic Tilts  - 1 x daily - 5 x weekly - 2 sets - 10 reps - Supine Lower Trunk Rotation  - 1 x daily - 5 x weekly - 2 sets - 10 reps - Standing Hip Abduction with Counter Support  - 1 x daily - 5 x weekly - 2 sets - 10 reps - Supine Piriformis Stretch with Foot on Ground  - 1 x daily - 7 x weekly - 1 sets - 3-5 reps - 15-30 sec hold - Supine Posterior Pelvic Tilt  - 1 x daily - 7 x weekly - 1 sets - 10 reps ------------------------------------------------- Note: Objective measures below were completed at Evaluation unless otherwise noted.  DIAGNOSTIC FINDINGS: none recent  COGNITION: Overall cognitive status: Within functional limits for tasks assessed   SENSATION: Intact to light touch in B LEs   POSTURE:  anterior pelvic tilt in standing   PALPATION: very tight in B thoracolumbar paraspinals and glutes; no particular TTP  LUMBAR ROM:   Active  A/PROM  eval  Flexion ankle  Extension 10% limited C/o "pop"  Right lateral flexion Jt line  Left lateral flexion Jt line  Right rotation 25% limited  L rotation 25% limited   (Blank rows = not tested)   LOWER EXTREMITY MMT:     MMT (in sitting) Right Eval Left Eval  Hip flexion 4+ 4  Hip extension  Hip abduction 4 4  Hip adduction 4 4  Hip internal rotation    Hip  external rotation    Knee flexion 4+ 4-  Knee extension 4+ 4+  Ankle dorsiflexion 4+ 4+  Ankle plantarflexion 4+ 4+  Ankle inversion    Ankle eversion     (Blank rows = not tested)   GAIT: Gait pattern: L trunk lean and L>R hip drop Assistive device utilized: None Level of assistance: Complete Independence  FUNCTIONAL TESTS:  69M walk: 11.26 sec (2.91 ft/sec)   TODAY'S TREATMENT:                                                                                                                              DATE: 06/22/23    PATIENT EDUCATION: Education details: prognosis, POC, HEP Person educated: Patient Education method: Explanation, Demonstration, Tactile cues, Verbal cues, and Handouts Education comprehension: verbalized understanding and returned demonstration  HOME EXERCISE PROGRAM: Access Code: [email or web address] URL: [email or web address] Date: 06/22/2023 Prepared by: [facility] - Outpatient  Rehab - [facility]  Exercises - Seated Flexion Stretch with Swiss Ball  - 1 x daily - 5 x weekly - 2 sets - 10 reps - 5 sec hold - Seated Pelvic Tilts  - 1 x daily - 5 x weekly - 2 sets - 10 reps - Supine Lower Trunk Rotation  - 1 x daily - 5 x weekly - 2 sets - 10 reps - Standing Hip Abduction with Counter Support  - 1 x daily - 5 x weekly - 2 sets - 10 reps  GOALS: Goals reviewed with patient? Yes  SHORT TERM GOALS: Target date: 07/13/2023  Patient to be independent with initial HEP. Baseline: HEP initiated Goal status: IN PROGRESS    LONG TERM GOALS: Target date: 08/03/2023  Patient to be independent with advanced HEP. Baseline: Not yet initiated  Goal status: IN PROGRESS  Patient to demonstrate B LE strength >/=4+/5.  Baseline: See above Goal status: IN PROGRESS  Patient to demonstrate lumbar AROM WFL and without pain limiting.  Baseline: see above Goal status: IN PROGRESS  Patient to report at least 5 hours of sleep uninterrupted by pain per  average night.  Baseline: reports 3 hours of sleep per night  Goal status: IN PROGRESS  Patient to demonstrate 25% improvement in gait deviations.  Baseline:  L trunk lean and L>R hip drop Goal status: IN PROGRESS    ASSESSMENT:  CLINICAL IMPRESSION: Skilled PT session today focused on lumbar flexibility and core stability.  Reviewed seated pelvic tilts and supine trunk rotation, and pt needs cues to lessen motion to not bring on pain.  Generally with seated lumbar extension and supine lumbar anterior pelvic tilts, pt notes increased pain.  With work in neutral position to posterior pelvic tilt as well as addition of piriformis stretch, pt reports decreased pain, more centralized to low back (started session today in L buttocks).  Worked in seated pelvic neutral position  with abdominal activation activities for core stabilization, again, with pt reporting overall decrease in pain.  Emphasized pt moving in motions at home that lessen pain or centralize pain and avoid repeated motions that increase or radiate pain.  She will continue to benefit from skilled PT towards goals for improved pain and improved functional mobility.  OBJECTIVE IMPAIRMENTS: Abnormal gait, decreased activity tolerance, decreased ROM, decreased strength, increased muscle spasms, impaired flexibility, improper body mechanics, postural dysfunction, and pain.   ACTIVITY LIMITATIONS: carrying, lifting, bending, standing, squatting, stairs, transfers, bed mobility, reach over head, and hygiene/grooming  PARTICIPATION LIMITATIONS: meal prep, cleaning, laundry, shopping, community activity, yard work, and church  PERSONAL FACTORS: Age, Past/current experiences, Time since onset of injury/illness/exacerbation, and 3+ comorbidities: HA's, DM, HLD, neuropathy  are also affecting patient's functional outcome.   REHAB POTENTIAL: Good  CLINICAL DECISION MAKING: Evolving/moderate complexity  EVALUATION COMPLEXITY:  Moderate  PLAN:  PT FREQUENCY: 2x/week  PT DURATION: 6 weeks  PLANNED INTERVENTIONS: 97164- PT Re-evaluation, 97110-Therapeutic exercises, 97530- Therapeutic activity, 97112- Neuromuscular re-education, 97535- Self Care, 62130- Manual therapy, [phone]- Gait training, [phone]- Canalith repositioning, [id number]- Aquatic Therapy, [phone]- Electrical stimulation (manual), Patient/Family education, Balance training, Stair training, Taping, Dry Needling, Joint mobilization, Spinal mobilization, Vestibular training, Cryotherapy, and Moist heat  PLAN FOR NEXT SESSION: assess hip mobility, review HEP additions, progress hip and core strengthening, lumbopelvic mobility      Lonia Blood, PT 06/29/23 2:02 PM Phone: [phone] Fax: [phone]  [facility] at [facility] [address], [address] [place], [place] [address] Phone # [phone] Fax # [phone]

## 2023-06-30 ENCOUNTER — Other Ambulatory Visit: Payer: Self-pay | Admitting: Student

## 2023-06-30 ENCOUNTER — Other Ambulatory Visit (HOSPITAL_COMMUNITY): Payer: Self-pay

## 2023-06-30 ENCOUNTER — Encounter (HOSPITAL_COMMUNITY): Payer: Self-pay

## 2023-06-30 DIAGNOSIS — E119 Type 2 diabetes mellitus without complications: Secondary | ICD-10-CM

## 2023-07-01 ENCOUNTER — Other Ambulatory Visit: Payer: Self-pay | Admitting: Student

## 2023-07-01 ENCOUNTER — Other Ambulatory Visit: Payer: Self-pay

## 2023-07-01 ENCOUNTER — Ambulatory Visit: Payer: 59 | Admitting: Physical Therapy

## 2023-07-01 ENCOUNTER — Encounter: Payer: Self-pay | Admitting: Physical Therapy

## 2023-07-01 ENCOUNTER — Other Ambulatory Visit (HOSPITAL_COMMUNITY): Payer: Self-pay

## 2023-07-01 DIAGNOSIS — R2689 Other abnormalities of gait and mobility: Secondary | ICD-10-CM

## 2023-07-01 DIAGNOSIS — M5459 Other low back pain: Secondary | ICD-10-CM

## 2023-07-01 DIAGNOSIS — E119 Type 2 diabetes mellitus without complications: Secondary | ICD-10-CM

## 2023-07-01 DIAGNOSIS — M6281 Muscle weakness (generalized): Secondary | ICD-10-CM

## 2023-07-01 MED ORDER — GLUCOSE BLOOD VI STRP
ORAL_STRIP | 12 refills | Status: AC
  Filled 2023-07-01: qty 100, 90d supply, fill #0
  Filled 2023-11-05: qty 100, 90d supply, fill #1
  Filled 2024-02-11: qty 100, 90d supply, fill #2
  Filled 2024-05-09: qty 100, 90d supply, fill #3

## 2023-07-01 MED ORDER — OZEMPIC (0.25 OR 0.5 MG/DOSE) 2 MG/3ML ~~LOC~~ SOPN
0.2500 mg | PEN_INJECTOR | SUBCUTANEOUS | 2 refills | Status: DC
  Filled 2023-07-01: qty 3, 28d supply, fill #0
  Filled 2023-07-30: qty 3, 28d supply, fill #1
  Filled 2023-08-27: qty 3, 28d supply, fill #2

## 2023-07-01 NOTE — Telephone Encounter (Signed)
Medication sent to pharmacy  

## 2023-07-01 NOTE — Telephone Encounter (Signed)
Patient called she is requesting accu chek test strips I called and spoke with the pharmacy they are requesting a new rx.

## 2023-07-02 ENCOUNTER — Other Ambulatory Visit (HOSPITAL_COMMUNITY): Payer: Self-pay

## 2023-07-06 ENCOUNTER — Other Ambulatory Visit (HOSPITAL_COMMUNITY): Payer: Self-pay

## 2023-07-06 ENCOUNTER — Ambulatory Visit: Payer: 59 | Admitting: Physical Therapy

## 2023-07-06 ENCOUNTER — Encounter: Payer: Self-pay | Admitting: Physical Therapy

## 2023-07-06 DIAGNOSIS — M5459 Other low back pain: Secondary | ICD-10-CM

## 2023-07-06 DIAGNOSIS — R2689 Other abnormalities of gait and mobility: Secondary | ICD-10-CM

## 2023-07-06 DIAGNOSIS — M6281 Muscle weakness (generalized): Secondary | ICD-10-CM

## 2023-07-06 NOTE — Therapy (Signed)
OUTPATIENT PHYSICAL THERAPY NEURO TREATMENT NOTE   Patient Name: Maureen Reyes MRN: [id number] DOB:1971/05/21, 52 y.o., female Today's Date: 07/06/2023   PCP: Olegario Messier, MD  REFERRING PROVIDER: Earl Lagos, MD   END OF SESSION:  PT End of Session - 07/06/23 1316     Visit Number 4    Number of Visits 13    Date for PT Re-Evaluation 08/03/23    Authorization Type UHC Medicare-Medicaid Dual Complete    Authorization - Visit Number 4    Authorization - Number of Visits 27    PT Start Time 1316    PT Stop Time 1359    PT Time Calculation (min) 43 min    Activity Tolerance Patient tolerated treatment well    Behavior During Therapy WFL for tasks assessed/performed                Past Medical History:  Diagnosis Date   Arthritis    Chronic headaches    Colon polyps    Diabetes mellitus without complication (HCC)    HLD (hyperlipidemia)    Hot flashes 02/29/2020   Lumbar facet arthropathy    Neuropathy    Prediabetes    Sciatica of right side    Sciatica of right side 05/04/2014   Past Surgical History:  Procedure Laterality Date   COLONOSCOPY  2023   [facility]   TUBAL LIGATION  1990 pt reported   Patient Active Problem List   Diagnosis Date Noted   Genetic testing 04/20/2023   Colon polyps    Mononeuropathy 10/14/2022   Positive FIT (fecal immunochemical test) 07/24/2021   Controlled type 2 diabetes mellitus (HCC) 07/03/2021   Hypercholesteremia 07/03/2021   Chronic lower back pain 11/26/2020   Subclinical hypothyroidism 04/19/2019   Ganglion cyst 04/19/2019   Obesity (BMI 30-39.9) 12/09/2017   Atopic dermatitis 02/18/2017   White coat syndrome with high blood pressure but without hypertension 02/18/2017   Polyneuropathy 11/28/2014   Lumbar facet arthropathy 05/04/2014   Healthcare maintenance 05/04/2014   Tobacco use disorder, mild, in sustained remission 05/04/2014    ONSET DATE: "a while"  REFERRING DIAG: G62.89 (ICD-10-CM) -  Other polyneuropathy  THERAPY DIAG:  Other low back pain  Muscle weakness (generalized)  Other abnormalities of gait and mobility  Rationale for Evaluation and Treatment: Rehabilitation  SUBJECTIVE:                                                                                                                                                                                             SUBJECTIVE STATEMENT: No matter which side I lie on, my arms/hand would be  numb.  Did have nerve pain go all the way up my R leg, then went through both legs.  Having some pain in arms and hands-not sure if that is arthritis  Pt accompanied by: self  PERTINENT HISTORY: HA's, DM, HLD, neuropathy  PAIN:  Are you having pain? Yes: NPRS scale: 4/10 Pain location: back and both legs Pain description: dull Aggravating factors: night time Relieving factors: massage, stretching her back, change positions  PRECAUTIONS: None  RED FLAGS: None   WEIGHT BEARING RESTRICTIONS: No  FALLS: Has patient fallen in last 6 months? 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, agricultural  PATIENT GOALS: improve pain   OBJECTIVE:    TODAY'S TREATMENT: 07/06/2023 Activity Comments  Reviewed piriformis stretch, 2 x 30 sec Supine pelvic tilt to neutral Cues to avoid strain and lessen to comfortable stretch Posterior pelvic tilt, no pain; feels it with attempts at anterior pelvic tilt  Reviewed bridge with theraband resistance Needs initial reminder cues for technique, no c/o pain  Supine deadbug exercise Difficulty coordinating movements  Hooklying isometric hip flexion 5 reps each side Pt reports strain through arms  DTKC 3 x 10" Relaxing position  Reports same pain in bilateral front of  quads and in low back 5/10  Sittting knee flexion 3 reps reaching towards floor,  No pain  Standing lumbar flexion stretch, 3 reps   Standing modified quadruped hip extension 5 reps Cues to avoid guarding through upper body  Sit to stand with band at knees 5 reps, then 3 additional reps with cues to lessen lumbar lordosis with core stabilization     HOME EXERCISE PROGRAM Last updated: 07/01/23 Access Code: [email or web address] URL: [email or web address] Date: 07/01/2023 Prepared by: [facility] - Outpatient  Rehab - [facility]  Exercises - Seated Flexion Stretch with Swiss Ball  - 1 x daily - 5 x weekly - 2 sets - 10 reps - 5 sec hold - Seated Pelvic Tilts  - 1 x daily - 5 x weekly - 2 sets - 10 reps - Supine Lower Trunk Rotation  - 1 x daily - 5 x weekly - 2 sets - 10 reps - Standing Hip Abduction with Counter Support  - 1 x daily - 5 x weekly - 2 sets - 10 reps - Supine Piriformis Stretch with Foot on Ground  - 1 x daily - 7 x weekly - 1 sets - 3-5 reps - 15-30 sec hold - Supine Posterior Pelvic Tilt  - 1 x daily - 7 x weekly - 1 sets - 10 reps - Supine Bridge with Resistance Band  - 1 x daily - 5 x weekly - 2 sets - 10 reps   PATIENT EDUCATION: Education details: Continue current HEP; reviewed discussion on sleeping positions for improved comfort and recommended leg elevation bolster as she reports comfort using the clinic bolster; reviewed education on spinal directional preference (slight preference for flexion) Person educated: Patient Education method: Explanation, Demonstration, Tactile cues, Verbal cues, and Handouts Education comprehension: verbalized understanding and returned demonstration    ------------------------------------------------- Note: Objective measures below were completed at Evaluation unless otherwise noted.  DIAGNOSTIC FINDINGS: none recent  COGNITION: Overall cognitive status: Within functional limits for tasks assessed   SENSATION: Intact  to light touch  in B LEs   POSTURE:  anterior pelvic tilt in standing   PALPATION: very tight in B thoracolumbar paraspinals and glutes; no particular TTP  LUMBAR ROM:   Active  A/PROM  eval  Flexion ankle  Extension 10% limited C/o "pop"  Right lateral flexion Jt line  Left lateral flexion Jt line  Right rotation 25% limited  L rotation 25% limited   (Blank rows = not tested)   LOWER EXTREMITY MMT:     MMT (in sitting) Right Eval Left Eval  Hip flexion 4+ 4  Hip extension    Hip abduction 4 4  Hip adduction 4 4  Hip internal rotation    Hip external rotation    Knee flexion 4+ 4-  Knee extension 4+ 4+  Ankle dorsiflexion 4+ 4+  Ankle plantarflexion 4+ 4+  Ankle inversion    Ankle eversion     (Blank rows = not tested)   GAIT: Gait pattern: L trunk lean and L>R hip drop Assistive device utilized: None Level of assistance: Complete Independence  FUNCTIONAL TESTS:  70M walk: 11.26 sec (2.91 ft/sec)   TODAY'S TREATMENT:                                                                                                                              DATE: 06/22/23    PATIENT EDUCATION: Education details: prognosis, POC, HEP Person educated: Patient Education method: Explanation, Demonstration, Tactile cues, Verbal cues, and Handouts Education comprehension: verbalized understanding and returned demonstration  HOME EXERCISE PROGRAM: Access Code: [email or web address] URL: [email or web address] Date: 06/22/2023 Prepared by: [facility] - Outpatient  Rehab - [facility]  Exercises - Seated Flexion Stretch with Swiss Ball  - 1 x daily - 5 x weekly - 2 sets - 10 reps - 5 sec hold - Seated Pelvic Tilts  - 1 x daily - 5 x weekly - 2 sets - 10 reps - Supine Lower Trunk Rotation  - 1 x daily - 5 x weekly - 2 sets - 10 reps - Standing Hip Abduction with Counter Support  - 1 x daily - 5 x weekly - 2 sets - 10 reps  GOALS: Goals reviewed with patient? Yes  SHORT  TERM GOALS: Target date: 07/13/2023  Patient to be independent with initial HEP. Baseline: HEP initiated Goal status: IN PROGRESS    LONG TERM GOALS: Target date: 08/03/2023  Patient to be independent with advanced HEP. Baseline: Not yet initiated  Goal status: IN PROGRESS  Patient to demonstrate B LE strength >/=4+/5.  Baseline: See above Goal status: IN PROGRESS  Patient to demonstrate lumbar AROM WFL and without pain limiting.  Baseline: see above Goal status: IN PROGRESS  Patient to report at least 5 hours of sleep uninterrupted by pain per average night.  Baseline: reports 3 hours of sleep per night  Goal status: IN PROGRESS  Patient to demonstrate 25% improvement in gait deviations.  Baseline:  L trunk lean and L>R  hip drop Goal status: IN PROGRESS    ASSESSMENT:  CLINICAL IMPRESSION: Pt arrives to session today with reports of BLE nerve pain over the weekend, as well as some numbness in her arms/hands while sleeping on her sides.  She does not report a hx of neck pain.  Reviewed pt's HEP additions from last visit and pt does need cues to avoid strain through BUEs.  In addition, while working with sit to stand and standing, she does appear to stand in excess lumbar lordosis/ant pelvic tilt position.  Educated pt in abdominal activation to promote more neutral spine while standing.  Pt does not report any worsening pain or symptoms in response to exercise in PT session, but she notes pain is about the same from beginning to end of session.  Will continue to monitor and progress exercises as pt tolerates.    OBJECTIVE IMPAIRMENTS: Abnormal gait, decreased activity tolerance, decreased ROM, decreased strength, increased muscle spasms, impaired flexibility, improper body mechanics, postural dysfunction, and pain.   ACTIVITY LIMITATIONS: carrying, lifting, bending, standing, squatting, stairs, transfers, bed mobility, reach over head, and hygiene/grooming  PARTICIPATION  LIMITATIONS: meal prep, cleaning, laundry, shopping, community activity, yard work, and church  PERSONAL FACTORS: Age, Past/current experiences, Time since onset of injury/illness/exacerbation, and 3+ comorbidities: HA's, DM, HLD, neuropathy  are also affecting patient's functional outcome.   REHAB POTENTIAL: Good  CLINICAL DECISION MAKING: Evolving/moderate complexity  EVALUATION COMPLEXITY: Moderate  PLAN:  PT FREQUENCY: 2x/week  PT DURATION: 6 weeks  PLANNED INTERVENTIONS: 97164- PT Re-evaluation, 97110-Therapeutic exercises, 97530- Therapeutic activity, [id number]- Neuromuscular re-education, 97535- Self Care, 16109- Manual therapy, [id number]- Gait training, [phone]- Canalith repositioning, [id number]- Aquatic Therapy, [phone]- Electrical stimulation (manual), Patient/Family education, Balance training, Stair training, Taping, Dry Needling, Joint mobilization, Spinal mobilization, Vestibular training, Cryotherapy, and Moist heat  PLAN FOR NEXT SESSION:  Ask about pt's newly reported arm and hand numbness with side sleeping; continue to progress hip and core strengthening, lumbopelvic mobility towards goals.   Lonia Blood, PT 07/06/23 2:08 PM Phone: [phone] Fax: [phone]   [facility] at [facility] [address] [place], [address] [place], [place] [address] Phone # [phone] Fax # [phone]

## 2023-07-08 NOTE — Therapy (Signed)
OUTPATIENT PHYSICAL THERAPY NEURO TREATMENT NOTE   Patient Name: Maureen Reyes MRN: [id number] DOB:[birth year], 52 y.o., female Today's Date: 07/09/2023   PCP: Olegario Messier, MD  REFERRING PROVIDER: Earl Lagos, MD   END OF SESSION:  PT End of Session - 07/09/23 1442     Visit Number 5    Number of Visits 13    Date for PT Re-Evaluation 08/03/23    Authorization Type UHC Medicare-Medicaid Dual Complete    Authorization - Visit Number 5    Authorization - Number of Visits 27    PT Start Time 1406    PT Stop Time 1445    PT Time Calculation (min) 39 min    Activity Tolerance Patient tolerated treatment well    Behavior During Therapy WFL for tasks assessed/performed                 Past Medical History:  Diagnosis Date   Arthritis    Chronic headaches    Colon polyps    Diabetes mellitus without complication (HCC)    HLD (hyperlipidemia)    Hot flashes 02/29/2020   Lumbar facet arthropathy    Neuropathy    Prediabetes    Sciatica of right side    Sciatica of right side 05/04/2014   Past Surgical History:  Procedure Laterality Date   COLONOSCOPY  2023   [facility]   TUBAL LIGATION  1990 pt reported   Patient Active Problem List   Diagnosis Date Noted   Genetic testing 04/20/2023   Colon polyps    Mononeuropathy 10/14/2022   Positive FIT (fecal immunochemical test) 07/24/2021   Controlled type 2 diabetes mellitus (HCC) 07/03/2021   Hypercholesteremia 07/03/2021   Chronic lower back pain 11/26/2020   Subclinical hypothyroidism 04/19/2019   Ganglion cyst 04/19/2019   Obesity (BMI 30-39.9) 12/09/2017   Atopic dermatitis 02/18/2017   White coat syndrome with high blood pressure but without hypertension 02/18/2017   Polyneuropathy 11/28/2014   Lumbar facet arthropathy 05/04/2014   Healthcare maintenance 05/04/2014   Tobacco use disorder, mild, in sustained remission 05/04/2014    ONSET DATE: "a while"  REFERRING DIAG: G62.89 (ICD-10-CM) -  Other polyneuropathy  THERAPY DIAG:  Other low back pain  Muscle weakness (generalized)  Other abnormalities of gait and mobility  Rationale for Evaluation and Treatment: Rehabilitation  SUBJECTIVE:                                                                                                                                                                                             SUBJECTIVE STATEMENT: No longer waking up with N/T in her arms.  Pt accompanied by: self  PERTINENT HISTORY: HA's, DM, HLD, neuropathy  PAIN:  Are you having pain? Yes: NPRS scale: 2-3/10 Pain location: back and both legs Pain description: dull Aggravating factors: night time Relieving factors: massage, stretching her back, change positions  PRECAUTIONS: None  RED FLAGS: None   WEIGHT BEARING RESTRICTIONS: No  FALLS: Has patient fallen in last 6 months? 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, agricultural  PATIENT GOALS: improve pain   OBJECTIVE:    TODAY'S TREATMENT: 07/09/23 Activity Comments  nustep L4 x 6 min UEs/LEs    standing march #2 2x20  At counter; good form  standing hip ABD #2 2x10 each At counter: cues to correct anterior and lateral trunk lean   standing hip EX #2 2x10 each  At counter: cues to correct anterior trunk lean   mini squats 2x10 At counter: cues to prevent forward trunk lean   Open book stretch 10x each side To tolerance; good tolerance and form   standing lumbar flexion at counter 3x10" Good tolerance   cat/cow Manual cues to coordinate head movements with pelvic tilts for gentle cervical stretch   child's pose with pillow under bottom Did not tolerate d/t knee discomfort  Sitting prayer stretch with pball 10x5" Better tolerance        HOME EXERCISE PROGRAM Last updated: 07/01/23 Access Code: [email or web address] URL: [email or web address] Date: 07/01/2023 Prepared by: [facility] - Outpatient  Rehab - [facility]  Exercises - Seated Flexion Stretch with Swiss Ball  - 1 x daily - 5 x weekly - 2 sets - 10 reps - 5 sec hold - Seated Pelvic Tilts  - 1 x daily - 5 x weekly - 2 sets - 10 reps - Supine Lower Trunk Rotation  - 1 x daily - 5 x weekly - 2 sets - 10 reps - Standing Hip Abduction with Counter Support  - 1 x daily - 5 x weekly - 2 sets - 10 reps - Supine Piriformis Stretch with Foot on Ground  - 1 x daily - 7 x weekly - 1 sets - 3-5 reps - 15-30 sec hold - Supine Posterior Pelvic Tilt  - 1 x daily - 7 x weekly - 1 sets - 10 reps - Supine Bridge with Resistance Band  - 1 x daily - 5 x weekly - 2 sets - 10 reps    ------------------------------------------------- Note: Objective measures below were completed at Evaluation unless otherwise noted.  DIAGNOSTIC FINDINGS: none recent  COGNITION: Overall cognitive status: Within functional limits for tasks assessed   SENSATION: Intact to light touch in B LEs   POSTURE:  anterior pelvic tilt in standing   PALPATION: very tight in B thoracolumbar paraspinals and glutes; no particular TTP  LUMBAR ROM:   Active  A/PROM  eval  Flexion ankle  Extension 10% limited C/o "pop"  Right lateral flexion Jt line  Left lateral flexion Jt line  Right rotation 25% limited  L rotation 25% limited   (Blank rows = not tested)   LOWER EXTREMITY MMT:     MMT (in sitting) Right Eval Left Eval  Hip flexion 4+ 4  Hip extension    Hip abduction 4 4  Hip adduction 4 4  Hip internal  rotation    Hip external rotation    Knee flexion 4+ 4-  Knee extension 4+ 4+  Ankle dorsiflexion 4+ 4+  Ankle plantarflexion 4+ 4+  Ankle inversion    Ankle eversion     (Blank rows = not tested)   GAIT: Gait pattern: L trunk lean and L>R hip drop Assistive device  utilized: None Level of assistance: Complete Independence  FUNCTIONAL TESTS:  16M walk: 11.26 sec (2.91 ft/sec)   TODAY'S TREATMENT:                                                                                                                              DATE: 06/22/23    PATIENT EDUCATION: Education details: prognosis, POC, HEP Person educated: Patient Education method: Explanation, Demonstration, Tactile cues, Verbal cues, and Handouts Education comprehension: verbalized understanding and returned demonstration  HOME EXERCISE PROGRAM: Access Code: [email or web address] URL: [email or web address] Date: 06/22/2023 Prepared by: [facility] - Outpatient  Rehab - [facility]  Exercises - Seated Flexion Stretch with Swiss Ball  - 1 x daily - 5 x weekly - 2 sets - 10 reps - 5 sec hold - Seated Pelvic Tilts  - 1 x daily - 5 x weekly - 2 sets - 10 reps - Supine Lower Trunk Rotation  - 1 x daily - 5 x weekly - 2 sets - 10 reps - Standing Hip Abduction with Counter Support  - 1 x daily - 5 x weekly - 2 sets - 10 reps  GOALS: Goals reviewed with patient? Yes  SHORT TERM GOALS: Target date: 07/13/2023  Patient to be independent with initial HEP. Baseline: HEP initiated Goal status: IN PROGRESS    LONG TERM GOALS: Target date: 08/03/2023  Patient to be independent with advanced HEP. Baseline: Not yet initiated  Goal status: IN PROGRESS  Patient to demonstrate B LE strength >/=4+/5.  Baseline: See above Goal status: IN PROGRESS  Patient to demonstrate lumbar AROM WFL and without pain limiting.  Baseline: see above Goal status: IN PROGRESS  Patient to report at least 5 hours of sleep uninterrupted by pain per average night.  Baseline: reports 3 hours of sleep per night  Goal status: IN PROGRESS  Patient to demonstrate 25% improvement in gait deviations.  Baseline:  L trunk lean and L>R hip drop Goal status: IN PROGRESS    ASSESSMENT:  CLINICAL  IMPRESSION: Patient arrived to session with report of resolution of UE N/T. Worked on standing LE strengthening activities today. Cues required occasionally to maintain tall upright posture throughout activities. Patient tolerated these activities well. Proceeded with stretching with good mobility demonstrated today. Patient tolerated session well and without complaints upon leaving.    OBJECTIVE IMPAIRMENTS: Abnormal gait, decreased activity tolerance, decreased ROM, decreased strength, increased muscle spasms, impaired flexibility, improper body mechanics, postural dysfunction, and pain.   ACTIVITY LIMITATIONS: carrying, lifting, bending, standing, squatting, stairs, transfers, bed mobility, reach over head, and hygiene/grooming  PARTICIPATION LIMITATIONS: meal prep,  cleaning, laundry, shopping, community activity, yard work, and church  PERSONAL FACTORS: Age, Past/current experiences, Time since onset of injury/illness/exacerbation, and 3+ comorbidities: HA's, DM, HLD, neuropathy  are also affecting patient's functional outcome.   REHAB POTENTIAL: Good  CLINICAL DECISION MAKING: Evolving/moderate complexity  EVALUATION COMPLEXITY: Moderate  PLAN:  PT FREQUENCY: 2x/week  PT DURATION: 6 weeks  PLANNED INTERVENTIONS: 97164- PT Re-evaluation, 97110-Therapeutic exercises, 97530- Therapeutic activity, [id number]- Neuromuscular re-education, 97535- Self Care, 29562- Manual therapy, [id number]- Gait training, [phone]- Canalith repositioning, [id number]- Aquatic Therapy, [phone]- Electrical stimulation (manual), Patient/Family education, Balance training, Stair training, Taping, Dry Needling, Joint mobilization, Spinal mobilization, Vestibular training, Cryotherapy, and Moist heat  PLAN FOR NEXT SESSION:  continue to progress hip and core strengthening, lumbopelvic mobility towards goals.   Baldemar Friday, PT, DPT 07/09/23 2:47 PM  [place] [facility] at [facility] [address] [place], [address] [place], [place] [address] Phone # [phone] Fax # [phone]

## 2023-07-09 ENCOUNTER — Ambulatory Visit: Payer: 59 | Admitting: Physical Therapy

## 2023-07-09 ENCOUNTER — Encounter: Payer: Self-pay | Admitting: Physical Therapy

## 2023-07-09 DIAGNOSIS — M5459 Other low back pain: Secondary | ICD-10-CM | POA: Diagnosis not present

## 2023-07-09 DIAGNOSIS — R2689 Other abnormalities of gait and mobility: Secondary | ICD-10-CM

## 2023-07-09 DIAGNOSIS — M6281 Muscle weakness (generalized): Secondary | ICD-10-CM

## 2023-07-13 ENCOUNTER — Ambulatory Visit: Payer: 59

## 2023-07-13 DIAGNOSIS — M6281 Muscle weakness (generalized): Secondary | ICD-10-CM

## 2023-07-13 DIAGNOSIS — R2689 Other abnormalities of gait and mobility: Secondary | ICD-10-CM

## 2023-07-13 DIAGNOSIS — M5459 Other low back pain: Secondary | ICD-10-CM | POA: Diagnosis not present

## 2023-07-13 NOTE — Therapy (Signed)
OUTPATIENT PHYSICAL THERAPY NEURO TREATMENT NOTE   Patient Name: Maureen Reyes MRN: [id number] DOB:1971/01/27, 52 y.o., female Today's Date: 07/13/2023   PCP: Olegario Messier, MD  REFERRING PROVIDER: Earl Lagos, MD   END OF SESSION:  PT End of Session - 07/13/23 1316     Visit Number 6    Number of Visits 13    Date for PT Re-Evaluation 08/03/23    Authorization Type UHC Medicare-Medicaid Dual Complete    Authorization - Visit Number 6    Authorization - Number of Visits 27    PT Start Time 1315    PT Stop Time 1400    PT Time Calculation (min) 45 min    Activity Tolerance Patient tolerated treatment well    Behavior During Therapy WFL for tasks assessed/performed                 Past Medical History:  Diagnosis Date   Arthritis    Chronic headaches    Colon polyps    Diabetes mellitus without complication (HCC)    HLD (hyperlipidemia)    Hot flashes 02/29/2020   Lumbar facet arthropathy    Neuropathy    Prediabetes    Sciatica of right side    Sciatica of right side 05/04/2014   Past Surgical History:  Procedure Laterality Date   COLONOSCOPY  2023   [facility]   TUBAL LIGATION  1990 pt reported   Patient Active Problem List   Diagnosis Date Noted   Genetic testing 04/20/2023   Colon polyps    Mononeuropathy 10/14/2022   Positive FIT (fecal immunochemical test) 07/24/2021   Controlled type 2 diabetes mellitus (HCC) 07/03/2021   Hypercholesteremia 07/03/2021   Chronic lower back pain 11/26/2020   Subclinical hypothyroidism 04/19/2019   Ganglion cyst 04/19/2019   Obesity (BMI 30-39.9) 12/09/2017   Atopic dermatitis 02/18/2017   White coat syndrome with high blood pressure but without hypertension 02/18/2017   Polyneuropathy 11/28/2014   Lumbar facet arthropathy 05/04/2014   Healthcare maintenance 05/04/2014   Tobacco use disorder, mild, in sustained remission 05/04/2014    ONSET DATE: "a while"  REFERRING DIAG: G62.89 (ICD-10-CM) -  Other polyneuropathy  THERAPY DIAG:  Other low back pain  Muscle weakness (generalized)  Other abnormalities of gait and mobility  Rationale for Evaluation and Treatment: Rehabilitation  SUBJECTIVE:                                                                                                                                                                                             SUBJECTIVE STATEMENT: Noticing the tingling in the feet worsens with the cold/damp  and worse at night  Pt accompanied by: self  PERTINENT HISTORY: HA's, DM, HLD, neuropathy  PAIN:  Are you having pain? Yes: NPRS scale: 2-3/10 Pain location: back and both legs Pain description: dull Aggravating factors: night time Relieving factors: massage, stretching her back, change positions  PRECAUTIONS: None  RED FLAGS: None   WEIGHT BEARING RESTRICTIONS: No  FALLS: Has patient fallen in last 6 months? 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  Changed hip abd to add green loop around knees  Sit to stand 2x5 reps Green loop around knees  Long sitting hamstring stretch 1x60 sec One leg elevated EOM            TODAY'S TREATMENT: 07/09/23 Activity Comments  nustep L4 x 6 min UEs/LEs    standing march #2 2x20  At counter; good form  standing hip ABD #2 2x10 each At counter: cues to correct anterior and lateral trunk lean   standing hip EX #2 2x10 each  At counter: cues to correct anterior trunk lean   mini squats 2x10 At counter: cues to prevent forward trunk lean   Open book stretch 10x each  side To tolerance; good tolerance and form   standing lumbar flexion at counter 3x10" Good tolerance   cat/cow Manual cues to coordinate head movements with pelvic tilts for gentle cervical stretch   child's pose with pillow under bottom Did not tolerate d/t knee discomfort  Sitting prayer stretch with pball 10x5" Better tolerance     PATIENT EDUCATION: Education details: prognosis, POC, HEP Person educated: Patient Education method: Explanation, Demonstration, Tactile cues, Verbal cues, and Handouts Education comprehension: verbalized understanding and returned demonstration  HOME EXERCISE PROGRAM Last updated: 07/01/23 Access Code: [email or web address] URL: [email or web address] Date: 07/01/2023 Prepared by: [facility] - Outpatient  Rehab - [facility]  Exercises - Seated Flexion Stretch with Swiss Ball  - 1 x daily - 5 x weekly - 2 sets - 10 reps - 5 sec hold - Seated Pelvic Tilts  - 1 x daily - 5 x weekly - 2 sets - 10 reps - Supine Lower Trunk Rotation  - 1 x daily - 5 x weekly - 2 sets - 10 reps - Supine Piriformis Stretch with Foot on Ground  - 1 x daily - 7 x weekly - 1 sets - 3-5 reps - 15-30 sec hold - Supine Posterior Pelvic Tilt  - 1 x daily - 7 x weekly - 1 sets - 10 reps - Supine Bridge with Resistance Band  - 1 x daily - 5 x weekly - 2 sets - 10 reps - Standing Hip Abduction with Resistance at Ankles and Counter Support  - 1 x daily - 7 x weekly - 3 sets - 10 reps - Sit to Stand with Resistance Around Legs  - 1 x daily - 7 x weekly - 3 sets - 5 reps    ------------------------------------------------- Note: Objective measures below were completed at Evaluation unless otherwise noted.  DIAGNOSTIC FINDINGS: none recent  COGNITION: Overall cognitive status: Within functional limits for tasks  assessed   SENSATION: Intact to light touch in B LEs   POSTURE:  anterior pelvic tilt in standing   PALPATION: very tight in B thoracolumbar paraspinals and glutes;  no particular TTP  LUMBAR ROM:   Active  A/PROM  eval  Flexion ankle  Extension 10% limited C/o "pop"  Right lateral flexion Jt line  Left lateral flexion Jt line  Right rotation 25% limited  L rotation 25% limited   (Blank rows = not tested)   LOWER EXTREMITY MMT:     MMT (in sitting) Right Eval Left Eval  Hip flexion 4+ 4  Hip extension    Hip abduction 4 4  Hip adduction 4 4  Hip internal rotation    Hip external rotation    Knee flexion 4+ 4-  Knee extension 4+ 4+  Ankle dorsiflexion 4+ 4+  Ankle plantarflexion 4+ 4+  Ankle inversion    Ankle eversion     (Blank rows = not tested)   GAIT: Gait pattern: L trunk lean and L>R hip drop Assistive device utilized: None Level of assistance: Complete Independence  FUNCTIONAL TESTS:  76M walk: 11.26 sec (2.91 ft/sec)        GOALS: Goals reviewed with patient? Yes  SHORT TERM GOALS: Target date: 07/13/2023  Patient to be independent with initial HEP. Baseline: HEP initiated Goal status: MET    LONG TERM GOALS: Target date: 08/03/2023  Patient to be independent with advanced HEP. Baseline: Not yet initiated  Goal status: IN PROGRESS  Patient to demonstrate B LE strength >/=4+/5.  Baseline: See above Goal status: IN PROGRESS  Patient to demonstrate lumbar AROM WFL and without pain limiting.  Baseline: see above Goal status: IN PROGRESS  Patient to report at least 5 hours of sleep uninterrupted by pain per average night.  Baseline: reports 3 hours of sleep per night  Goal status: IN PROGRESS  Patient to demonstrate 25% improvement in gait deviations.  Baseline:  L trunk lean and L>R hip drop Goal status: IN PROGRESS    ASSESSMENT:  CLINICAL IMPRESSION: Pt notes presence of numbness/tingling in bilat feet which seems to be independent of activity or position and notes more obvious presentation at night and with cold/damp weather. Review of HEP to present with excellent recall only requiring  occasional correction to form with supine pelvic tilts. Progressed hip strengthening to include resistance loop for hip abduction and introduced sit to stand with loop around knees to improve LE strength/power. Pt denies any certain position or activity that causes back pain and notes episodic flare-ups which are relieved with pressure.  Demo elastic back support braces that may aid in comfort and reduced back pain with activity. Continued sessions to progress general strength, balance, and body mechanics  OBJECTIVE IMPAIRMENTS: Abnormal gait, decreased activity tolerance, decreased ROM, decreased strength, increased muscle spasms, impaired flexibility, improper body mechanics, postural dysfunction, and pain.   ACTIVITY LIMITATIONS: carrying, lifting, bending, standing, squatting, stairs, transfers, bed mobility, reach over head, and hygiene/grooming  PARTICIPATION LIMITATIONS: meal prep, cleaning, laundry, shopping, community activity, yard work, and church  PERSONAL FACTORS: Age, Past/current experiences, Time since onset of injury/illness/exacerbation, and 3+ comorbidities: HA's, DM, HLD, neuropathy  are also affecting patient's functional outcome.   REHAB POTENTIAL: Good  CLINICAL DECISION MAKING: Evolving/moderate complexity  EVALUATION COMPLEXITY: Moderate  PLAN:  PT FREQUENCY: 2x/week  PT DURATION: 6 weeks  PLANNED INTERVENTIONS: 97164- PT Re-evaluation, 97110-Therapeutic exercises, 97530- Therapeutic activity, [id number]- Neuromuscular re-education, 97535- Self Care, 16109- Manual therapy, [id number]- Gait  training, [phone]- Canalith repositioning, [phone]- Aquatic Therapy, [phone]- Electrical stimulation (manual), Patient/Family education, Balance training, Stair training, Taping, Dry Needling, Joint mobilization, Spinal mobilization, Vestibular training, Cryotherapy, and Moist heat  PLAN FOR NEXT SESSION:  continue to progress hip and core strengthening, lumbopelvic mobility towards  goals.   2:08 PM, 07/13/23 M. Shary Decamp, PT, DPT Physical Therapist- [place] Office Number: [phone]

## 2023-07-15 ENCOUNTER — Ambulatory Visit: Payer: 59 | Admitting: Physical Therapy

## 2023-07-15 ENCOUNTER — Encounter: Payer: Self-pay | Admitting: Physical Therapy

## 2023-07-15 DIAGNOSIS — R2689 Other abnormalities of gait and mobility: Secondary | ICD-10-CM

## 2023-07-15 DIAGNOSIS — M6281 Muscle weakness (generalized): Secondary | ICD-10-CM

## 2023-07-15 DIAGNOSIS — M5459 Other low back pain: Secondary | ICD-10-CM | POA: Diagnosis not present

## 2023-07-15 NOTE — Therapy (Signed)
OUTPATIENT PHYSICAL THERAPY NEURO TREATMENT NOTE   Patient Name: Maureen Reyes MRN: [id number] DOB:Dec 20, 1970, 52 y.o., female Today's Date: 07/15/2023   PCP: Olegario Messier, MD  REFERRING PROVIDER: Earl Lagos, MD   END OF SESSION:  PT End of Session - 07/15/23 1316     Visit Number 7    Number of Visits 13    Date for PT Re-Evaluation 08/03/23    Authorization Type UHC Medicare-Medicaid Dual Complete    Authorization - Visit Number 7    Authorization - Number of Visits 27    PT Start Time 1317    PT Stop Time 1356    PT Time Calculation (min) 39 min    Activity Tolerance Patient tolerated treatment well    Behavior During Therapy WFL for tasks assessed/performed                  Past Medical History:  Diagnosis Date   Arthritis    Chronic headaches    Colon polyps    Diabetes mellitus without complication (HCC)    HLD (hyperlipidemia)    Hot flashes 02/29/2020   Lumbar facet arthropathy    Neuropathy    Prediabetes    Sciatica of right side    Sciatica of right side 05/04/2014   Past Surgical History:  Procedure Laterality Date   COLONOSCOPY  2023   [facility]   TUBAL LIGATION  1990 pt reported   Patient Active Problem List   Diagnosis Date Noted   Genetic testing 04/20/2023   Colon polyps    Mononeuropathy 10/14/2022   Positive FIT (fecal immunochemical test) 07/24/2021   Controlled type 2 diabetes mellitus (HCC) 07/03/2021   Hypercholesteremia 07/03/2021   Chronic lower back pain 11/26/2020   Subclinical hypothyroidism 04/19/2019   Ganglion cyst 04/19/2019   Obesity (BMI 30-39.9) 12/09/2017   Atopic dermatitis 02/18/2017   White coat syndrome with high blood pressure but without hypertension 02/18/2017   Polyneuropathy 11/28/2014   Lumbar facet arthropathy 05/04/2014   Healthcare maintenance 05/04/2014   Tobacco use disorder, mild, in sustained remission 05/04/2014    ONSET DATE: "a while"  REFERRING DIAG: G62.89 (ICD-10-CM)  - Other polyneuropathy  THERAPY DIAG:  Other low back pain  Muscle weakness (generalized)  Other abnormalities of gait and mobility  Rationale for Evaluation and Treatment: Rehabilitation  SUBJECTIVE:                                                                                                                                                                                             SUBJECTIVE STATEMENT: The pain is better today.  Wasn't as good  the other day with the rain.  I do try to wake up and stretch, so maybe that is helping.  More burning in my feet today than pain.  Pt accompanied by: self  PERTINENT HISTORY: HA's, DM, HLD, neuropathy  PAIN:  Are you having pain? Yes: NPRS scale: 1/10 Pain location: L leg Pain description: dull Aggravating factors: night time Relieving factors: massage, stretching her back, change positions  PRECAUTIONS: None  RED FLAGS: None   WEIGHT BEARING RESTRICTIONS: No  FALLS: Has patient fallen in last 6 months? 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, agricultural  PATIENT GOALS: improve pain   OBJECTIVE:    TODAY'S TREATMENT: 07/15/2023 Activity Comments  Sit <>stand with green band around knees, 2 x 5 reps Good return demo  Sidestepping 2 minutes Cues for posture  Forward/back walking x 2 minutes   Monster walk forward/back 2 min   Resisted sidestep x 2 minutes Green band, cues for posture, feels fatigued in hip abductors at end of ex  Seated piriformis stretch, 2 x 30 sec BLEs   Quadruped cat/cow, then stabilization with alt UE lift x 5, then alt leg lift x 5 Cues for neutral spine, some lateral lean through hips  Alt step taps 10 reps, then consecutive x 10; forward step ups 10 reps Slight  [place] LLE Light UE support  Minisquat to stand, x 10 reps, then 5 reps holding 6# weight   Suitcase carry, 6# RLE, then LLE x 60 ft each Pt c/o fatigue, pain L back at end of this activity     PATIENT EDUCATION: Education details: Continue current HEP; cues for glut activation with standing HEP Person educated: Patient Education method: Explanation, Demonstration, Tactile cues, Verbal cues, and Handouts Education comprehension: verbalized understanding and returned demonstration  HOME EXERCISE PROGRAM Last updated: 07/01/23 Access Code: [email or web address] URL: [email or web address] Date: 07/01/2023 Prepared by: [facility] - Outpatient  Rehab - [facility]  Exercises - Seated Flexion Stretch with Swiss Ball  - 1 x daily - 5 x weekly - 2 sets - 10 reps - 5 sec hold - Seated Pelvic Tilts  - 1 x daily - 5 x weekly - 2 sets - 10 reps - Supine Lower Trunk Rotation  - 1 x daily - 5 x weekly - 2 sets - 10 reps - Supine Piriformis Stretch with Foot on Ground  - 1 x daily - 7 x weekly - 1 sets - 3-5 reps - 15-30 sec hold - Supine Posterior Pelvic Tilt  - 1 x daily - 7 x weekly - 1 sets - 10 reps - Supine Bridge with Resistance Band  - 1 x daily - 5 x weekly - 2 sets - 10 reps - Standing Hip Abduction with Resistance at Ankles and Counter Support  - 1 x daily - 7 x weekly - 3 sets - 10 reps - Sit to Stand with Resistance Around Legs  - 1 x daily - 7 x weekly - 3 sets - 5 reps    ------------------------------------------------- Note: Objective measures below were completed at Evaluation unless otherwise noted.  DIAGNOSTIC FINDINGS: none recent  COGNITION: Overall cognitive status: Within functional limits for tasks assessed   SENSATION: Intact to light touch in B LEs  POSTURE:  anterior pelvic tilt in standing   PALPATION: very tight in B thoracolumbar paraspinals and glutes; no particular TTP  LUMBAR ROM:   Active  A/PROM  eval  Flexion ankle  Extension 10%  limited C/o "pop"  Right lateral flexion Jt line  Left lateral flexion Jt line  Right rotation 25% limited  L rotation 25% limited   (Blank rows = not tested)   LOWER EXTREMITY MMT:     MMT (in sitting) Right Eval Left Eval  Hip flexion 4+ 4  Hip extension    Hip abduction 4 4  Hip adduction 4 4  Hip internal rotation    Hip external rotation    Knee flexion 4+ 4-  Knee extension 4+ 4+  Ankle dorsiflexion 4+ 4+  Ankle plantarflexion 4+ 4+  Ankle inversion    Ankle eversion     (Blank rows = not tested)   GAIT: Gait pattern: L trunk lean and L>R hip drop Assistive device utilized: None Level of assistance: Complete Independence  FUNCTIONAL TESTS:  88M walk: 11.26 sec (2.91 ft/sec)        GOALS: Goals reviewed with patient? Yes  SHORT TERM GOALS: Target date: 07/13/2023  Patient to be independent with initial HEP. Baseline: HEP initiated Goal status: MET    LONG TERM GOALS: Target date: 08/03/2023  Patient to be independent with advanced HEP. Baseline: Not yet initiated  Goal status: IN PROGRESS  Patient to demonstrate B LE strength >/=4+/5.  Baseline: See above Goal status: IN PROGRESS  Patient to demonstrate lumbar AROM WFL and without pain limiting.  Baseline: see above Goal status: IN PROGRESS  Patient to report at least 5 hours of sleep uninterrupted by pain per average night.  Baseline: reports 3 hours of sleep per night  Goal status: IN PROGRESS  Patient to demonstrate 25% improvement in gait deviations.  Baseline:  L trunk lean and L>R hip drop Goal status: IN PROGRESS    ASSESSMENT:  CLINICAL IMPRESSION: Pt reports today is a better pain day, with only the pain in her feet today, not as much the low back.  Focused on hip strengthening and abdominal activation exercises; she tolerates well, but with LLE as stance and with gait, she continues to demo increased lateral trunk lean/[place] LLE.  She notes at end of session after  addition of weighted standing/gait activities some fatigue and slight increase pain in low back.  She will continue to benefit from further strengthening through gluts, hips, abdominal musculature for core stabilization and improved gait stability and decreased pain.    OBJECTIVE IMPAIRMENTS: Abnormal gait, decreased activity tolerance, decreased ROM, decreased strength, increased muscle spasms, impaired flexibility, improper body mechanics, postural dysfunction, and pain.   ACTIVITY LIMITATIONS: carrying, lifting, bending, standing, squatting, stairs, transfers, bed mobility, reach over head, and hygiene/grooming  PARTICIPATION LIMITATIONS: meal prep, cleaning, laundry, shopping, community activity, yard work, and church  PERSONAL FACTORS: Age, Past/current experiences, Time since onset of injury/illness/exacerbation, and 3+ comorbidities: HA's, DM, HLD, neuropathy  are also affecting patient's functional outcome.   REHAB POTENTIAL: Good  CLINICAL DECISION MAKING: Evolving/moderate complexity  EVALUATION COMPLEXITY: Moderate  PLAN:  PT FREQUENCY: 2x/week  PT DURATION: 6 weeks  PLANNED INTERVENTIONS: 97164- PT Re-evaluation, 97110-Therapeutic exercises, 97530- Therapeutic activity, 97112- Neuromuscular re-education, 97535- Self Care, 74259- Manual therapy, [id number]- Gait training, [phone]- Canalith repositioning, [id number]- Aquatic Therapy, [phone]- Electrical stimulation (manual), Patient/Family education, Balance training, Stair training, Taping, Dry Needling, Joint mobilization, Spinal mobilization, Vestibular training, Cryotherapy, and Moist heat  PLAN FOR NEXT SESSION:  Continue to progress hip and core strengthening, specifically work on L glut activation and lumbopelvic mobility towards goals.   Lonia Blood, PT 07/15/23 2:10 PM Phone: [phone] Fax: [phone]   [facility] at [facility] [address] [place], [address] [place], [place] [address] Phone # [phone] Fax # [phone]

## 2023-07-16 NOTE — Therapy (Signed)
OUTPATIENT PHYSICAL THERAPY NEURO TREATMENT NOTE   Patient Name: Maureen Reyes MRN: [id number] DOB:01-28-1971, 52 y.o., female Today's Date: 07/20/2023   PCP: Olegario Messier, MD  REFERRING PROVIDER: Earl Lagos, MD   END OF SESSION:  PT End of Session - 07/20/23 1353     Visit Number 8    Number of Visits 13    Date for PT Re-Evaluation 08/03/23    Authorization Type UHC Medicare-Medicaid Dual Complete    Authorization - Visit Number 8    Authorization - Number of Visits 27    PT Start Time 1322   pt late   PT Stop Time 1357    PT Time Calculation (min) 35 min    Activity Tolerance Patient tolerated treatment well    Behavior During Therapy WFL for tasks assessed/performed                   Past Medical History:  Diagnosis Date   Arthritis    Chronic headaches    Colon polyps    Diabetes mellitus without complication (HCC)    HLD (hyperlipidemia)    Hot flashes 02/29/2020   Lumbar facet arthropathy    Neuropathy    Prediabetes    Sciatica of right side    Sciatica of right side 05/04/2014   Past Surgical History:  Procedure Laterality Date   COLONOSCOPY  2023   [facility]   TUBAL LIGATION  1990 pt reported   Patient Active Problem List   Diagnosis Date Noted   Genetic testing 04/20/2023   Colon polyps    Mononeuropathy 10/14/2022   Positive FIT (fecal immunochemical test) 07/24/2021   Controlled type 2 diabetes mellitus (HCC) 07/03/2021   Hypercholesteremia 07/03/2021   Chronic lower back pain 11/26/2020   Subclinical hypothyroidism 04/19/2019   Ganglion cyst 04/19/2019   Obesity (BMI 30-39.9) 12/09/2017   Atopic dermatitis 02/18/2017   White coat syndrome with high blood pressure but without hypertension 02/18/2017   Polyneuropathy 11/28/2014   Lumbar facet arthropathy 05/04/2014   Healthcare maintenance 05/04/2014   Tobacco use disorder, mild, in sustained remission 05/04/2014    ONSET DATE: "a while"  REFERRING DIAG: G62.89  (ICD-10-CM) - Other polyneuropathy  THERAPY DIAG:  Other low back pain  Muscle weakness (generalized)  Other abnormalities of gait and mobility  Rationale for Evaluation and Treatment: Rehabilitation  SUBJECTIVE:                                                                                                                                                                                             SUBJECTIVE STATEMENT: Traffic is crazy. Started developing  a new pain, almost like a fist-like knot on the R side of the mid back which developed at night.   Pt accompanied by: self  PERTINENT HISTORY: HA's, DM, HLD, neuropathy  PAIN:  Are you having pain? Yes: NPRS scale: 7/10 Pain location: L leg/LB and R side of midback Pain description: dull Aggravating factors: night time Relieving factors: massage, stretching her back, change positions  PRECAUTIONS: None  RED FLAGS: None   WEIGHT BEARING RESTRICTIONS: No  FALLS: Has patient fallen in last 6 months? 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, agricultural  PATIENT GOALS: improve pain   OBJECTIVE:     TODAY'S TREATMENT: 07/20/23 Activity Comments  STM to R lower thoracic paraspinals  Tight and TTP but tolerated well   bridge ball 10x  good form   straight leg bridge 10x  Core instability which improved with increased reps   clamshell 10x  Cues to stop before posterior trunk rocks   inverse clam red TB 10x  PT holding TB taut  open book stretch with red TB 10x  Cues for control throughout   L diagonal prayer stretch with green pball 10x  Finding angle for best relief   Edu, demo, and practice using ball on wall self-STM to R thoracic paraspinals  Reported good benefit         HOME EXERCISE  PROGRAM Last updated: 07/01/23 Access Code: [email or web address] URL: [email or web address] Date: 07/01/2023 Prepared by: [facility] - Outpatient  Rehab - [facility]  Exercises - Seated Flexion Stretch with Swiss Ball  - 1 x daily - 5 x weekly - 2 sets - 10 reps - 5 sec hold - Seated Pelvic Tilts  - 1 x daily - 5 x weekly - 2 sets - 10 reps - Supine Lower Trunk Rotation  - 1 x daily - 5 x weekly - 2 sets - 10 reps - Supine Piriformis Stretch with Foot on Ground  - 1 x daily - 7 x weekly - 1 sets - 3-5 reps - 15-30 sec hold - Supine Posterior Pelvic Tilt  - 1 x daily - 7 x weekly - 1 sets - 10 reps - Supine Bridge with Resistance Band  - 1 x daily - 5 x weekly - 2 sets - 10 reps - Standing Hip Abduction with Resistance at Ankles and Counter Support  - 1 x daily - 7 x weekly - 3 sets - 10 reps - Sit to Stand with Resistance Around Legs  - 1 x daily - 7 x weekly - 3 sets - 5 reps    ------------------------------------------------- Note: Objective measures below were completed at Evaluation unless otherwise noted.  DIAGNOSTIC FINDINGS: none recent  COGNITION: Overall cognitive status: Within functional limits for tasks assessed   SENSATION: Intact to light touch in B LEs   POSTURE:  anterior pelvic tilt in standing   PALPATION: very tight in B thoracolumbar paraspinals and glutes; no particular TTP  LUMBAR ROM:   Active  A/PROM  eval  Flexion ankle  Extension 10% limited C/o "pop"  Right lateral flexion Jt line  Left lateral flexion Jt line  Right rotation 25% limited  L rotation 25% limited   (Blank rows = not tested)   LOWER EXTREMITY MMT:  MMT (in sitting) Right Eval Left Eval  Hip flexion 4+ 4  Hip extension    Hip abduction 4 4  Hip adduction 4 4  Hip internal rotation    Hip external rotation    Knee flexion 4+ 4-  Knee extension 4+ 4+  Ankle dorsiflexion 4+ 4+  Ankle plantarflexion 4+ 4+  Ankle inversion    Ankle eversion     (Blank rows =  not tested)   GAIT: Gait pattern: L trunk lean and L>R hip drop Assistive device utilized: None Level of assistance: Complete Independence  FUNCTIONAL TESTS:  73M walk: 11.26 sec (2.91 ft/sec)        GOALS: Goals reviewed with patient? Yes  SHORT TERM GOALS: Target date: 07/13/2023  Patient to be independent with initial HEP. Baseline: HEP initiated Goal status: MET    LONG TERM GOALS: Target date: 08/03/2023  Patient to be independent with advanced HEP. Baseline: Not yet initiated  Goal status: IN PROGRESS  Patient to demonstrate B LE strength >/=4+/5.  Baseline: See above Goal status: IN PROGRESS  Patient to demonstrate lumbar AROM WFL and without pain limiting.  Baseline: see above Goal status: IN PROGRESS  Patient to report at least 5 hours of sleep uninterrupted by pain per average night.  Baseline: reports 3 hours of sleep per night  Goal status: IN PROGRESS  Patient to demonstrate 25% improvement in gait deviations.  Baseline:  L trunk lean and L>R hip drop Goal status: IN PROGRESS    ASSESSMENT:  CLINICAL IMPRESSION: Patient arrived to session with report of new onset of R midback pain which started during the night. Patient tight and TTP over R lower thoracic paraspinals; tolerated STM well. Proceeded with progressive core and hip strengthening which was performed with good carryover after cueing. Patient tolerated duration of session well and without complaints upon leaving.   OBJECTIVE IMPAIRMENTS: Abnormal gait, decreased activity tolerance, decreased ROM, decreased strength, increased muscle spasms, impaired flexibility, improper body mechanics, postural dysfunction, and pain.   ACTIVITY LIMITATIONS: carrying, lifting, bending, standing, squatting, stairs, transfers, bed mobility, reach over head, and hygiene/grooming  PARTICIPATION LIMITATIONS: meal prep, cleaning, laundry, shopping, community activity, yard work, and church  PERSONAL  FACTORS: Age, Past/current experiences, Time since onset of injury/illness/exacerbation, and 3+ comorbidities: HA's, DM, HLD, neuropathy  are also affecting patient's functional outcome.   REHAB POTENTIAL: Good  CLINICAL DECISION MAKING: Evolving/moderate complexity  EVALUATION COMPLEXITY: Moderate  PLAN:  PT FREQUENCY: 2x/week  PT DURATION: 6 weeks  PLANNED INTERVENTIONS: 97164- PT Re-evaluation, 97110-Therapeutic exercises, 97530- Therapeutic activity, [id number]- Neuromuscular re-education, 97535- Self Care, [id number]- Manual therapy, [id number]- Gait training, [phone]- Canalith repositioning, [id number]- Aquatic Therapy, [phone]- Electrical stimulation (manual), Patient/Family education, Balance training, Stair training, Taping, Dry Needling, Joint mobilization, Spinal mobilization, Vestibular training, Cryotherapy, and Moist heat  PLAN FOR NEXT SESSION:  Continue to progress hip and core strengthening, specifically work on L glut activation and lumbopelvic mobility towards goals.  Baldemar Friday, PT, DPT 07/20/23 1:59 PM  [facility] at [facility] [address] [place], [address] [place], [place] [address] Phone # [phone] Fax # [phone]

## 2023-07-20 ENCOUNTER — Ambulatory Visit: Payer: 59 | Admitting: Physical Therapy

## 2023-07-20 ENCOUNTER — Encounter: Payer: Self-pay | Admitting: Physical Therapy

## 2023-07-20 DIAGNOSIS — M5459 Other low back pain: Secondary | ICD-10-CM | POA: Diagnosis not present

## 2023-07-20 DIAGNOSIS — M6281 Muscle weakness (generalized): Secondary | ICD-10-CM

## 2023-07-20 DIAGNOSIS — R2689 Other abnormalities of gait and mobility: Secondary | ICD-10-CM

## 2023-07-23 ENCOUNTER — Other Ambulatory Visit (HOSPITAL_COMMUNITY): Payer: Self-pay

## 2023-07-23 ENCOUNTER — Ambulatory Visit: Payer: 59

## 2023-07-23 ENCOUNTER — Other Ambulatory Visit: Payer: Self-pay

## 2023-07-23 DIAGNOSIS — M5459 Other low back pain: Secondary | ICD-10-CM

## 2023-07-23 DIAGNOSIS — M6281 Muscle weakness (generalized): Secondary | ICD-10-CM

## 2023-07-23 DIAGNOSIS — R2689 Other abnormalities of gait and mobility: Secondary | ICD-10-CM

## 2023-07-23 NOTE — Therapy (Signed)
OUTPATIENT PHYSICAL THERAPY NEURO TREATMENT NOTE   Patient Name: Maureen Reyes MRN: [id number] DOB:February 07, 1971, 52 y.o., female Today's Date: 07/23/2023   PCP: Olegario Messier, MD  REFERRING PROVIDER: Earl Lagos, MD   END OF SESSION:  PT End of Session - 07/23/23 1408     Visit Number 9    Number of Visits 13    Date for PT Re-Evaluation 08/03/23    Authorization Type UHC Medicare-Medicaid Dual Complete    Authorization - Visit Number 9    Authorization - Number of Visits 27    PT Start Time 1407    PT Stop Time 1453    PT Time Calculation (min) 46 min    Activity Tolerance Patient tolerated treatment well    Behavior During Therapy WFL for tasks assessed/performed                   Past Medical History:  Diagnosis Date   Arthritis    Chronic headaches    Colon polyps    Diabetes mellitus without complication (HCC)    HLD (hyperlipidemia)    Hot flashes 02/29/2020   Lumbar facet arthropathy    Neuropathy    Prediabetes    Sciatica of right side    Sciatica of right side 05/04/2014   Past Surgical History:  Procedure Laterality Date   COLONOSCOPY  2023   [facility]   TUBAL LIGATION  1990 pt reported   Patient Active Problem List   Diagnosis Date Noted   Genetic testing 04/20/2023   Colon polyps    Mononeuropathy 10/14/2022   Positive FIT (fecal immunochemical test) 07/24/2021   Controlled type 2 diabetes mellitus (HCC) 07/03/2021   Hypercholesteremia 07/03/2021   Chronic lower back pain 11/26/2020   Subclinical hypothyroidism 04/19/2019   Ganglion cyst 04/19/2019   Obesity (BMI 30-39.9) 12/09/2017   Atopic dermatitis 02/18/2017   White coat syndrome with high blood pressure but without hypertension 02/18/2017   Polyneuropathy 11/28/2014   Lumbar facet arthropathy 05/04/2014   Healthcare maintenance 05/04/2014   Tobacco use disorder, mild, in sustained remission 05/04/2014    ONSET DATE: "a while"  REFERRING DIAG: G62.89  (ICD-10-CM) - Other polyneuropathy  THERAPY DIAG:  Other low back pain  Muscle weakness (generalized)  Other abnormalities of gait and mobility  Rationale for Evaluation and Treatment: Rehabilitation  SUBJECTIVE:                                                                                                                                                                                             SUBJECTIVE STATEMENT: Back pain is still present. Worsening spasms as  of late   Pt accompanied by: self  PERTINENT HISTORY: HA's, DM, HLD, neuropathy  PAIN:  Are you having pain? Yes: NPRS scale: 6-7/10 Pain location: L leg/LB and R side of midback Pain description: dull Aggravating factors: night time Relieving factors: massage, stretching her back, change positions  PRECAUTIONS: None  RED FLAGS: None   WEIGHT BEARING RESTRICTIONS: No  FALLS: Has patient fallen in last 6 months? 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 Explanation during course of use (2x20 min) in increased intensity/change of mode with onset of habituation.   Supine LTR x 60 sec   Bridge 1x10   inverse clam red TB 2x10   Bridge + green loop 2x10   Child's pose 1x10 deep breaths Too hard on knees  NU-step x 10 min Warm-up x 2 min. Goal of 60-90 SPM x 6 min X2 min cooldown           TODAY'S TREATMENT: 07/20/23 Activity Comments  STM to R lower thoracic paraspinals  Tight and TTP but tolerated well   bridge ball 10x  good form   straight leg bridge 10x  Core instability which improved with increased reps    clamshell 10x  Cues to stop before posterior trunk rocks   inverse clam red TB 10x  PT holding TB taut  open book stretch with red TB 10x  Cues for control throughout   L diagonal prayer stretch with green pball 10x  Finding angle for best relief   Edu, demo, and practice using ball on wall self-STM to R thoracic paraspinals  Reported good benefit         HOME EXERCISE PROGRAM Last updated: 07/01/23 Access Code: [email or web address] URL: [email or web address] Date: 07/01/2023 Prepared by: [facility] - Outpatient  Rehab - [facility]  Exercises - Seated Flexion Stretch with Swiss Ball  - 1 x daily - 5 x weekly - 2 sets - 10 reps - 5 sec hold - Seated Pelvic Tilts  - 1 x daily - 5 x weekly - 2 sets - 10 reps - Supine Lower Trunk Rotation  - 1 x daily - 5 x weekly - 2 sets - 10 reps - Supine Piriformis Stretch with Foot on Ground  - 1 x daily - 7 x weekly - 1 sets - 3-5 reps - 15-30 sec hold - Supine Posterior Pelvic Tilt  - 1 x daily - 7 x weekly - 1 sets - 10 reps - Supine Bridge with Resistance Band  - 1 x daily - 5 x weekly - 2 sets - 10 reps - Standing Hip Abduction with Resistance at Ankles and Counter Support  - 1 x daily - 7 x weekly - 3 sets - 10 reps - Sit to Stand with Resistance Around Legs  - 1 x daily - 7 x weekly - 3 sets - 5 reps    ------------------------------------------------- Note: Objective measures below were completed at Evaluation unless otherwise noted.  DIAGNOSTIC FINDINGS: none recent  COGNITION: Overall cognitive status: Within functional limits for tasks assessed   SENSATION: Intact to light touch in  B LEs   POSTURE:  anterior pelvic tilt in standing   PALPATION: very tight in B thoracolumbar paraspinals and glutes; no particular TTP  LUMBAR ROM:   Active  A/PROM  eval  Flexion ankle  Extension 10% limited C/o "pop"  Right lateral flexion Jt line  Left lateral flexion Jt line  Right rotation 25% limited  L rotation 25% limited    (Blank rows = not tested)   LOWER EXTREMITY MMT:     MMT (in sitting) Right Eval Left Eval  Hip flexion 4+ 4  Hip extension    Hip abduction 4 4  Hip adduction 4 4  Hip internal rotation    Hip external rotation    Knee flexion 4+ 4-  Knee extension 4+ 4+  Ankle dorsiflexion 4+ 4+  Ankle plantarflexion 4+ 4+  Ankle inversion    Ankle eversion     (Blank rows = not tested)   GAIT: Gait pattern: L trunk lean and L>R hip drop Assistive device utilized: None Level of assistance: Complete Independence  FUNCTIONAL TESTS:  75M walk: 11.26 sec (2.91 ft/sec)        GOALS: Goals reviewed with patient? Yes  SHORT TERM GOALS: Target date: 07/13/2023  Patient to be independent with initial HEP. Baseline: HEP initiated Goal status: MET    LONG TERM GOALS: Target date: 08/03/2023  Patient to be independent with advanced HEP. Baseline: Not yet initiated  Goal status: IN PROGRESS  Patient to demonstrate B LE strength >/=4+/5.  Baseline: See above Goal status: IN PROGRESS  Patient to demonstrate lumbar AROM WFL and without pain limiting.  Baseline: see above Goal status: IN PROGRESS  Patient to report at least 5 hours of sleep uninterrupted by pain per average night.  Baseline: reports 3 hours of sleep per night  Goal status: IN PROGRESS  Patient to demonstrate 25% improvement in gait deviations.  Baseline:  L trunk lean and L>R hip drop Goal status: IN PROGRESS    ASSESSMENT:  CLINICAL IMPRESSION: Notes ongoing issue of muscle spasm in right paraspinals. Demo and application of TENs unit to area to wear while exercising and rationale of device's use.  Continued with strengthening and mobility to improve lumbopelvic strength/coordination to improve stability and facilitate core strength for stabilization.  Ending with steady-state cardiovascular training and education on rationale of exercise intervention in those with chronic LBP. Verbalizes understanding.   Continued sessions to progress HEP details to improve general strength and activity tolerance.  Notes good tolerance to use of TENs unit and provided with relevant information for purchase.   OBJECTIVE IMPAIRMENTS: Abnormal gait, decreased activity tolerance, decreased ROM, decreased strength, increased muscle spasms, impaired flexibility, improper body mechanics, postural dysfunction, and pain.   ACTIVITY LIMITATIONS: carrying, lifting, bending, standing, squatting, stairs, transfers, bed mobility, reach over head, and hygiene/grooming  PARTICIPATION LIMITATIONS: meal prep, cleaning, laundry, shopping, community activity, yard work, and church  PERSONAL FACTORS: Age, Past/current experiences, Time since onset of injury/illness/exacerbation, and 3+ comorbidities: HA's, DM, HLD, neuropathy  are also affecting patient's functional outcome.   REHAB POTENTIAL: Good  CLINICAL DECISION MAKING: Evolving/moderate complexity  EVALUATION COMPLEXITY: Moderate  PLAN:  PT FREQUENCY: 2x/week  PT DURATION: 6 weeks  PLANNED INTERVENTIONS: 97164- PT Re-evaluation, 97110-Therapeutic exercises, 97530- Therapeutic activity, 97112- Neuromuscular re-education, 97535- Self Care, 44034- Manual therapy, [phone]- Gait training, [phone]- Canalith repositioning, [id number]- Aquatic Therapy, [phone]- Electrical stimulation (manual), Patient/Family education, Balance training, Stair training, Taping, Dry Needling, Joint mobilization, Spinal mobilization, Vestibular training, Cryotherapy, and Moist  heat  PLAN FOR NEXT SESSION:  Continue to progress hip and core strengthening, specifically work on L glut activation and lumbopelvic mobility towards goals.  2:58 PM, 07/23/23 M. Shary Decamp, PT, DPT Physical Therapist- [place] Office Number: [phone]

## 2023-07-26 ENCOUNTER — Other Ambulatory Visit: Payer: Self-pay

## 2023-07-27 ENCOUNTER — Ambulatory Visit: Payer: 59

## 2023-07-27 ENCOUNTER — Other Ambulatory Visit: Payer: Self-pay

## 2023-07-27 DIAGNOSIS — R2689 Other abnormalities of gait and mobility: Secondary | ICD-10-CM

## 2023-07-27 DIAGNOSIS — M6281 Muscle weakness (generalized): Secondary | ICD-10-CM

## 2023-07-27 DIAGNOSIS — M5459 Other low back pain: Secondary | ICD-10-CM

## 2023-07-27 NOTE — Therapy (Signed)
OUTPATIENT PHYSICAL THERAPY NEURO TREATMENT NOTE and Progress Note   Patient Name: Maureen Reyes MRN: [id number] DOB:Mar 10, 1971, 52 y.o., female Today's Date: 07/27/2023   PCP: Olegario Messier, MD  REFERRING PROVIDER: Earl Lagos, MD  Progress Note Reporting Period 06/22/2023 to 07/27/2023  See note below for Objective Data and Assessment of Progress/Goals.     END OF SESSION:  PT End of Session - 07/27/23 1311     Visit Number 10    Number of Visits 13    Date for PT Re-Evaluation 08/03/23    Authorization Type UHC Medicare-Medicaid Dual Complete    Authorization - Visit Number 10    Authorization - Number of Visits 27    PT Start Time 1315    PT Stop Time 1400    PT Time Calculation (min) 45 min    Activity Tolerance Patient tolerated treatment well    Behavior During Therapy WFL for tasks assessed/performed                   Past Medical History:  Diagnosis Date   Arthritis    Chronic headaches    Colon polyps    Diabetes mellitus without complication (HCC)    HLD (hyperlipidemia)    Hot flashes 02/29/2020   Lumbar facet arthropathy    Neuropathy    Prediabetes    Sciatica of right side    Sciatica of right side 05/04/2014   Past Surgical History:  Procedure Laterality Date   COLONOSCOPY  2023   [facility]   TUBAL LIGATION  1990 pt reported   Patient Active Problem List   Diagnosis Date Noted   Genetic testing 04/20/2023   Colon polyps    Mononeuropathy 10/14/2022   Positive FIT (fecal immunochemical test) 07/24/2021   Controlled type 2 diabetes mellitus (HCC) 07/03/2021   Hypercholesteremia 07/03/2021   Chronic lower back pain 11/26/2020   Subclinical hypothyroidism 04/19/2019   Ganglion cyst 04/19/2019   Obesity (BMI 30-39.9) 12/09/2017   Atopic dermatitis 02/18/2017   White coat syndrome with high blood pressure but without hypertension 02/18/2017   Polyneuropathy 11/28/2014   Lumbar facet arthropathy 05/04/2014   Healthcare  maintenance 05/04/2014   Tobacco use disorder, mild, in sustained remission 05/04/2014    ONSET DATE: "a while"  REFERRING DIAG: G62.89 (ICD-10-CM) - Other polyneuropathy  THERAPY DIAG:  Other low back pain  Muscle weakness (generalized)  Other abnormalities of gait and mobility  Rationale for Evaluation and Treatment: Rehabilitation  SUBJECTIVE:  SUBJECTIVE STATEMENT: Back is feeling better today, attributes to improved weather, notes spasms 1-2 episodes. Notes ongoing issue of referred pain down BLE,  LLE > RLE every night.   Pt accompanied by: self  PERTINENT HISTORY: HA's, DM, HLD, neuropathy  PAIN:  Are you having pain? Yes: NPRS scale: 2-3/10 Pain location: L leg/LB and R side of midback Pain description: dull Aggravating factors: night time Relieving factors: massage, stretching her back, change positions  PRECAUTIONS: None  RED FLAGS: None   WEIGHT BEARING RESTRICTIONS: No  FALLS: Has patient fallen in last 6 months? 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, agricultural  PATIENT GOALS: improve pain   OBJECTIVE:   TODAY'S TREATMENT: 07/27/23 Activity Comments  NU-step x 8 min Warm-up x 2 min Work resistance level 5 x 4 min Cool down x 2 min  LTR 10x   Manual muscle test See chart  Bridge w/ green loop 1x10 With ankle PF and DF  inverse clam red TB 2x10 Doubled band  Deadlift 1x10, 15# -W/ mirror and 4" step for guided practice -w/out box and reduced cues--good carryover        HOME EXERCISE PROGRAM Last updated: 07/01/23 Access Code:  [email or web address] URL: [email or web address] Date: 07/01/2023 Prepared by: [facility] - Outpatient  Rehab - [facility]  Exercises - Seated Flexion Stretch with Swiss Ball  - 1 x daily - 5 x weekly - 2 sets - 10 reps - 5 sec hold - Seated Pelvic Tilts  - 1 x daily - 5 x weekly - 2 sets - 10 reps - Supine Lower Trunk Rotation  - 1 x daily - 5 x weekly - 2 sets - 10 reps - Supine Piriformis Stretch with Foot on Ground  - 1 x daily - 7 x weekly - 1 sets - 3-5 reps - 15-30 sec hold - Supine Posterior Pelvic Tilt  - 1 x daily - 7 x weekly - 1 sets - 10 reps - Supine Bridge with Resistance Band  - 1 x daily - 5 x weekly - 2 sets - 10 reps - Standing Hip Abduction with Resistance at Ankles and Counter Support  - 1 x daily - 7 x weekly - 3 sets - 10 reps - Sit to Stand with Resistance Around Legs  - 1 x daily - 7 x weekly - 3 sets - 5 reps - Modified Deadlift with Pelvic Contraction  - 1 x daily - 7 x weekly - 3 sets - 10 reps    ------------------------------------------------- Note: Objective measures below were completed at Evaluation unless otherwise noted.  DIAGNOSTIC FINDINGS: none recent  COGNITION: Overall cognitive status: Within functional limits for tasks assessed   SENSATION: Intact to light touch in B LEs   POSTURE:  anterior pelvic tilt in standing   PALPATION: very tight in B thoracolumbar paraspinals and glutes; no particular TTP  LUMBAR ROM:   Active  A/PROM  eval AROM 06/3023  Flexion ankle ankle  Extension 10% limited C/o "pop" WFL-"pressure"  Right lateral flexion Jt line WFL-pain at joint line  Left lateral flexion Jt line WFL-no issues  Right rotation 25% limited WFL  L rotation 25% limited WFL   (Blank rows = not tested)   LOWER EXTREMITY MMT:  MMT (in sitting) Right Eval Left Eval Right 07/27/23 Left  07/27/23  Hip flexion 4+ 4 5 4+  Hip extension      Hip abduction 4 4 4+ 4  Hip adduction 4 4 4 4   Hip internal rotation      Hip  external rotation      Knee flexion 4+ 4- 5 4  Knee extension 4+ 4+ 5 5  Ankle dorsiflexion 4+ 4+    Ankle plantarflexion 4+ 4+    Ankle inversion      Ankle eversion       (Blank rows = not tested)   GAIT: Gait pattern: L trunk lean and L>R hip drop Assistive device utilized: None Level of assistance: Complete Independence  FUNCTIONAL TESTS:  30M walk: 11.26 sec (2.91 ft/sec)        GOALS: Goals reviewed with patient? Yes  SHORT TERM GOALS: Target date: 07/13/2023  Patient to be independent with initial HEP. Baseline: HEP initiated Goal status: MET    LONG TERM GOALS: Target date: 08/03/2023  Patient to be independent with advanced HEP. Baseline: Not yet initiated  Goal status: IN PROGRESS  Patient to demonstrate B LE strength >/=4+/5.  Baseline: See above Goal status: IN PROGRESS  Patient to demonstrate lumbar AROM WFL and without pain limiting.  Baseline: see above Goal status: MET  Patient to report at least 5 hours of sleep uninterrupted by pain per average night.  Baseline: reports 3 hours of sleep per night; (07/27/23) unchanged Goal status: IN PROGRESS  Patient to demonstrate 25% improvement in gait deviations.  Baseline:  L trunk lean and L>R hip drop Goal status: IN PROGRESS    ASSESSMENT:  CLINICAL IMPRESSION: Reports improved back pain as of late which she attributes to change in weather.  Continued with HEP development and with emphasis on improving lumbopelvic strength and coordination to improve dynamic stability with improved ROM noted on all exercises and able to manifest improved BLE strength via manual muscle testing with RLE > LLE.  Initiated compound lift via deadlift to improve posterior chain strength/recruitment and demonstrates great body mechanics with ability to remove modifications to movement to lift object from ground level maintaining good body mechanics throughout.  Discussed benefits of general strengthening with bias to  compound lifts to improve overall strength, activity tolerance, and likely reduce LBP as well.  Lumbar spine ROM demo improvements as well with less restriction with most obvious limit being pain at joint line with right lateral flexion.   OBJECTIVE IMPAIRMENTS: Abnormal gait, decreased activity tolerance, decreased ROM, decreased strength, increased muscle spasms, impaired flexibility, improper body mechanics, postural dysfunction, and pain.   ACTIVITY LIMITATIONS: carrying, lifting, bending, standing, squatting, stairs, transfers, bed mobility, reach over head, and hygiene/grooming  PARTICIPATION LIMITATIONS: meal prep, cleaning, laundry, shopping, community activity, yard work, and church  PERSONAL FACTORS: Age, Past/current experiences, Time since onset of injury/illness/exacerbation, and 3+ comorbidities: HA's, DM, HLD, neuropathy  are also affecting patient's functional outcome.   REHAB POTENTIAL: Good  CLINICAL DECISION MAKING: Evolving/moderate complexity  EVALUATION COMPLEXITY: Moderate  PLAN:  PT FREQUENCY: 2x/week  PT DURATION: 6 weeks  PLANNED INTERVENTIONS: 97164- PT Re-evaluation, 97110-Therapeutic exercises, 97530- Therapeutic activity, [id number]- Neuromuscular re-education, 97535- Self Care, 09323- Manual therapy, [id number]- Gait training, [phone]- Canalith repositioning, [id number]- Aquatic Therapy, [phone]- Electrical stimulation (manual), Patient/Family education, Balance training, Stair training, Taping, Dry Needling, Joint mobilization, Spinal mobilization, Vestibular training, Cryotherapy, and Moist heat  PLAN FOR NEXT SESSION:   Pt will bring in band  set that she purchased,  I'm thinking use these for HEP development, e.g. paloff press, straight arm pull down, etc for core strength   1:12 PM, 07/27/23 M. Shary Decamp, PT, DPT Physical Therapist- [place] Office Number: [phone]

## 2023-07-30 ENCOUNTER — Ambulatory Visit: Payer: Medicare (Managed Care) | Admitting: Physical Therapy

## 2023-07-31 ENCOUNTER — Ambulatory Visit (INDEPENDENT_AMBULATORY_CARE_PROVIDER_SITE_OTHER): Payer: Medicare (Managed Care) | Admitting: Gastroenterology

## 2023-07-31 ENCOUNTER — Encounter: Payer: Self-pay | Admitting: Gastroenterology

## 2023-07-31 ENCOUNTER — Other Ambulatory Visit (INDEPENDENT_AMBULATORY_CARE_PROVIDER_SITE_OTHER): Payer: Medicaid Other

## 2023-07-31 VITALS — BP 120/66 | HR 75 | Ht 69.0 in | Wt 217.0 lb

## 2023-07-31 DIAGNOSIS — Z800 Family history of malignant neoplasm of digestive organs: Secondary | ICD-10-CM | POA: Diagnosis not present

## 2023-07-31 DIAGNOSIS — K859 Acute pancreatitis without necrosis or infection, unspecified: Secondary | ICD-10-CM

## 2023-07-31 DIAGNOSIS — Z139 Encounter for screening, unspecified: Secondary | ICD-10-CM

## 2023-07-31 LAB — BASIC METABOLIC PANEL
BUN: 4 mg/dL — ABNORMAL LOW (ref 6–23)
CO2: 28 meq/L (ref 19–32)
Calcium: 9.7 mg/dL (ref 8.4–10.5)
Chloride: 106 meq/L (ref 96–112)
Creatinine, Ser: 0.59 mg/dL (ref 0.40–1.20)
GFR: 102.24 mL/min (ref >60.00)
Glucose, Bld: 91 mg/dL (ref 70–99)
Potassium: 3.9 meq/L (ref 3.5–5.1)
Sodium: 142 meq/L (ref 135–145)

## 2023-07-31 NOTE — Progress Notes (Signed)
 GASTROENTEROLOGY OUTPATIENT CLINIC VISIT   Primary Care Provider Nooruddin, Saad, MD [address] [place] [address] [phone]  Patient Profile: Maureen Reyes is a 53 y.o. female with a pmh significant for diabetes, hyperlipidemia, neuropathy, headaches, colon polyps (TA's), family history of pancreatic cancer.  The patient presents to the [facility] Gastroenter[facility] for an evaluation and management of problem(s) noted below:  Problem List 1. Family history of pancreatic cancer   2. Screening procedure    Discussed the use of AI scribe software for clinical note transcription with the patient, who gave verbal consent to proceed.  History of Present Illness Please see prior GI notes for full details of HPI.  Interval History The patient presents for follow-up today in the GI clinic (she follows regularly with Dr. Federico) for discussion about entering the High-Risk Pancreatic Cancer Screening cohort.  She has a significant family history of pancreatic cancer after being evaluated by the genetics clinic.  The patient's family history includes three relatives diagnosed with pancreatic cancer. The patient has undergone genetic testing, which did not reveal any common mutations such as BRCA, ATM, or MUTYH that might predispose them to colonic polyposis or pancreatic cancer (full testing in EPIC).  The patient is currently asymptomatic and has no personal history of pancreatic disease (pancreatitis or pancreatic cysts). The patient also has a diagnosis of diabetes with a hemoglobin A1c level around 6.0.   GI Review of Systems Positive as above Negative for dysphagia, abdominal pain, melena, hematochezia  Review of Systems General: Denies fevers/chills/weight loss unintentionally Cardiovascular: Denies chest pain Pulmonary: Denies shortness of breath Gastroenterological: See HPI Genitourinary: Denies darkened urine Hematological: Denies easy  bruising/bleeding Dermatological: Denies jaundice Psychological: Mood is stable   Medications Current Outpatient Medications  Medication Sig Dispense Refill   Accu-Chek Softclix Lancets lancets Use as instructed 100 each 12   Blood Glucose Monitoring Suppl (ACCU-CHEK GUIDE) w/Device KIT Use as directed 1 kit 3   Fiber Adult Gummies 2 g CHEW Chew 2 each by mouth daily.     gabapentin  (NEURONTIN ) 100 MG capsule Take 3 capsules (300 mg total) by mouth at bedtime. 90 capsule 3   glucose blood (GNP TRUE METRIX GLUCOSE STRIPS) test strip Use as instructed to check blood sugar. 100 each 12   glucose blood test strip Use as instructed to check blood sugar. 100 each 12   Melatonin 10 MG CHEW Chew 1 each by mouth at bedtime.     metFORMIN  (GLUCOPHAGE ) 1000 MG tablet Take 1 tablet (1,000 mg total) by mouth 2 (two) times daily with a meal. 120 tablet 5   rosuvastatin  (CRESTOR ) 20 MG tablet Take 1 tablet (20 mg total) by mouth daily. 90 tablet 2   Semaglutide ,0.25 or 0.5MG /DOS, (OZEMPIC , 0.25 OR 0.5 MG/DOSE,) 2 MG/3ML SOPN Inject 0.25 mg into the skin once a week. Increase after 4 weeks if tolerating well to 0.5mg . 3 mL 2   No current facility-administered medications for this visit.    Allergies Allergies  Allergen Reactions   Hydrocodone Nausea Only    Histories Past Medical History:  Diagnosis Date   Arthritis    Chronic headaches    Colon polyps    Diabetes mellitus without complication (HCC)    HLD (hyperlipidemia)    Hot flashes 02/29/2020   Lumbar facet arthropathy    Neuropathy    Prediabetes    Sciatica of right side    Sciatica of right side 05/04/2014   Past Surgical History:  Procedure  Laterality Date   COLONOSCOPY  2023   [facility]   TUBAL LIGATION  1990 pt reported   Social History   Socioeconomic History   Marital status: Single    Spouse name: Not on file   Number of children: 3   Years of education: Not on file   Highest education level: Some college, no  degree  Occupational History   Occupation: Unemployed  Tobacco Use   Smoking status: Former    Current packs/day: 0.00    Average packs/day: 0.3 packs/day for 20.0 years (5.0 ttl pk-yrs)    Types: Cigarettes    Start date: 07/01/2000    Quit date: 07/01/2020    Years since quitting: 3.0   Smokeless tobacco: Never   Tobacco comments:    Stopped last year   Vaping Use   Vaping status: Never Used  Substance and Sexual Activity   Alcohol use: No   Drug use: No   Sexual activity: Not Currently    Birth control/protection: Surgical  Other Topics Concern   Not on file  Social History Narrative   Lives with someone, (partner)   Social Drivers of Health   Financial Resource Strain: Low Risk  (04/01/2023)   Overall Financial Resource Strain (CARDIA)    Difficulty of Paying Living Expenses: Not hard at all  Food Insecurity: No Food Insecurity (04/01/2023)   Hunger Vital Sign    Worried About Running Out of Food in the Last Year: Never true    Ran Out of Food in the Last Year: Never true  Transportation Needs: No Transportation Needs (04/01/2023)   PRAPARE - Administrator, Civil Service (Medical): No    Lack of Transportation (Non-Medical): No  Physical Activity: Insufficiently Active (04/01/2023)   Exercise Vital Sign    Days of Exercise per Week: 3 days    Minutes of Exercise per Session: 30 min  Stress: No Stress Concern Present (04/01/2023)   [employer] of Occupational Health - Occupational Stress Questionnaire    Feeling of Stress : Not at all  Social Connections: Moderately Isolated (04/01/2023)   Social Connection and Isolation Panel [NHANES]    Frequency of Communication with Friends and Family: More than three times a week    Frequency of Social Gatherings with Friends and Family: Twice a week    Attends Religious Services: Never    Database Administrator or Organizations: No    Attends Banker Meetings: Never    Marital Status: Living with partner   Intimate Partner Violence: Not At Risk (04/01/2023)   Humiliation, Afraid, Rape, and Kick questionnaire    Fear of Current or Ex-Partner: No    Emotionally Abused: No    Physically Abused: No    Sexually Abused: No   Family History  Problem Relation Age of Onset   Diabetes Mother    Diabetes Mellitus II Mother    Thyroid  disease Mother    Colon polyps Mother    Diabetes Father    Diabetes Mellitus II Father    Diabetes Maternal Aunt        several aunts and uncles   Heart disease Maternal Aunt    Pancreatic cancer Maternal Aunt    Diabetes Maternal Uncle        several aunts and uncles   Pancreatic cancer Maternal Uncle    Diabetes Paternal Aunt        several aunts and uncles   Diabetes Paternal Uncle  several aunts and uncles   Diabetes Maternal Grandmother    Pancreatic cancer Maternal Grandfather    Diabetes Maternal Grandfather    Pancreatic disease Maternal Grandfather        cancer   Diabetes Paternal Grandmother    Diabetes Paternal Grandfather    Colon cancer Neg Hx    Rectal cancer Neg Hx    Stomach cancer Neg Hx    Esophageal cancer Neg Hx    Inflammatory bowel disease Neg Hx    Liver disease Neg Hx    I have reviewed her medical, social, and family history in detail and updated the electronic medical record as necessary.    PHYSICAL EXAMINATION  BP 120/66 Comment: irregular  Pulse 75   Ht 5' 9 (1.753 m)   Wt 217 lb (98.4 kg)   SpO2 99%   BMI 32.05 kg/m  Wt Readings from Last 3 Encounters:  07/31/23 217 lb (98.4 kg)  05/18/23 222 lb 6.4 oz (100.9 kg)  04/01/23 236 lb (107 kg)  GEN: NAD, appears stated age, doesn't appear chronically ill PSYCH: Cooperative, without pressured speech EYE: Conjunctivae pink, sclerae anicteric ENT: MMM CV: Nontachycardic RESP: No audible wheezing GI: NABS, soft, NT/ND, without rebound or guarding MSK/EXT: No significant lower extremity edema SKIN: No jaundice NEURO:  Alert & Oriented x 3, no focal  deficits   REVIEW OF DATA  I reviewed the following data at the time of this encounter:  GI Procedures and Studies  These have been reviewed by her primary gastroenterologist  Laboratory Studies  Reviewed those in epic Please see genetics report for full details of negative workup  Imaging Studies  No relevant studies to review   ASSESSMENT  Ms. Dibuono is a 53 y.o. female with a pmh significant for diabetes, hyperlipidemia, neuropathy, headaches, colon polyps (TA's), family history of pancreatic cancer.  The patient is seen today for evaluation and management of:  1. Family history of pancreatic cancer   2. Screening procedure    The patient is hemodynamically and clinically stable.  Based on her family history of 3 individuals with pancreatic cancer, she meets criteria for high risk screening based on the CAPS consortium.  We had an extensive discussion about screening modalities and how we believe that it will decrease the risk of advanced pancreatic cancer from developing.  But we also discussed how this will be an increased burden of imaging and procedures that could lead to diagnostic biopsies that have risks such as pancreatitis with EUS sampling of pancreas.  The risks of an EUS including intestinal perforation, bleeding, infection, aspiration, and medication effects were discussed as was the possibility it may not give a definitive diagnosis if a biopsy is performed.  When a biopsy of the pancreas is done as part of the EUS, there is an additional risk of pancreatitis at the rate of about 1-2%.  It was explained that procedure related pancreatitis is typically mild, although it can be severe and even life threatening, which is why we do not perform random pancreatic biopsies and only biopsy a lesion/area we feel is concerning enough to warrant the risk.  After our extensive discussion the patient would like to enroll in our high risk screening cohort.  At any time point if she wishes  to discontinue screening she will certainly be allowed to do that.  We will begin with MRI/MRCP and if that looks well we will plan an EUS in 1 year.  She will follow-up with her primary  gastroenterologist for other GI issues.  All patient questions were answered to the best of my ability, and the patient agrees to the aforementioned plan of action with follow-up as indicated.   PLAN  Patient agrees to enroll in high risk pancreas cancer screening cohort Plan for MRI/MRCP - If all is well, then we will plan for EUS alternation in 1 year Patient to follow-up with primary gastroenterologist for other issues as needed in regards to her colon polyp surveillance   Orders Placed This Encounter  Procedures   MR ABDOMEN MRCP W WO CONTAST   Basic Metabolic Panel (BMET)    New Prescriptions   No medications on file   Modified Medications   No medications on file    Planned Follow Up No follow-ups on file.   Total Time in Face-to-Face and in Coordination of Care for patient including independent/personal interpretation/review of prior testing, medical history, examination, medication adjustment, communicating results with the patient directly, and documentation within the EHR is 25 minutes.   Aloha Finner, MD [facility] Office # [phone]

## 2023-07-31 NOTE — Patient Instructions (Signed)
 Your provider has requested that you go to the basement level for lab work before leaving today. Press B on the elevator. The lab is located at the first door on the left as you exit the elevator.  You have been scheduled for an MRI at [facility]  on 08/05/23. Your appointment time is 5:00 pm. Please arrive to admitting (at main entrance of the hospital) 30 minutes prior to your appointment time for registration purposes. Please make certain not to have anything to eat or drink 6 hours prior to your test. In addition, if you have any metal in your body, have a pacemaker or defibrillator, please be sure to let your ordering physician know. This test typically takes 45 minutes to 1 hour to complete. Should you need to reschedule, please call [phone] to do so.  Due to recent changes in healthcare laws, you may see the results of your imaging and laboratory studies on MyChart before your provider has had a chance to review them.  We understand that in some cases there may be results that are confusing or concerning to you. Not all laboratory results come back in the same time frame and the provider may be waiting for multiple results in order to interpret others.  Please give us  48 hours in order for your provider to thoroughly review all the results before contacting the office for clarification of your results.   _______________________________________________________  If your blood pressure at your visit was 140/90 or greater, please contact your primary care physician to follow up on this.  _______________________________________________________  If you are age 79 or older, your body mass index should be between 23-30. Your Body mass index is 32.05 kg/m. If this is out of the aforementioned range listed, please consider follow up with your Primary Care Provider.  If you are age 61 or younger, your body mass index should be between 19-25. Your Body mass index is 32.05 kg/m. If this is out of the  aformentioned range listed, please consider follow up with your Primary Care Provider.   ________________________________________________________  The [facility] providers would like to encourage you to use MYCHART to communicate with providers for non-urgent requests or questions.  Due to long hold times on the telephone, sending your provider a message by [facility] may be a faster and more efficient way to get a response.  Please allow 48 business hours for a response.  Please remember that this is for non-urgent requests.  _______________________________________________________  Thank you for choosing me and [facility].  Dr. Wilhelmenia

## 2023-08-03 ENCOUNTER — Ambulatory Visit: Payer: Medicare (Managed Care) | Admitting: Physical Therapy

## 2023-08-04 ENCOUNTER — Encounter: Payer: Self-pay | Admitting: Gastroenterology

## 2023-08-04 DIAGNOSIS — Z139 Encounter for screening, unspecified: Secondary | ICD-10-CM | POA: Insufficient documentation

## 2023-08-04 DIAGNOSIS — Z800 Family history of malignant neoplasm of digestive organs: Secondary | ICD-10-CM | POA: Insufficient documentation

## 2023-08-04 NOTE — Therapy (Signed)
 OUTPATIENT PHYSICAL THERAPY NEURO PROGRESS NOTE/RECERT   Patient Name: Maureen Reyes MRN: [id number] DOB:12/17/1970, 53 y.o., female Today's Date: 08/05/2023   PCP: Nooruddin, Saad, MD  REFERRING PROVIDER: Onesimo Claude, MD   Progress Note Reporting Period 07/30/23 to 08/05/23  See note below for Objective Data and Assessment of Progress/Goals.       END OF SESSION:  PT End of Session - 08/05/23 1357     Visit Number 11    Number of Visits 15    Date for PT Re-Evaluation 09/02/23    Authorization Type UHC Medicare-Medicaid Dual Complete    Authorization - Visit Number 11    PT Start Time 1317    PT Stop Time 1400    PT Time Calculation (min) 43 min    Activity Tolerance Patient tolerated treatment well    Behavior During Therapy WFL for tasks assessed/performed                    Past Medical History:  Diagnosis Date   Arthritis    Chronic headaches    Colon polyps    Diabetes mellitus without complication (HCC)    HLD (hyperlipidemia)    Hot flashes 02/29/2020   Lumbar facet arthropathy    Neuropathy    Prediabetes    Sciatica of right side    Sciatica of right side 05/04/2014   Past Surgical History:  Procedure Laterality Date   COLONOSCOPY  2023   [facility]   TUBAL LIGATION  1990 pt reported   Patient Active Problem List   Diagnosis Date Noted   Family history of pancreatic cancer 08/04/2023   Screening procedure 08/04/2023   Genetic testing 04/20/2023   Colon polyps    Mononeuropathy 10/14/2022   Positive FIT (fecal immunochemical test) 07/24/2021   Controlled type 2 diabetes mellitus (HCC) 07/03/2021   Hypercholesteremia 07/03/2021   Chronic lower back pain 11/26/2020   Subclinical hypothyroidism 04/19/2019   Ganglion cyst 04/19/2019   Obesity (BMI 30-39.9) 12/09/2017   Atopic dermatitis 02/18/2017   White coat syndrome with high blood pressure but without hypertension 02/18/2017   Polyneuropathy 11/28/2014   Lumbar facet  arthropathy 05/04/2014   Healthcare maintenance 05/04/2014   Tobacco use disorder, mild, in sustained remission 05/04/2014    ONSET DATE: a while  REFERRING DIAG: G62.89 (ICD-10-CM) - Other polyneuropathy  THERAPY DIAG:  Other low back pain  Muscle weakness (generalized)  Other abnormalities of gait and mobility  Rationale for Evaluation and Treatment: Rehabilitation  SUBJECTIVE:  SUBJECTIVE STATEMENT: Reports that she got her wedge which has been helping her sleep and her back pain. Wondering if she has sleep apnea. Would like to continue with PT for a few more sessions.   Pt accompanied by: self  PERTINENT HISTORY: HA's, DM, HLD, neuropathy  PAIN:  Are you having pain? Yes: NPRS scale: 1-2/10 Pain location: L LB Pain description: spasm Aggravating factors: night time Relieving factors: massage, stretching her back, change positions  PRECAUTIONS: None  RED FLAGS: None   WEIGHT BEARING RESTRICTIONS: No  FALLS: Has patient fallen in last 6 months? 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, agricultural  PATIENT GOALS: improve pain   OBJECTIVE:     TODAY'S TREATMENT: 08/04/22 Activity Comments  Discussion and demo of variety of ways pt's TB bands/pulleys can be used for to address UE strength   STS with red loop above knees 10x  Cues to avoid valgus   Sidestepping with red TB around ankles 1 min Effort to maintain tall posture   Standing hip ABD with red TB 10x each Effort to maintain tall posture   Sitting KTOS 30 each    Using pt's Green tubing  : Row Lat pulldown (seated) Horizontal ABD  Cueing for correct form            LOWER EXTREMITY MMT:     MMT (in sitting) Right Eval Left Eval Right 07/27/23 Left  07/27/23 Right 08/05/23 Left 08/05/23  Hip flexion 4+ 4 5 4+ 4+ 4+  Hip extension        Hip abduction 4 4 4+ 4 4 4   Hip adduction 4 4 4 4 4 4   Hip internal rotation        Hip external rotation        Knee flexion 4+ 4- 5 4 5 4   Knee extension 4+ 4+ 5 5 5 5   Ankle dorsiflexion 4+ 4+      Ankle plantarflexion 4+ 4+      Ankle inversion        Ankle eversion         (Blank rows = not tested)  HOME EXERCISE PROGRAM Access Code: [email or web address] URL: [email or web address] Date: 08/05/2023 Prepared by: [facility] - Outpatient  Rehab - [facility]  Exercises - Seated Flexion Stretch with Swiss Ball  - 1 x daily - 5 x weekly - 2 sets - 10 reps - 5 sec hold - Seated Pelvic Tilts  - 1 x daily - 5 x weekly - 2 sets - 10 reps - Supine Lower Trunk Rotation  - 1 x daily - 5 x weekly - 2 sets - 10 reps - Supine Piriformis Stretch with Foot on Ground  - 1 x daily - 7 x weekly - 1 sets - 3-5 reps - 15-30 sec hold - Supine Posterior Pelvic Tilt  - 1 x daily - 7 x weekly - 1 sets - 10 reps - Supine Bridge with Resistance Band  - 1 x daily - 5 x weekly - 2 sets - 10 reps - Standing Hip Abduction with Resistance at Ankles and Counter Support  - 1 x daily - 7 x weekly - 3 sets - 10 reps - Side Stepping with Resistance at Ankles and Counter  Support  - 1 x daily - 5 x weekly - 2 sets - 10 reps - Sit to Stand with Resistance Around Legs  - 1 x daily - 7 x weekly - 3 sets - 5 reps - Modified Deadlift with Pelvic Contraction  - 1 x daily - 7 x weekly - 3 sets - 10 reps - Standing Shoulder Row with Anchored Resistance  - 1 x daily - 5 x weekly - 2 sets - 10 reps - Seated Lat Pull Down with Resistance - Elbows Bent  - 1 x daily - 5 x weekly - 2 sets - 10 reps - Shoulder Horizontal Abduction Seated on Swiss Ball  - 1 x daily - 5  x weekly - 2 sets - 10 reps    PATIENT EDUCATION: Education details: POC, HEP update Person educated: Patient Education method: Explanation, Demonstration, Tactile cues, Verbal cues, and Handouts Education comprehension: verbalized understanding and returned demonstration     ------------------------------------------------- Note: Objective measures below were completed at Evaluation unless otherwise noted.  DIAGNOSTIC FINDINGS: none recent  COGNITION: Overall cognitive status: Within functional limits for tasks assessed   SENSATION: Intact to light touch in B LEs   POSTURE:  anterior pelvic tilt in standing   PALPATION: very tight in B thoracolumbar paraspinals and glutes; no particular TTP  LUMBAR ROM:   Active  A/PROM  eval AROM 06/3023  Flexion ankle ankle  Extension 10% limited C/o pop WFL-pressure  Right lateral flexion Jt line WFL-pain at joint line  Left lateral flexion Jt line WFL-no issues  Right rotation 25% limited WFL  L rotation 25% limited WFL   (Blank rows = not tested)   LOWER EXTREMITY MMT:     MMT (in sitting) Right Eval Left Eval Right 07/27/23 Left  07/27/23  Hip flexion 4+ 4 5 4+  Hip extension      Hip abduction 4 4 4+ 4  Hip adduction 4 4 4 4   Hip internal rotation      Hip external rotation      Knee flexion 4+ 4- 5 4  Knee extension 4+ 4+ 5 5  Ankle dorsiflexion 4+ 4+    Ankle plantarflexion 4+ 4+    Ankle inversion      Ankle eversion       (Blank rows = not tested)   GAIT: Gait pattern: L trunk lean and L>R hip drop Assistive device utilized: None Level of assistance: Complete Independence  FUNCTIONAL TESTS:  26M walk: 11.26 sec (2.91 ft/sec)        GOALS: Goals reviewed with patient? Yes  SHORT TERM GOALS: Target date: 07/13/2023  Patient to be independent with initial HEP. Baseline: HEP initiated Goal status: MET    LONG TERM GOALS: Target date: 09/02/2023  Patient to be independent with  advanced HEP. Baseline: Not yet initiated ; reports good understanding 08/05/23 Goal status: IN PROGRESS  08/05/23  Patient to demonstrate B LE strength >/=4+/5.  Baseline: See above; grossly unchanged 08/05/23 Goal status: IN PROGRESS  08/05/23  Patient to demonstrate lumbar AROM WFL and without pain limiting.  Baseline: see above Goal status: MET  Patient to report at least 5 hours of sleep uninterrupted by pain per average night.  Baseline: reports 3 hours of sleep per night; (07/27/23) unchanged; pt reports 5-6 hours 08/05/23 Goal status: MET 08/05/23  Patient to demonstrate 25% improvement in gait deviations.  Baseline:  L trunk lean and L>R hip drop; unchanged  08/05/23 Goal status: IN PROGRESS  08/05/23    ASSESSMENT:  CLINICAL IMPRESSION: Patient arrived to session with report of improvement in sleep duration and LBP with use of wedge at night. Now reports 5-6 hours of uninterrupted sleep. Strength testing is grossly unchanged; most remaining weakness in hips. Gait pattern reveals remaining lateral trunk lean. Session focused on education on how to use patient's resistance bands/tubing brought from home. Patient demonstrated good understanding of exercises performed today. Patient is making progress towards goals. Would benefit from additional skilled PT services 1x/week for additional 4 weeks to address remaining goals.   OBJECTIVE IMPAIRMENTS: Abnormal gait, decreased activity tolerance, decreased ROM, decreased strength, increased muscle spasms, impaired flexibility, improper body mechanics, postural dysfunction, and pain.   ACTIVITY LIMITATIONS: carrying, lifting, bending, standing, squatting, stairs, transfers, bed mobility, reach over head, and hygiene/grooming  PARTICIPATION LIMITATIONS: meal prep, cleaning, laundry, shopping, community activity, yard work, and church  PERSONAL FACTORS: Age, Past/current experiences, Time since onset of injury/illness/exacerbation, and 3+  comorbidities: HA's, DM, HLD, neuropathy  are also affecting patient's functional outcome.   REHAB POTENTIAL: Good  CLINICAL DECISION MAKING: Evolving/moderate complexity  EVALUATION COMPLEXITY: Moderate  PLAN:  PT FREQUENCY: 1x/week  PT DURATION: 4 weeks  PLANNED INTERVENTIONS: 97164- PT Re-evaluation, 97110-Therapeutic exercises, 97530- Therapeutic activity, 97112- Neuromuscular re-education, 97535- Self Care, 02859- Manual therapy, [phone]- Gait training, [phone]- Canalith repositioning, [id number]- Aquatic Therapy, [phone]- Electrical stimulation (manual), Patient/Family education, Balance training, Stair training, Taping, Dry Needling, Joint mobilization, Spinal mobilization, Vestibular training, Cryotherapy, and Moist heat  PLAN FOR NEXT SESSION:  review HEP update using pt's bands and provide additional uses such as- paloff press, straight arm pull down, etc for core strength      Louana Terrilyn Christians, PT, DPT 08/05/23 2:08 PM  [facility] [address], [address] [place], [place] [address] Phone # [phone] Fax # [phone]

## 2023-08-05 ENCOUNTER — Ambulatory Visit (HOSPITAL_COMMUNITY)
Admission: RE | Admit: 2023-08-05 | Discharge: 2023-08-05 | Disposition: A | Discharge status: Home / Self Care | Payer: Medicare (Managed Care) | Source: Ambulatory Visit | Attending: Gastroenterology | Admitting: Gastroenterology

## 2023-08-05 ENCOUNTER — Other Ambulatory Visit (HOSPITAL_COMMUNITY): Payer: Self-pay

## 2023-08-05 ENCOUNTER — Ambulatory Visit: Payer: Medicare (Managed Care) | Attending: Internal Medicine | Admitting: Physical Therapy

## 2023-08-05 ENCOUNTER — Encounter: Payer: Self-pay | Admitting: Physical Therapy

## 2023-08-05 ENCOUNTER — Other Ambulatory Visit: Payer: Self-pay | Admitting: Gastroenterology

## 2023-08-05 DIAGNOSIS — Z139 Encounter for screening, unspecified: Secondary | ICD-10-CM

## 2023-08-05 DIAGNOSIS — K859 Acute pancreatitis without necrosis or infection, unspecified: Secondary | ICD-10-CM

## 2023-08-05 DIAGNOSIS — K8020 Calculus of gallbladder without cholecystitis without obstruction: Secondary | ICD-10-CM | POA: Insufficient documentation

## 2023-08-05 DIAGNOSIS — I700 Atherosclerosis of aorta: Secondary | ICD-10-CM | POA: Diagnosis not present

## 2023-08-05 DIAGNOSIS — Z13810 Encounter for screening for upper gastrointestinal disorder: Secondary | ICD-10-CM | POA: Diagnosis present

## 2023-08-05 DIAGNOSIS — R2689 Other abnormalities of gait and mobility: Secondary | ICD-10-CM | POA: Diagnosis present

## 2023-08-05 DIAGNOSIS — M5459 Other low back pain: Secondary | ICD-10-CM | POA: Insufficient documentation

## 2023-08-05 DIAGNOSIS — Q453 Other congenital malformations of pancreas and pancreatic duct: Secondary | ICD-10-CM | POA: Diagnosis not present

## 2023-08-05 DIAGNOSIS — Z800 Family history of malignant neoplasm of digestive organs: Secondary | ICD-10-CM

## 2023-08-05 DIAGNOSIS — M6281 Muscle weakness (generalized): Secondary | ICD-10-CM | POA: Diagnosis present

## 2023-08-05 MED ORDER — GADOBUTROL 1 MMOL/ML IV SOLN
8.0000 mL | Freq: Once | INTRAVENOUS | Status: AC | PRN
  Administered 2023-08-05: 8 mL via INTRAVENOUS

## 2023-08-07 ENCOUNTER — Other Ambulatory Visit (HOSPITAL_COMMUNITY): Payer: Self-pay

## 2023-08-10 ENCOUNTER — Ambulatory Visit: Payer: Medicare (Managed Care) | Admitting: Physical Therapy

## 2023-08-10 ENCOUNTER — Encounter: Payer: Self-pay | Admitting: Physical Therapy

## 2023-08-10 DIAGNOSIS — M6281 Muscle weakness (generalized): Secondary | ICD-10-CM

## 2023-08-10 DIAGNOSIS — M5459 Other low back pain: Secondary | ICD-10-CM

## 2023-08-10 NOTE — Therapy (Signed)
 OUTPATIENT PHYSICAL THERAPY NEURO TREATMENT NOTE   Patient Name: Maureen Reyes MRN: [id number] DOB:[birth year], 53 y.o., female Today's Date: 08/10/2023   PCP: Nelia Dirks, MD  REFERRING PROVIDER: Onesimo Claude, MD        END OF SESSION:  PT End of Session - 08/10/23 1106     Visit Number 12    Number of Visits 15    Date for PT Re-Evaluation 09/02/23    Authorization Type UHC Medicare-Medicaid Dual Complete    Authorization - Visit Number 12    Authorization - Number of Visits 27    PT Start Time 1105    PT Stop Time 1143    PT Time Calculation (min) 38 min    Activity Tolerance Patient tolerated treatment well    Behavior During Therapy WFL for tasks assessed/performed                     Past Medical History:  Diagnosis Date   Arthritis    Chronic headaches    Colon polyps    Diabetes mellitus without complication (HCC)    HLD (hyperlipidemia)    Hot flashes 02/29/2020   Lumbar facet arthropathy    Neuropathy    Prediabetes    Sciatica of right side    Sciatica of right side 05/04/2014   Past Surgical History:  Procedure Laterality Date   COLONOSCOPY  2023   [facility]   TUBAL LIGATION  1990 pt reported   Patient Active Problem List   Diagnosis Date Noted   Family history of pancreatic cancer 08/04/2023   Screening procedure 08/04/2023   Genetic testing 04/20/2023   Colon polyps    Mononeuropathy 10/14/2022   Positive FIT (fecal immunochemical test) 07/24/2021   Controlled type 2 diabetes mellitus (HCC) 07/03/2021   Hypercholesteremia 07/03/2021   Chronic lower back pain 11/26/2020   Subclinical hypothyroidism 04/19/2019   Ganglion cyst 04/19/2019   Obesity (BMI 30-39.9) 12/09/2017   Atopic dermatitis 02/18/2017   White coat syndrome with high blood pressure but without hypertension 02/18/2017   Polyneuropathy 11/28/2014   Lumbar facet arthropathy 05/04/2014   Healthcare maintenance 05/04/2014   Tobacco use disorder, mild,  in sustained remission 05/04/2014    ONSET DATE: a while  REFERRING DIAG: G62.89 (ICD-10-CM) - Other polyneuropathy  THERAPY DIAG:  Other low back pain  Muscle weakness (generalized)  Rationale for Evaluation and Treatment: Rehabilitation  SUBJECTIVE:  SUBJECTIVE STATEMENT: Sleeping better.  Think things are getting better.   Pt accompanied by: self  PERTINENT HISTORY: HA's, DM, HLD, neuropathy  PAIN:  Are you having pain? Yes: NPRS scale: 2/10 Pain location: L LB Pain description: spasm Aggravating factors: night time Relieving factors: massage, stretching her back, change positions  PRECAUTIONS: None  RED FLAGS: None   WEIGHT BEARING RESTRICTIONS: No  FALLS: Has patient fallen in last 6 months? 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, agricultural  PATIENT GOALS: improve pain   OBJECTIVE:    TODAY'S TREATMENT: 08/10/2023 Activity Comments  Using Green band: Row Lat pulldown (seated) Horizontal ABD  Reviewed HEP-cues for technique  Paloff press, red band x 10 reps Band at door  Paloff press, at weight rack, 10 reps 15#  At weight rack:  resisted forward walk, side step R and sidestep L, 3 reps 15#, cues for abdominal activation  Step taps forward to 6, 12 step, the side step taps Alt UE lifts x 10  -Resisted sidestepping 2 minutes -Resisted monster walk forward/back, 3 reps at counter -Fwd/back march along counter, 3 reps 3# BLE     TODAY'S TREATMENT: 08/04/22 Activity Comments  Discussion and demo of variety of ways pt's TB bands/pulleys can  be used for to address UE strength   STS with red loop above knees 10x  Cues to avoid valgus   Sidestepping with red TB around ankles 1 min Effort to maintain tall posture   Standing hip ABD with red TB 10x each Effort to maintain tall posture   Sitting KTOS 30 each    Using pt's Green tubing : Row Lat pulldown (seated) Horizontal ABD  Cueing for correct form            LOWER EXTREMITY MMT:     MMT (in sitting) Right Eval Left Eval Right 07/27/23 Left  07/27/23 Right 08/05/23 Left 08/05/23  Hip flexion 4+ 4 5 4+ 4+ 4+  Hip extension        Hip abduction 4 4 4+ 4 4 4   Hip adduction 4 4 4 4 4 4   Hip internal rotation        Hip external rotation        Knee flexion 4+ 4- 5 4 5 4   Knee extension 4+ 4+ 5 5 5 5   Ankle dorsiflexion 4+ 4+      Ankle plantarflexion 4+ 4+      Ankle inversion        Ankle eversion         (Blank rows = not tested)  HOME EXERCISE PROGRAM Access Code: [email or web address] URL: [email or web address] Date: 08/05/2023 Prepared by: [facility] - Outpatient  Rehab - [facility]  Exercises - Seated Flexion Stretch with Swiss Ball  - 1 x daily - 5 x weekly - 2 sets - 10 reps - 5 sec hold - Seated Pelvic Tilts  - 1 x daily - 5 x weekly - 2 sets - 10 reps - Supine Lower Trunk Rotation  - 1 x daily - 5 x weekly - 2 sets - 10 reps - Supine Piriformis Stretch with Foot on Ground  - 1 x daily - 7 x weekly - 1 sets - 3-5 reps - 15-30 sec  hold - Supine Posterior Pelvic Tilt  - 1 x daily - 7 x weekly - 1 sets - 10 reps - Supine Bridge with Resistance Band  - 1 x daily - 5 x weekly - 2 sets - 10 reps - Standing Hip Abduction with Resistance at Ankles and Counter Support  - 1 x daily - 7 x weekly - 3 sets - 10 reps - Side Stepping with Resistance at Ankles and Counter Support  - 1 x daily - 5 x weekly - 2 sets - 10 reps - Sit to Stand with Resistance Around Legs  - 1 x daily - 7 x weekly - 3 sets - 5 reps - Modified Deadlift with Pelvic Contraction  - 1 x  daily - 7 x weekly - 3 sets - 10 reps - Standing Shoulder Row with Anchored Resistance  - 1 x daily - 5 x weekly - 2 sets - 10 reps - Seated Lat Pull Down with Resistance - Elbows Bent  - 1 x daily - 5 x weekly - 2 sets - 10 reps - Shoulder Horizontal Abduction Seated on Swiss Ball  - 1 x daily - 5 x weekly - 2 sets - 10 reps    PATIENT EDUCATION: Education details:08/10/2023:  Continue current HEP; abdominal activation with standing ex and with daily activities Person educated: Patient Education method: Explanation, Demonstration, Tactile cues, Verbal cues, and Handouts Education comprehension: verbalized understanding and returned demonstration     ------------------------------------------------- Note: Objective measures below were completed at Evaluation unless otherwise noted.  DIAGNOSTIC FINDINGS: none recent  COGNITION: Overall cognitive status: Within functional limits for tasks assessed   SENSATION: Intact to light touch in B LEs   POSTURE:  anterior pelvic tilt in standing   PALPATION: very tight in B thoracolumbar paraspinals and glutes; no particular TTP  LUMBAR ROM:   Active  A/PROM  eval AROM 06/3023  Flexion ankle ankle  Extension 10% limited C/o pop WFL-pressure  Right lateral flexion Jt line WFL-pain at joint line  Left lateral flexion Jt line WFL-no issues  Right rotation 25% limited WFL  L rotation 25% limited WFL   (Blank rows = not tested)   LOWER EXTREMITY MMT:     MMT (in sitting) Right Eval Left Eval Right 07/27/23 Left  07/27/23  Hip flexion 4+ 4 5 4+  Hip extension      Hip abduction 4 4 4+ 4  Hip adduction 4 4 4 4   Hip internal rotation      Hip external rotation      Knee flexion 4+ 4- 5 4  Knee extension 4+ 4+ 5 5  Ankle dorsiflexion 4+ 4+    Ankle plantarflexion 4+ 4+    Ankle inversion      Ankle eversion       (Blank rows = not tested)   GAIT: Gait pattern: L trunk lean and L>R hip drop Assistive device utilized:  None Level of assistance: Complete Independence  FUNCTIONAL TESTS:  13M walk: 11.26 sec (2.91 ft/sec)        GOALS: Goals reviewed with patient? Yes  SHORT TERM GOALS: Target date: 07/13/2023  Patient to be independent with initial HEP. Baseline: HEP initiated Goal status: MET    LONG TERM GOALS: Target date: 09/02/2023  Patient to be independent with advanced HEP. Baseline: Not yet initiated ; reports good understanding 08/05/23 Goal status: IN PROGRESS  08/05/23  Patient to demonstrate B LE strength >/=4+/5.  Baseline: See above; grossly unchanged 08/05/23  Goal status: IN PROGRESS  08/05/23  Patient to demonstrate lumbar AROM WFL and without pain limiting.  Baseline: see above Goal status: MET  Patient to report at least 5 hours of sleep uninterrupted by pain per average night.  Baseline: reports 3 hours of sleep per night; (07/27/23) unchanged; pt reports 5-6 hours 08/05/23 Goal status: MET 08/05/23  Patient to demonstrate 25% improvement in gait deviations.  Baseline:  L trunk lean and L>R hip drop; unchanged  08/05/23 Goal status: IN PROGRESS  08/05/23    ASSESSMENT:  CLINICAL IMPRESSION: Skilled PT session reviewed updates to HEP, with pt return demo overall good form, with minor cues for technique and for abdominal activation.  She continues to report improved pain and improved sleep.  Worked on resisted gait as well as resisted standing activities, with cues throughout for abdominal activation; pt demo improved posture and decreased lateral lean with cues for abdominal activation.  She will continue to benefit from skilled PT to progress towards goals for improved functional mobility and strength.  OBJECTIVE IMPAIRMENTS: Abnormal gait, decreased activity tolerance, decreased ROM, decreased strength, increased muscle spasms, impaired flexibility, improper body mechanics, postural dysfunction, and pain.   ACTIVITY LIMITATIONS: carrying, lifting, bending, standing, squatting,  stairs, transfers, bed mobility, reach over head, and hygiene/grooming  PARTICIPATION LIMITATIONS: meal prep, cleaning, laundry, shopping, community activity, yard work, and church  PERSONAL FACTORS: Age, Past/current experiences, Time since onset of injury/illness/exacerbation, and 3+ comorbidities: HA's, DM, HLD, neuropathy  are also affecting patient's functional outcome.   REHAB POTENTIAL: Good  CLINICAL DECISION MAKING: Evolving/moderate complexity  EVALUATION COMPLEXITY: Moderate  PLAN:  PT FREQUENCY: 1x/week  PT DURATION: 4 weeks  PLANNED INTERVENTIONS: 97164- PT Re-evaluation, 97110-Therapeutic exercises, 97530- Therapeutic activity, [id number]- Neuromuscular re-education, 97535- Self Care, 02859- Manual therapy, [id number]- Gait training, [phone]- Canalith repositioning, [id number]- Aquatic Therapy, [phone]- Electrical stimulation (manual), Patient/Family education, Balance training, Stair training, Taping, Dry Needling, Joint mobilization, Spinal mobilization, Vestibular training, Cryotherapy, and Moist heat  PLAN FOR NEXT SESSION:  Continue to review HEP update using pt's bands and provide additional uses such as- paloff press, straight arm pull down, etc for core strength.  Resisted standing exercise     Greig Anon, PT 08/10/23 11:52 AM Phone: [phone] Fax: [phone]  [facility] at [facility] [address] [place], [address] [place], [place] [address] Phone # [phone] Fax # [phone]

## 2023-08-13 ENCOUNTER — Other Ambulatory Visit (HOSPITAL_COMMUNITY): Payer: Self-pay

## 2023-08-13 DIAGNOSIS — M549 Dorsalgia, unspecified: Secondary | ICD-10-CM | POA: Diagnosis not present

## 2023-08-13 DIAGNOSIS — E6609 Other obesity due to excess calories: Secondary | ICD-10-CM | POA: Diagnosis not present

## 2023-08-13 DIAGNOSIS — Z79899 Other long term (current) drug therapy: Secondary | ICD-10-CM | POA: Diagnosis not present

## 2023-08-13 DIAGNOSIS — Z6831 Body mass index (BMI) 31.0-31.9, adult: Secondary | ICD-10-CM | POA: Diagnosis not present

## 2023-08-13 MED ORDER — METHOCARBAMOL 750 MG PO TABS
750.0000 mg | ORAL_TABLET | Freq: Three times a day (TID) | ORAL | 0 refills | Status: DC
  Filled 2023-08-13 (×2): qty 21, 7d supply, fill #0

## 2023-08-13 MED ORDER — OXYCODONE-ACETAMINOPHEN 5-325 MG PO TABS
1.0000 | ORAL_TABLET | Freq: Every day | ORAL | 0 refills | Status: DC
  Filled 2023-08-13: qty 7, 7d supply, fill #0

## 2023-08-13 MED ORDER — NAPROXEN 500 MG PO TABS
500.0000 mg | ORAL_TABLET | Freq: Two times a day (BID) | ORAL | 0 refills | Status: DC
  Filled 2023-08-13: qty 14, 7d supply, fill #0

## 2023-08-17 ENCOUNTER — Encounter: Payer: Self-pay | Admitting: Physical Therapy

## 2023-08-17 ENCOUNTER — Ambulatory Visit: Payer: Medicare (Managed Care) | Admitting: Physical Therapy

## 2023-08-17 DIAGNOSIS — M5459 Other low back pain: Secondary | ICD-10-CM

## 2023-08-17 DIAGNOSIS — M6281 Muscle weakness (generalized): Secondary | ICD-10-CM

## 2023-08-17 DIAGNOSIS — Z79899 Other long term (current) drug therapy: Secondary | ICD-10-CM | POA: Diagnosis not present

## 2023-08-17 NOTE — Therapy (Signed)
OUTPATIENT PHYSICAL THERAPY NEURO TREATMENT NOTE   Patient Name: Maureen Reyes MRN: [id number] DOB:02-12-1971, 53 y.o., female Today's Date: 08/17/2023   PCP: Olegario Messier, MD  REFERRING PROVIDER: Earl Lagos, MD        END OF SESSION:  PT End of Session - 08/17/23 1407     Visit Number 13    Number of Visits 15    Date for PT Re-Evaluation 09/02/23    Authorization Type UHC Medicare-Medicaid Dual Complete    Authorization - Visit Number 13    Authorization - Number of Visits 27    PT Start Time 1405    PT Stop Time 1443    PT Time Calculation (min) 38 min    Activity Tolerance Patient tolerated treatment well    Behavior During Therapy WFL for tasks assessed/performed                      Past Medical History:  Diagnosis Date   Arthritis    Chronic headaches    Colon polyps    Diabetes mellitus without complication (HCC)    HLD (hyperlipidemia)    Hot flashes 02/29/2020   Lumbar facet arthropathy    Neuropathy    Prediabetes    Sciatica of right side    Sciatica of right side 05/04/2014   Past Surgical History:  Procedure Laterality Date   COLONOSCOPY  2023   [facility]   TUBAL LIGATION  1990 pt reported   Patient Active Problem List   Diagnosis Date Noted   Family history of pancreatic cancer 08/04/2023   Screening procedure 08/04/2023   Genetic testing 04/20/2023   Colon polyps    Mononeuropathy 10/14/2022   Positive FIT (fecal immunochemical test) 07/24/2021   Controlled type 2 diabetes mellitus (HCC) 07/03/2021   Hypercholesteremia 07/03/2021   Chronic lower back pain 11/26/2020   Subclinical hypothyroidism 04/19/2019   Ganglion cyst 04/19/2019   Obesity (BMI 30-39.9) 12/09/2017   Atopic dermatitis 02/18/2017   White coat syndrome with high blood pressure but without hypertension 02/18/2017   Polyneuropathy 11/28/2014   Lumbar facet arthropathy 05/04/2014   Healthcare maintenance 05/04/2014   Tobacco use disorder,  mild, in sustained remission 05/04/2014    ONSET DATE: "a while"  REFERRING DIAG: G62.89 (ICD-10-CM) - Other polyneuropathy  THERAPY DIAG:  Other low back pain  Muscle weakness (generalized)  Rationale for Evaluation and Treatment: Rehabilitation  SUBJECTIVE:  SUBJECTIVE STATEMENT: Had a moment where I stretched my back and my right side of back was hurting earlier today, but it's better now.  Continue to feel pain is better.  Pt accompanied by: self  PERTINENT HISTORY: HA's, DM, HLD, neuropathy  PAIN:  Are you having pain? Yes: NPRS scale: 1-5/10 Pain location: L LB Pain description: ache that comes and goes Aggravating factors: night time Relieving factors: massage, stretching her back, change positions  PRECAUTIONS: None  RED FLAGS: None   WEIGHT BEARING RESTRICTIONS: No  FALLS: Has patient fallen in last 6 months? 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, agricultural  PATIENT GOALS: improve pain   OBJECTIVE:    TODAY'S TREATMENT: 08/17/2023 Activity Comments  Reviewed stretches and HEP: -seated forward stretch -seated pelvic tilts -supine trunk rotation -piriformis stretch -bridge with green band  KEEP  KEEP KEEP KEEP  Supine hooklying hip abduction green band x 10 reps   Seated abdominal activation: Alt UE lifts x 5 BUE lifts x 5 BUE lift/trunk rotation x 5 Alt marching x 5 Alt marching/UE lifts Good form  Suitcase carry, 3 x 20 ft, each arm carry 5#  -Resisted sidestepping 2 minutes -Resisted monster walk forward/back,2 min at  counter -Fwd/back march along counter, 2 min 3# ankle weights         Access Code: [email or web address] URL: [email or web address] Date: 08/17/2023 Prepared by: [facility] - Outpatient  Rehab - [facility]  Exercises - Seated Flexion Stretch with Swiss Ball  - 1 x daily - 5 x weekly - 2 sets - 10 reps - 5 sec hold - Supine Lower Trunk Rotation  - 1 x daily - 5 x weekly - 2 sets - 10 reps - Supine Piriformis Stretch with Foot on Ground  - 1 x daily - 7 x weekly - 1 sets - 3-5 reps - 15-30 sec hold - Supine Bridge with Resistance Band  - 1 x daily - 5 x weekly - 2 sets - 10 reps - Standing Hip Abduction with Resistance at Ankles and Counter Support  - 1 x daily - 7 x weekly - 3 sets - 10 reps - Side Stepping with Resistance at Ankles and Counter Support  - 1 x daily - 5 x weekly - 2 sets - 10 reps - Sit to Stand with Resistance Around Legs  - 1 x daily - 7 x weekly - 3 sets - 5 reps - Modified Deadlift with Pelvic Contraction  - 1 x daily - 7 x weekly - 3 sets - 10 reps - Standing Shoulder Row with Anchored Resistance  - 1 x daily - 5 x weekly - 2 sets - 10 reps - Seated Lat Pull Down with Resistance - Elbows Bent  - 1 x daily - 5 x weekly - 2 sets - 10 reps - Shoulder Horizontal Abduction Seated on Swiss Ball  - 1 x daily - 5 x weekly - 2 sets - 10 reps - Kettlebell Suitcase Carry  - 1 x daily - 7 x weekly - 1 sets - 3 reps    PATIENT EDUCATION: Education details:08/17/2023:  Updates/modifications to HEP; abdominal activation with standing ex and with daily activities Person educated: Patient Education method: Explanation, Demonstration, Tactile cues, Verbal cues,  and Handouts Education comprehension: verbalized understanding and returned demonstration     ------------------------------------------------- Note: Objective measures below were completed at Evaluation unless otherwise noted.  DIAGNOSTIC FINDINGS: none recent  COGNITION: Overall cognitive status: Within  functional limits for tasks assessed   SENSATION: Intact to light touch in B LEs   POSTURE:  anterior pelvic tilt in standing   PALPATION: very tight in B thoracolumbar paraspinals and glutes; no particular TTP  LUMBAR ROM:   Active  A/PROM  eval AROM 06/3023  Flexion ankle ankle  Extension 10% limited C/o "pop" WFL-"pressure"  Right lateral flexion Jt line WFL-pain at joint line  Left lateral flexion Jt line WFL-no issues  Right rotation 25% limited WFL  L rotation 25% limited WFL   (Blank rows = not tested)   LOWER EXTREMITY MMT:     MMT (in sitting) Right Eval Left Eval Right 07/27/23 Left  07/27/23  Hip flexion 4+ 4 5 4+  Hip extension      Hip abduction 4 4 4+ 4  Hip adduction 4 4 4 4   Hip internal rotation      Hip external rotation      Knee flexion 4+ 4- 5 4  Knee extension 4+ 4+ 5 5  Ankle dorsiflexion 4+ 4+    Ankle plantarflexion 4+ 4+    Ankle inversion      Ankle eversion       (Blank rows = not tested)   GAIT: Gait pattern: L trunk lean and L>R hip drop Assistive device utilized: None Level of assistance: Complete Independence  FUNCTIONAL TESTS:  47M walk: 11.26 sec (2.91 ft/sec)        GOALS: Goals reviewed with patient? Yes  SHORT TERM GOALS: Target date: 07/13/2023  Patient to be independent with initial HEP. Baseline: HEP initiated Goal status: MET    LONG TERM GOALS: Target date: 09/02/2023  Patient to be independent with advanced HEP. Baseline: Not yet initiated ; reports good understanding 08/05/23 Goal status: IN PROGRESS  08/05/23  Patient to demonstrate B LE strength >/=4+/5.  Baseline: See above; grossly unchanged 08/05/23 Goal status: IN PROGRESS  08/05/23  Patient to demonstrate lumbar AROM WFL and without pain limiting.  Baseline: see above Goal status: MET  Patient to report at least 5 hours of sleep uninterrupted by pain per average night.  Baseline: reports 3 hours of sleep per night; (07/27/23) unchanged; pt  reports 5-6 hours 08/05/23 Goal status: MET 08/05/23  Patient to demonstrate 25% improvement in gait deviations.  Baseline:  L trunk lean and L>R hip drop; unchanged  08/05/23 Goal status: IN PROGRESS  08/05/23    ASSESSMENT:  CLINICAL IMPRESSION: Pt presents today with report of R sided mid-back pain today, but it has subsided.  She continues to report improved pain overall. Skilled PT session focused on reviewing HEP, especially the initial supine and seated exercises; took out pelvic tilts, as pt is quite mobile in lumbar spine at this point and reinforced with patient lumbar spine neutral with abdominal activation and other exercises. Pt needs cues initially for technique, and then is able to demo good form, with no c/o pain.  Added to HEP for dynamic gait with weighted carry to address core stability with dynamic activities.  Pt will continue to benefit from skilled PT towards goals for improved functional mobility and continued decreased pain.   OBJECTIVE IMPAIRMENTS: Abnormal gait, decreased activity tolerance, decreased ROM, decreased strength, increased muscle spasms, impaired flexibility, improper body mechanics, postural dysfunction, and  pain.   ACTIVITY LIMITATIONS: carrying, lifting, bending, standing, squatting, stairs, transfers, bed mobility, reach over head, and hygiene/grooming  PARTICIPATION LIMITATIONS: meal prep, cleaning, laundry, shopping, community activity, yard work, and church  PERSONAL FACTORS: Age, Past/current experiences, Time since onset of injury/illness/exacerbation, and 3+ comorbidities: HA's, DM, HLD, neuropathy  are also affecting patient's functional outcome.   REHAB POTENTIAL: Good  CLINICAL DECISION MAKING: Evolving/moderate complexity  EVALUATION COMPLEXITY: Moderate  PLAN:  PT FREQUENCY: 1x/week  PT DURATION: 4 weeks  PLANNED INTERVENTIONS: 97164- PT Re-evaluation, 97110-Therapeutic exercises, 97530- Therapeutic activity, [id number]- Neuromuscular  re-education, 97535- Self Care, [id number]- Manual therapy, [id number]- Gait training, [phone]- Canalith repositioning, [id number]- Aquatic Therapy, [phone]- Electrical stimulation (manual), Patient/Family education, Balance training, Stair training, Taping, Dry Needling, Joint mobilization, Spinal mobilization, Vestibular training, Cryotherapy, and Moist heat  PLAN FOR NEXT SESSION:  Review HEP update; continue using pt's bands and provide additional uses such as- paloff press, straight arm pull down, etc for core strength.  Resisted standing exercise     Lonia Blood, PT 08/17/23 4:56 PM Phone: [phone] Fax: [phone]  [facility] Outpatient Rehab at [facility] [address] [place], [place] [address] Phone # [phone] Fax # [phone]

## 2023-08-20 ENCOUNTER — Other Ambulatory Visit (HOSPITAL_COMMUNITY): Payer: Self-pay

## 2023-08-20 DIAGNOSIS — Z79899 Other long term (current) drug therapy: Secondary | ICD-10-CM | POA: Diagnosis not present

## 2023-08-20 MED ORDER — METHOCARBAMOL 750 MG PO TABS
750.0000 mg | ORAL_TABLET | Freq: Three times a day (TID) | ORAL | 0 refills | Status: DC
  Filled 2023-08-20: qty 42, 14d supply, fill #0

## 2023-08-20 MED ORDER — OXYCODONE-ACETAMINOPHEN 5-325 MG PO TABS
1.0000 | ORAL_TABLET | Freq: Every evening | ORAL | 0 refills | Status: DC
  Filled 2023-08-20: qty 14, 14d supply, fill #0

## 2023-08-24 ENCOUNTER — Ambulatory Visit: Payer: Medicare (Managed Care) | Admitting: Physical Therapy

## 2023-08-24 DIAGNOSIS — Z79899 Other long term (current) drug therapy: Secondary | ICD-10-CM | POA: Diagnosis not present

## 2023-08-24 NOTE — Therapy (Incomplete)
OUTPATIENT PHYSICAL THERAPY NEURO TREATMENT NOTE   Patient Name: Maureen Reyes MRN: [id number] DOB:[birth year], 53 y.o., female Today's Date: 08/24/2023   PCP: Olegario Messier, MD  REFERRING PROVIDER: Earl Lagos, MD        END OF SESSION:             Past Medical History:  Diagnosis Date   Arthritis    Chronic headaches    Colon polyps    Diabetes mellitus without complication (HCC)    HLD (hyperlipidemia)    Hot flashes 02/29/2020   Lumbar facet arthropathy    Neuropathy    Prediabetes    Sciatica of right side    Sciatica of right side 05/04/2014   Past Surgical History:  Procedure Laterality Date   COLONOSCOPY  2023   [facility]   TUBAL LIGATION  1990 pt reported   Patient Active Problem List   Diagnosis Date Noted   Family history of pancreatic cancer 08/04/2023   Screening procedure 08/04/2023   Genetic testing 04/20/2023   Colon polyps    Mononeuropathy 10/14/2022   Positive FIT (fecal immunochemical test) 07/24/2021   Controlled type 2 diabetes mellitus (HCC) 07/03/2021   Hypercholesteremia 07/03/2021   Chronic lower back pain 11/26/2020   Subclinical hypothyroidism 04/19/2019   Ganglion cyst 04/19/2019   Obesity (BMI 30-39.9) 12/09/2017   Atopic dermatitis 02/18/2017   White coat syndrome with high blood pressure but without hypertension 02/18/2017   Polyneuropathy 11/28/2014   Lumbar facet arthropathy 05/04/2014   Healthcare maintenance 05/04/2014   Tobacco use disorder, mild, in sustained remission 05/04/2014    ONSET DATE: "a while"  REFERRING DIAG: G62.89 (ICD-10-CM) - Other polyneuropathy  THERAPY DIAG:  No diagnosis found.  Rationale for Evaluation and Treatment: Rehabilitation  SUBJECTIVE:                                                                                                                                                                                             SUBJECTIVE STATEMENT: ***Had a moment  where I stretched my back and my right side of back was hurting earlier today, but it's better now.  Continue to feel pain is better.  Pt accompanied by: self  PERTINENT HISTORY: HA's, DM, HLD, neuropathy  PAIN:  Are you having pain? Yes: NPRS scale: 1-5/10 Pain location: L LB Pain description: ache that comes and goes Aggravating factors: night time Relieving factors: massage, stretching her back, change positions  PRECAUTIONS: None  RED FLAGS: None   WEIGHT BEARING RESTRICTIONS: No  FALLS: Has patient fallen in last 6 months? 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, agricultural  PATIENT GOALS: improve pain   OBJECTIVE:    TODAY'S TREATMENT: 08/24/2023 Activity Comments                      TODAY'S TREATMENT: 08/17/2023 Activity Comments  Reviewed stretches and HEP: -seated forward stretch -seated pelvic tilts -supine trunk rotation -piriformis stretch -bridge with green band  KEEP  KEEP KEEP KEEP  Supine hooklying hip abduction green band x 10 reps   Seated abdominal activation: Alt UE lifts x 5 BUE lifts x 5 BUE lift/trunk rotation x 5 Alt marching x 5 Alt marching/UE lifts Good form  Suitcase carry, 3 x 20 ft, each arm carry 5#  -Resisted sidestepping 2 minutes -Resisted monster walk forward/back,2 min at counter -Fwd/back march along counter, 2 min 3# ankle weights         Access Code: [email or web address] URL: [email or web address] Date: 08/17/2023 Prepared by: [facility] - Outpatient  Rehab - [facility]  Exercises - Seated Flexion Stretch with Swiss Ball  - 1 x daily - 5 x weekly - 2 sets - 10 reps - 5 sec hold - Supine Lower Trunk Rotation  - 1 x daily - 5 x weekly - 2 sets - 10 reps - Supine Piriformis  Stretch with Foot on Ground  - 1 x daily - 7 x weekly - 1 sets - 3-5 reps - 15-30 sec hold - Supine Bridge with Resistance Band  - 1 x daily - 5 x weekly - 2 sets - 10 reps - Standing Hip Abduction with Resistance at Ankles and Counter Support  - 1 x daily - 7 x weekly - 3 sets - 10 reps - Side Stepping with Resistance at Ankles and Counter Support  - 1 x daily - 5 x weekly - 2 sets - 10 reps - Sit to Stand with Resistance Around Legs  - 1 x daily - 7 x weekly - 3 sets - 5 reps - Modified Deadlift with Pelvic Contraction  - 1 x daily - 7 x weekly - 3 sets - 10 reps - Standing Shoulder Row with Anchored Resistance  - 1 x daily - 5 x weekly - 2 sets - 10 reps - Seated Lat Pull Down with Resistance - Elbows Bent  - 1 x daily - 5 x weekly - 2 sets - 10 reps - Shoulder Horizontal Abduction Seated on Swiss Ball  - 1 x daily - 5 x weekly - 2 sets - 10 reps - Kettlebell Suitcase Carry  - 1 x daily - 7 x weekly - 1 sets - 3 reps    PATIENT EDUCATION: Education details:08/17/2023:  Updates/modifications to HEP; abdominal activation with standing ex and with daily activities Person educated: Patient Education method: Explanation, Demonstration, Tactile cues, Verbal cues, and Handouts Education comprehension: verbalized understanding and returned demonstration     ------------------------------------------------- Note: Objective measures below were completed at Evaluation unless otherwise noted.  DIAGNOSTIC FINDINGS: none recent  COGNITION: Overall cognitive status: Within functional limits for tasks assessed   SENSATION: Intact to light touch in B LEs   POSTURE:  anterior pelvic tilt in standing   PALPATION: very tight in B thoracolumbar paraspinals and glutes; no particular TTP  LUMBAR ROM:   Active  A/PROM  eval AROM 06/3023  Flexion ankle ankle  Extension 10% limited C/o "pop" WFL-"pressure"  Right lateral flexion Jt line WFL-pain at joint line  Left lateral flexion Jt line  WFL-no issues  Right rotation 25% limited WFL  L rotation 25% limited WFL   (Blank rows = not tested)   LOWER EXTREMITY MMT:     MMT (in sitting) Right Eval Left Eval Right 07/27/23 Left  07/27/23  Hip flexion 4+ 4 5 4+  Hip extension      Hip abduction 4 4 4+ 4  Hip adduction 4 4 4 4   Hip internal rotation      Hip external rotation      Knee flexion 4+ 4- 5 4  Knee extension 4+ 4+ 5 5  Ankle dorsiflexion 4+ 4+    Ankle plantarflexion 4+ 4+    Ankle inversion      Ankle eversion       (Blank rows = not tested)   GAIT: Gait pattern: L trunk lean and L>R hip drop Assistive device utilized: None Level of assistance: Complete Independence  FUNCTIONAL TESTS:  66M walk: 11.26 sec (2.91 ft/sec)        GOALS: Goals reviewed with patient? Yes  SHORT TERM GOALS: Target date: 07/13/2023  Patient to be independent with initial HEP. Baseline: HEP initiated Goal status: MET    LONG TERM GOALS: Target date: 09/02/2023  Patient to be independent with advanced HEP. Baseline: Not yet initiated ; reports good understanding 08/05/23 Goal status: IN PROGRESS  08/05/23  Patient to demonstrate B LE strength >/=4+/5.  Baseline: See above; grossly unchanged 08/05/23 Goal status: IN PROGRESS  08/05/23  Patient to demonstrate lumbar AROM WFL and without pain limiting.  Baseline: see above Goal status: MET  Patient to report at least 5 hours of sleep uninterrupted by pain per average night.  Baseline: reports 3 hours of sleep per night; (07/27/23) unchanged; pt reports 5-6 hours 08/05/23 Goal status: MET 08/05/23  Patient to demonstrate 25% improvement in gait deviations.  Baseline:  L trunk lean and L>R hip drop; unchanged  08/05/23 Goal status: IN PROGRESS  08/05/23    ASSESSMENT:  CLINICAL IMPRESSION: ***Pt presents today with report of R sided mid-back pain today, but it has subsided.  She continues to report improved pain overall. Skilled PT session focused on reviewing HEP,  especially the initial supine and seated exercises; took out pelvic tilts, as pt is quite mobile in lumbar spine at this point and reinforced with patient lumbar spine neutral with abdominal activation and other exercises. Pt needs cues initially for technique, and then is able to demo good form, with no c/o pain.  Added to HEP for dynamic gait with weighted carry to address core stability with dynamic activities.  Pt will continue to benefit from skilled PT towards goals for improved functional mobility and continued decreased pain.   OBJECTIVE IMPAIRMENTS: Abnormal gait, decreased activity tolerance, decreased ROM, decreased strength, increased muscle spasms, impaired flexibility, improper body mechanics, postural dysfunction, and pain.   ACTIVITY LIMITATIONS: carrying, lifting, bending, standing, squatting, stairs, transfers, bed mobility, reach over head, and hygiene/grooming  PARTICIPATION LIMITATIONS: meal prep, cleaning, laundry, shopping, community activity, yard work, and church  PERSONAL FACTORS: Age, Past/current experiences, Time since onset of injury/illness/exacerbation, and 3+ comorbidities: HA's, DM, HLD, neuropathy  are also affecting patient's functional outcome.   REHAB POTENTIAL: Good  CLINICAL DECISION MAKING: Evolving/moderate complexity  EVALUATION COMPLEXITY: Moderate  PLAN:  PT FREQUENCY: 1x/week  PT DURATION: 4  weeks  PLANNED INTERVENTIONS: 97164- PT Re-evaluation, 97110-Therapeutic exercises, 97530- Therapeutic activity, [id number]- Neuromuscular re-education, 97535- Self Care, 16109- Manual therapy, [phone]- Gait training, [phone]- Canalith repositioning, [phone]- Aquatic Therapy, [phone]- Electrical stimulation (manual), Patient/Family education, Balance training, Stair training, Taping, Dry Needling, Joint mobilization, Spinal mobilization, Vestibular training, Cryotherapy, and Moist heat  PLAN FOR NEXT SESSION:  ***Review HEP update; continue using pt's bands and provide  additional uses such as- paloff press, straight arm pull down, etc for core strength.  Resisted standing exercise     Lonia Blood, PT 08/24/23 8:11 AM Phone: [phone] Fax: [phone]  [facility] Health Outpatient Rehab at [facility] [address] [place], [address] [place], [place] [address] Phone # [phone] Fax # [phone]

## 2023-08-26 ENCOUNTER — Other Ambulatory Visit (HOSPITAL_COMMUNITY): Payer: Self-pay

## 2023-08-26 ENCOUNTER — Ambulatory Visit (INDEPENDENT_AMBULATORY_CARE_PROVIDER_SITE_OTHER): Payer: Medicaid Other | Admitting: Internal Medicine

## 2023-08-26 VITALS — BP 123/84 | HR 60 | Temp 98.4°F | Ht 69.0 in | Wt 216.4 lb

## 2023-08-26 DIAGNOSIS — E7800 Pure hypercholesterolemia, unspecified: Secondary | ICD-10-CM

## 2023-08-26 DIAGNOSIS — Z0000 Encounter for general adult medical examination without abnormal findings: Secondary | ICD-10-CM

## 2023-08-26 DIAGNOSIS — E119 Type 2 diabetes mellitus without complications: Secondary | ICD-10-CM | POA: Diagnosis not present

## 2023-08-26 DIAGNOSIS — K089 Disorder of teeth and supporting structures, unspecified: Secondary | ICD-10-CM

## 2023-08-26 DIAGNOSIS — G8929 Other chronic pain: Secondary | ICD-10-CM | POA: Diagnosis not present

## 2023-08-26 DIAGNOSIS — Z23 Encounter for immunization: Secondary | ICD-10-CM | POA: Diagnosis not present

## 2023-08-26 DIAGNOSIS — N951 Menopausal and female climacteric states: Secondary | ICD-10-CM | POA: Diagnosis not present

## 2023-08-26 MED ORDER — VENLAFAXINE HCL ER 37.5 MG PO CP24
37.5000 mg | ORAL_CAPSULE | Freq: Every day | ORAL | 2 refills | Status: DC
  Filled 2023-08-26: qty 30, 30d supply, fill #0
  Filled 2023-09-23: qty 30, 30d supply, fill #1
  Filled 2023-10-20: qty 30, 30d supply, fill #2

## 2023-08-26 NOTE — Patient Instructions (Signed)
Thank you, Ms.Pamela ROSELLEN LICHTENBERGER for allowing [place] to provide your care today.   Hot flashes I am sending in a medication called venlafaxine that should help with your symptoms.  I am starting you on the lowest dose.  Give this a couple weeks and if you are not noting improvement please come back to clinic.  Otherwise you can come back in 3 to 6 months depending on what your A1c result is.  Diabetes I will call you with the results from your A1c and cholesterol panel.  Blood pressure Would be helpful if you could check your blood pressure at home with your history of white coat syndrome.  I have ordered the following labs for you:   Lab Orders         Lipid Profile         Hemoglobin A1c      I have ordered the following medication/changed the following medications:   Stop the following medications: There are no discontinued medications.   Start the following medications: Meds ordered this encounter  Medications   venlafaxine XR (EFFEXOR XR) 37.5 MG 24 hr capsule    Sig: Take 1 capsule (37.5 mg total) by mouth daily.    Dispense:  30 capsule    Refill:  2     Follow up:  1 month for hot flashes  if no improvement  We look forward to seeing you next time. Please call our clinic at [phone] if you have any questions or concerns. The best time to call is Monday-Friday from 9am-4pm, but there is someone available 24/7. If after hours or the weekend, call the main hospital number and ask for the Internal Medicine Resident On-Call. If you need medication refills, please notify your pharmacy one week in advance and they will send [place] a request.   Thank you for trusting me with your care. Wishing you the best!   Rudene Christians, DO [facility]

## 2023-08-26 NOTE — Progress Notes (Unsigned)
Subjective:  CC: diabetes follow-up  HPI:  [name] is a 53 y.o. female with a past medical history of diabetes, HLD, obesity, polyneuropathy, tobacco use disorder who presents today for diabetes.   Please see problem based assessment and plan for additional details.  Past Medical History:  Diagnosis Date   Arthritis    Chronic headaches    Colon polyps    Diabetes mellitus without complication (HCC)    HLD (hyperlipidemia)    Hot flashes 02/29/2020   Lumbar facet arthropathy    Neuropathy    Prediabetes    Sciatica of right side    Sciatica of right side 05/04/2014    MEDICATIONS:  Semaglutide 0.5 mg Metformin 1000 mg twice daily Rosuvastatin 20 mg Gabapentin 300 mg nightly Robaxin-750 milligrams 3 times daily PRN  Family History  Problem Relation Age of Onset   Diabetes Mother    Diabetes Mellitus II Mother    Thyroid disease Mother    Colon polyps Mother    Diabetes Father    Diabetes Mellitus II Father    Diabetes Maternal Aunt        several aunts and uncles   Heart disease Maternal Aunt    Pancreatic cancer Maternal Aunt    Diabetes Maternal Uncle        several aunts and uncles   Pancreatic cancer Maternal Uncle    Diabetes Paternal Aunt        several aunts and uncles   Diabetes Paternal Uncle        several aunts and uncles   Diabetes Maternal Grandmother    Pancreatic cancer Maternal Grandfather    Diabetes Maternal Grandfather    Pancreatic disease Maternal Grandfather        cancer   Diabetes Paternal Grandmother    Diabetes Paternal Grandfather    Colon cancer Neg Hx    Rectal cancer Neg Hx    Stomach cancer Neg Hx    Esophageal cancer Neg Hx    Inflammatory bowel disease Neg Hx    Liver disease Neg Hx     Social History   Socioeconomic History   Marital status: Single    Spouse name: Not on file   Number of children: 3   Years of education: Not on file   Highest education level: Some college, no degree   Occupational History   Occupation: Unemployed  Tobacco Use   Smoking status: Former    Current packs/day: 0.00    Average packs/day: 0.3 packs/day for 20.0 years (5.0 ttl pk-yrs)    Types: Cigarettes    Start date: 07/01/2000    Quit date: 07/01/2020    Years since quitting: 3.1   Smokeless tobacco: Never   Tobacco comments:    Stopped last year   Vaping Use   Vaping status: Never Used  Substance and Sexual Activity   Alcohol use: No   Drug use: No   Sexual activity: Not Currently    Birth control/protection: Surgical  Other Topics Concern   Not on file  Social History Narrative   Lives with someone, (partner)   Social Drivers of Health   Financial Resource Strain: Low Risk  (04/01/2023)   Overall Financial Resource Strain (CARDIA)    Difficulty of Paying Living Expenses: Not hard at all  Food Insecurity: No Food Insecurity (04/01/2023)   Hunger Vital Sign    Worried About Running Out of Food in the Last Year: Never true    Ran Out  of Food in the Last Year: Never true  Transportation Needs: No Transportation Needs (04/01/2023)   PRAPARE - Administrator, Civil Service (Medical): No    Lack of Transportation (Non-Medical): No  Physical Activity: Insufficiently Active (04/01/2023)   Exercise Vital Sign    Days of Exercise per Week: 3 days    Minutes of Exercise per Session: 30 min  Stress: No Stress Concern Present (04/01/2023)   [employer] of Occupational Health - Occupational Stress Questionnaire    Feeling of Stress : Not at all  Social Connections: Moderately Isolated (04/01/2023)   Social Connection and Isolation Panel [NHANES]    Frequency of Communication with Friends and Family: More than three times a week    Frequency of Social Gatherings with Friends and Family: Twice a week    Attends Religious Services: Never    Database administrator or Organizations: No    Attends Banker Meetings: Never    Marital Status: Living with partner   Intimate Partner Violence: Not At Risk (04/01/2023)   Humiliation, Afraid, Rape, and Kick questionnaire    Fear of Current or Ex-Partner: No    Emotionally Abused: No    Physically Abused: No    Sexually Abused: No    Review of Systems: ROS negative except for what is noted on the assessment and plan.  Objective:   Vitals:   08/26/23 1003 08/26/23 1030  BP: (!) 142/87 123/84  Pulse: 66 60  Temp: 98.4 F (36.9 C)   TempSrc: Oral   SpO2: 100%   Weight: 216 lb 6.4 oz (98.2 kg)   Height: 5\' 9"  (1.753 m)     Physical Exam: Constitutional: well-appearing, in no acute distress Cardiovascular: regular rate and rhythm, no m/r/g Pulmonary/Chest: normal work of breathing on room air, lungs clear to auscultation bilaterally Abdominal: soft, non-tender, non-distended MSK: normal bulk and tone Neurological: alert & oriented x 3,normal gait   Assessment & Plan:  Controlled type 2 diabetes mellitus (HCC) Diabetes well-controlled from 6.2-5.9.  She is taking Ozempic 0.5 mg.  She is not having side effects currently and does not want to go up on dose of Ozempic.  She is lost about 50 pounds since 2023 intentionally. P: Repeat A1c improved.  Now can move A1c recheck out to every 6 months Continue Ozempic 0.5 mg daily Repeat lipid panel for primary prevention, is adherent with rosuvastatin 20 mg Foot exam Ophthalmology referral  Chronic dental pain Medical clearance form completed for tooth extraction under general anesthesia.  I talked with her about holding Ozempic 1 week prior to surgery to decrease her risk of aspiration.  Healthcare maintenance Received her pneumococcal vaccine  Patient discussed with Dr. Marjorie Smolder Tresten Pantoja, D.O. [facility]  PGY-[id number] Pager: [phone]  Phone: [phone] Date 08/27/2023  Time 8:46 AM

## 2023-08-27 ENCOUNTER — Other Ambulatory Visit: Payer: Self-pay | Admitting: Student

## 2023-08-27 ENCOUNTER — Other Ambulatory Visit: Payer: Self-pay

## 2023-08-27 DIAGNOSIS — G6289 Other specified polyneuropathies: Secondary | ICD-10-CM

## 2023-08-27 DIAGNOSIS — G8929 Other chronic pain: Secondary | ICD-10-CM | POA: Insufficient documentation

## 2023-08-27 LAB — HEMOGLOBIN A1C
Est. average glucose Bld gHb Est-mCnc: 123 mg/dL
Hgb A1c MFr Bld: 5.9 % — ABNORMAL HIGH (ref 4.8–5.6)

## 2023-08-27 LAB — LIPID PANEL
Chol/HDL Ratio: 2.5 {ratio} (ref 0.0–4.4)
Cholesterol, Total: 94 mg/dL — ABNORMAL LOW (ref 100–199)
HDL: 38 mg/dL — ABNORMAL LOW (ref >39)
LDL Chol Calc (NIH): 38 mg/dL (ref 0–99)
Triglycerides: 93 mg/dL (ref 0–149)
VLDL Cholesterol Cal: 18 mg/dL (ref 5–40)

## 2023-08-27 NOTE — Assessment & Plan Note (Signed)
Received her pneumococcal vaccine

## 2023-08-27 NOTE — Assessment & Plan Note (Signed)
Diabetes well-controlled from 6.2-5.9.  She is taking Ozempic 0.5 mg.  She is not having side effects currently and does not want to go up on dose of Ozempic.  She is lost about 50 pounds since 2023 intentionally. P: Repeat A1c improved.  Now can move A1c recheck out to every 6 months Continue Ozempic 0.5 mg daily Repeat lipid panel for primary prevention, is adherent with rosuvastatin 20 mg Foot exam Ophthalmology referral

## 2023-08-27 NOTE — Assessment & Plan Note (Signed)
Medical clearance form completed for tooth extraction under general anesthesia.  I talked with her about holding Ozempic 1 week prior to surgery to decrease her risk of aspiration.

## 2023-08-28 ENCOUNTER — Other Ambulatory Visit: Payer: Self-pay

## 2023-08-28 ENCOUNTER — Encounter: Payer: Self-pay | Admitting: Internal Medicine

## 2023-08-28 ENCOUNTER — Other Ambulatory Visit (HOSPITAL_COMMUNITY): Payer: Self-pay

## 2023-08-28 MED ORDER — METFORMIN HCL 1000 MG PO TABS
1000.0000 mg | ORAL_TABLET | Freq: Two times a day (BID) | ORAL | 5 refills | Status: DC
  Filled 2023-08-28: qty 120, 60d supply, fill #0
  Filled 2023-11-05: qty 120, 60d supply, fill #1
  Filled 2023-12-25: qty 120, 60d supply, fill #2
  Filled 2024-03-04: qty 120, 60d supply, fill #3
  Filled 2024-05-09: qty 120, 60d supply, fill #4
  Filled 2024-07-04: qty 120, 60d supply, fill #5

## 2023-08-28 MED ORDER — GABAPENTIN 100 MG PO CAPS
300.0000 mg | ORAL_CAPSULE | Freq: Every day | ORAL | 3 refills | Status: DC
  Filled 2023-08-28: qty 90, 30d supply, fill #0
  Filled 2023-09-23: qty 90, 30d supply, fill #1
  Filled 2023-11-05: qty 90, 30d supply, fill #2
  Filled 2023-12-25: qty 90, 30d supply, fill #3

## 2023-08-29 ENCOUNTER — Other Ambulatory Visit (HOSPITAL_COMMUNITY): Payer: Self-pay

## 2023-08-30 ENCOUNTER — Other Ambulatory Visit: Payer: Self-pay

## 2023-08-31 ENCOUNTER — Ambulatory Visit: Payer: Medicare (Managed Care) | Attending: Internal Medicine | Admitting: Physical Therapy

## 2023-08-31 ENCOUNTER — Encounter: Payer: Self-pay | Admitting: Physical Therapy

## 2023-08-31 DIAGNOSIS — M6281 Muscle weakness (generalized): Secondary | ICD-10-CM | POA: Diagnosis present

## 2023-08-31 DIAGNOSIS — M5459 Other low back pain: Secondary | ICD-10-CM | POA: Insufficient documentation

## 2023-08-31 DIAGNOSIS — R2689 Other abnormalities of gait and mobility: Secondary | ICD-10-CM | POA: Diagnosis present

## 2023-08-31 NOTE — Progress Notes (Signed)
 Internal Medicine Clinic Attending  Case discussed with the resident at the time of the visit.  We reviewed the resident's history and exam and pertinent patient test results.  I agree with the assessment, diagnosis, and plan of care documented in the resident's note.

## 2023-08-31 NOTE — Therapy (Signed)
OUTPATIENT PHYSICAL THERAPY NEURO TREATMENT NOTE/DISCHARGE SUMMARY   Patient Name: Maureen Reyes MRN: [id number] DOB:05/13/1971, 53 y.o., female Today's Date: 08/31/2023   PCP: Olegario Messier, MD  REFERRING PROVIDER: Earl Lagos, MD     PHYSICAL THERAPY DISCHARGE SUMMARY  Visits from Start of Care: 14  Current functional level related to goals / functional outcomes: Pt has met all LTGs-see below   Remaining deficits: Muscle weakness LLE (improved) and pain (improved)   Education / Equipment: HEP, education in posture, body mechanics   Patient agrees to discharge. Patient goals were met. Patient is being discharged due to meeting the stated rehab goals.    END OF SESSION:  PT End of Session - 08/31/23 1407     Visit Number 14    Number of Visits 15    Date for PT Re-Evaluation 09/02/23    Authorization Type UHC Medicare-Medicaid Dual Complete    Authorization - Visit Number 14    Authorization - Number of Visits 27    PT Start Time 1407   arrived late   PT Stop Time 1445    PT Time Calculation (min) 38 min    Activity Tolerance Patient tolerated treatment well    Behavior During Therapy WFL for tasks assessed/performed                       Past Medical History:  Diagnosis Date   Arthritis    Chronic headaches    Colon polyps    Diabetes mellitus without complication (HCC)    HLD (hyperlipidemia)    Hot flashes 02/29/2020   Lumbar facet arthropathy    Neuropathy    Prediabetes    Sciatica of right side    Sciatica of right side 05/04/2014   Past Surgical History:  Procedure Laterality Date   COLONOSCOPY  2023   [place]   TUBAL LIGATION  1990 pt reported   Patient Active Problem List   Diagnosis Date Noted   Chronic dental pain 08/27/2023   Family history of pancreatic cancer 08/04/2023   Screening procedure 08/04/2023   Genetic testing 04/20/2023   Colon polyps    Mononeuropathy 10/14/2022   Positive FIT (fecal  immunochemical test) 07/24/2021   Controlled type 2 diabetes mellitus (HCC) 07/03/2021   Hypercholesteremia 07/03/2021   Chronic lower back pain 11/26/2020   Subclinical hypothyroidism 04/19/2019   Ganglion cyst 04/19/2019   Obesity (BMI 30-39.9) 12/09/2017   Atopic dermatitis 02/18/2017   White coat syndrome with high blood pressure but without hypertension 02/18/2017   Polyneuropathy 11/28/2014   Lumbar facet arthropathy 05/04/2014   Healthcare maintenance 05/04/2014   Tobacco use disorder, mild, in sustained remission 05/04/2014    ONSET DATE: "a while"  REFERRING DIAG: G62.89 (ICD-10-CM) - Other polyneuropathy  THERAPY DIAG:  Other low back pain  Muscle weakness (generalized)  Other abnormalities of gait and mobility  Rationale for Evaluation and Treatment: Rehabilitation  SUBJECTIVE:  SUBJECTIVE STATEMENT: Pain is good.  Been getting better. Pt accompanied by: self  PERTINENT HISTORY: HA's, DM, HLD, neuropathy  PAIN:  Are you having pain? Yes: NPRS scale: 1/10 Pain location: L LB Pain description: ache that comes and goes Aggravating factors: night time Relieving factors: massage, stretching her back, change positions  PRECAUTIONS: None  RED FLAGS: None   WEIGHT BEARING RESTRICTIONS: No  FALLS: Has patient fallen in last 6 months? 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, agricultural  PATIENT GOALS: improve pain   OBJECTIVE:    TODAY'S TREATMENT: 08/31/2023 Activity Comments  MMT See  below  Thorough review of HEP-verbal, visual and return demo  Good repetition  28M walk:  10.78 sec   :  350 ft  No lateral trunk lean            MMT (in sitting) Right Eval Left Eval Right 07/27/23 Left  07/27/23 Right 08/31/2023 Left 08/31/2023  Hip flexion 4+ 4 5 4+ 5 4+  Hip extension        Hip abduction 4 4 4+ 4 5 4+  Hip adduction 4 4 4 4 5 5   Hip internal rotation        Hip external rotation        Knee flexion 4+ 4- 5 4 5  4+  Knee extension 4+ 4+ 5 5 5 5   Ankle dorsiflexion 4+ 4+      Ankle plantarflexion 4+ 4+      Ankle inversion        Ankle eversion          Access Code: [email or web address] URL: [email or web address] Date: 08/17/2023 Prepared by: [facility] - Outpatient  Rehab - [facility]  Exercises - Seated Flexion Stretch with Swiss Ball  - 1 x daily - 5 x weekly - 2 sets - 10 reps - 5 sec hold - Supine Lower Trunk Rotation  - 1 x daily - 5 x weekly - 2 sets - 10 reps - Supine Piriformis Stretch with Foot on Ground  - 1 x daily - 7 x weekly - 1 sets - 3-5 reps - 15-30 sec hold - Supine Bridge with Resistance Band  - 1 x daily - 5 x weekly - 2 sets - 10 reps - Standing Hip Abduction with Resistance at Ankles and Counter Support  - 1 x daily - 7 x weekly - 3 sets - 10 reps - Side Stepping with Resistance at Ankles and Counter Support  - 1 x daily - 5 x weekly - 2 sets - 10 reps - Sit to Stand with Resistance Around Legs  - 1 x daily - 7 x weekly - 3 sets - 5 reps - Modified Deadlift with Pelvic Contraction  - 1 x daily - 7 x weekly - 3 sets - 10 reps - Standing Shoulder Row with Anchored Resistance  - 1 x daily - 5 x weekly - 2 sets - 10 reps - Seated Lat Pull Down with Resistance - Elbows Bent  - 1 x daily - 5 x weekly - 2 sets - 10 reps - Shoulder Horizontal Abduction Seated on Swiss Ball  - 1 x daily - 5 x weekly - 2 sets - 10 reps - Curator  -  1 x daily - 7 x weekly - 1 sets - 3 reps    PATIENT EDUCATION: Education details:Review of HEP, progress towards goals, POC and plan for d/c  today. Discussed rationale for and connection to exercises and daily activities with posture, body mechanics and prevention of further back injury Person educated: Patient Education method: Explanation, Demonstration, Tactile cues, Verbal cues, and Handouts Education comprehension: verbalized understanding and returned demonstration     ------------------------------------------------- Note: Objective measures below were completed at Evaluation unless otherwise noted.  DIAGNOSTIC FINDINGS: none recent  COGNITION: Overall cognitive status: Within functional limits for tasks assessed   SENSATION: Intact to light touch in B LEs   POSTURE:  anterior pelvic tilt in standing   PALPATION: very tight in B thoracolumbar paraspinals and glutes; no particular TTP  LUMBAR ROM:   Active  A/PROM  eval AROM 06/3023  Flexion ankle ankle  Extension 10% limited C/o "pop" WFL-"pressure"  Right lateral flexion Jt line WFL-pain at joint line  Left lateral flexion Jt line WFL-no issues  Right rotation 25% limited WFL  L rotation 25% limited WFL   (Blank rows = not tested)   LOWER EXTREMITY MMT:     MMT (in sitting) Right Eval Left Eval Right 07/27/23 Left  07/27/23  Hip flexion 4+ 4 5 4+  Hip extension      Hip abduction 4 4 4+ 4  Hip adduction 4 4 4 4   Hip internal rotation      Hip external rotation      Knee flexion 4+ 4- 5 4  Knee extension 4+ 4+ 5 5  Ankle dorsiflexion 4+ 4+    Ankle plantarflexion 4+ 4+    Ankle inversion      Ankle eversion       (Blank rows = not tested)   GAIT: Gait pattern: L trunk lean and L>R hip drop Assistive device utilized: None Level of assistance: Complete Independence  FUNCTIONAL TESTS:  18M walk: 11.26 sec (2.91 ft/sec)        GOALS: Goals reviewed with patient? Yes  SHORT TERM GOALS: Target date: 07/13/2023  Patient to be independent with initial HEP. Baseline: HEP initiated Goal status: MET    LONG TERM GOALS:  Target date: 09/02/2023  Patient to be independent with advanced HEP. Baseline: Not yet initiated ; reports good understanding 08/05/23 Goal status: MET 08/31/23  Patient to demonstrate B LE strength >/=4+/5.  Baseline: See above; grossly unchanged 08/05/23 Goal status: MET  08/31/2023  Patient to demonstrate lumbar AROM WFL and without pain limiting.  Baseline: see above Goal status: MET  Patient to report at least 5 hours of sleep uninterrupted by pain per average night.  Baseline: reports 3 hours of sleep per night; (07/27/23) unchanged; pt reports 5-6 hours 08/05/23 Goal status: MET 08/05/23  Patient to demonstrate 25% improvement in gait deviations.  Baseline:  Pt reports "lighter and taller" with walking, no leaning 08/31/2023 Goal status: MET  08/31/23    ASSESSMENT:  CLINICAL IMPRESSION: Pt has met 5 of 5 LTGs and she reports very much decreased pain, only 1/10 today.  She notes overall improved sleep and reports managing any pain with stretches or strengthening exercises as part of HEP.  Educated pt today in rationale for exercises in relation to daily activities such as carrying grocery bags or heavy items, as well as body mechanics to prevent further injury.  Pt seems very pleased with her functional level and is appropriate for discharge this visit.  OBJECTIVE  IMPAIRMENTS: Abnormal gait, decreased activity tolerance, decreased ROM, decreased strength, increased muscle spasms, impaired flexibility, improper body mechanics, postural dysfunction, and pain.   ACTIVITY LIMITATIONS: carrying, lifting, bending, standing, squatting, stairs, transfers, bed mobility, reach over head, and hygiene/grooming  PARTICIPATION LIMITATIONS: meal prep, cleaning, laundry, shopping, community activity, yard work, and church  PERSONAL FACTORS: Age, Past/current experiences, Time since onset of injury/illness/exacerbation, and 3+ comorbidities: HA's, DM, HLD, neuropathy  are also affecting patient's functional  outcome.   REHAB POTENTIAL: Good  CLINICAL DECISION MAKING: Evolving/moderate complexity  EVALUATION COMPLEXITY: Moderate  PLAN:  PT FREQUENCY: 1x/week  PT DURATION: 4 weeks  PLANNED INTERVENTIONS: 97164- PT Re-evaluation, 97110-Therapeutic exercises, 97530- Therapeutic activity, [id number]- Neuromuscular re-education, 97535- Self Care, 16109- Manual therapy, [id number]- Gait training, [phone]- Canalith repositioning, [id number]- Aquatic Therapy, [phone]- Electrical stimulation (manual), Patient/Family education, Balance training, Stair training, Taping, Dry Needling, Joint mobilization, Spinal mobilization, Vestibular training, Cryotherapy, and Moist heat  PLAN FOR NEXT SESSION:  Discharge this visit.    Lonia Blood, PT 08/31/23 5:21 PM Phone: [phone] Fax: [phone]  [facility] at [facility] [address] [place], [address] [place], [place] [address] Phone # [phone] Fax # [phone]

## 2023-09-02 ENCOUNTER — Other Ambulatory Visit (HOSPITAL_COMMUNITY): Payer: Self-pay

## 2023-09-03 ENCOUNTER — Other Ambulatory Visit (HOSPITAL_COMMUNITY): Payer: Self-pay

## 2023-09-03 ENCOUNTER — Encounter: Payer: Medicaid Other | Admitting: Student

## 2023-09-03 ENCOUNTER — Other Ambulatory Visit: Payer: Self-pay

## 2023-09-03 DIAGNOSIS — Z79899 Other long term (current) drug therapy: Secondary | ICD-10-CM | POA: Diagnosis not present

## 2023-09-03 MED ORDER — METHOCARBAMOL 750 MG PO TABS
750.0000 mg | ORAL_TABLET | Freq: Three times a day (TID) | ORAL | 0 refills | Status: DC
  Filled 2023-09-03: qty 42, 14d supply, fill #0

## 2023-09-03 MED ORDER — OXYCODONE-ACETAMINOPHEN 5-325 MG PO TABS
1.0000 | ORAL_TABLET | Freq: Every evening | ORAL | 0 refills | Status: DC
  Filled 2023-09-03: qty 14, 14d supply, fill #0

## 2023-09-03 MED ORDER — NAPROXEN 500 MG PO TABS
500.0000 mg | ORAL_TABLET | Freq: Two times a day (BID) | ORAL | 0 refills | Status: DC
  Filled 2023-09-03: qty 28, 14d supply, fill #0

## 2023-09-07 ENCOUNTER — Ambulatory Visit: Payer: Medicare (Managed Care) | Admitting: Physical Therapy

## 2023-09-08 DIAGNOSIS — Z79899 Other long term (current) drug therapy: Secondary | ICD-10-CM | POA: Diagnosis not present

## 2023-09-16 ENCOUNTER — Telehealth: Payer: Self-pay

## 2023-09-16 NOTE — Telephone Encounter (Signed)
 Prior Authorization for patient (Ozempic (0.25 or 0.5 MG/DOSE) 2MG /3ML pen-injectors) came through on cover my meds was submitted with last office notes and labs awaiting approval or denial.  ZOX:[id number]

## 2023-09-17 ENCOUNTER — Other Ambulatory Visit (HOSPITAL_COMMUNITY): Payer: Self-pay

## 2023-09-17 DIAGNOSIS — M549 Dorsalgia, unspecified: Secondary | ICD-10-CM | POA: Diagnosis not present

## 2023-09-17 DIAGNOSIS — G8929 Other chronic pain: Secondary | ICD-10-CM | POA: Diagnosis not present

## 2023-09-17 DIAGNOSIS — Z79899 Other long term (current) drug therapy: Secondary | ICD-10-CM | POA: Diagnosis not present

## 2023-09-17 DIAGNOSIS — Z6831 Body mass index (BMI) 31.0-31.9, adult: Secondary | ICD-10-CM | POA: Diagnosis not present

## 2023-09-17 DIAGNOSIS — I7090 Unspecified atherosclerosis: Secondary | ICD-10-CM | POA: Diagnosis not present

## 2023-09-17 DIAGNOSIS — E6609 Other obesity due to excess calories: Secondary | ICD-10-CM | POA: Diagnosis not present

## 2023-09-17 MED ORDER — METHOCARBAMOL 750 MG PO TABS
750.0000 mg | ORAL_TABLET | Freq: Three times a day (TID) | ORAL | 0 refills | Status: DC
  Filled 2023-09-17: qty 42, 14d supply, fill #0

## 2023-09-17 MED ORDER — OXYCODONE-ACETAMINOPHEN 5-325 MG PO TABS
1.0000 | ORAL_TABLET | Freq: Every day | ORAL | 0 refills | Status: AC
  Filled 2023-09-17: qty 14, 14d supply, fill #0

## 2023-09-17 MED ORDER — NAPROXEN 500 MG PO TABS
500.0000 mg | ORAL_TABLET | Freq: Two times a day (BID) | ORAL | 0 refills | Status: DC
  Filled 2023-09-17: qty 28, 14d supply, fill #0

## 2023-09-17 NOTE — Telephone Encounter (Signed)
 Lindsay Straka (Key: [id number]) Ozempic (0.25 or 0.5 MG/DOSE) 2MG Ronny Bacon pen-injectors Form [employer] Electronic Prior Authorization Form [phone] NCPDP) Created Message from Plan Request Reference Number: [id number]. OZEMPIC INJ 2MG /3ML is approved through 09/15/2024. For further questions, call [employer] at [phone].Marland Kitchen Authorization Expiration Date: September 15, 2024.

## 2023-09-23 ENCOUNTER — Other Ambulatory Visit: Payer: Self-pay | Admitting: Student

## 2023-09-23 ENCOUNTER — Other Ambulatory Visit: Payer: Self-pay

## 2023-09-23 ENCOUNTER — Other Ambulatory Visit (HOSPITAL_COMMUNITY): Payer: Self-pay

## 2023-09-23 DIAGNOSIS — E119 Type 2 diabetes mellitus without complications: Secondary | ICD-10-CM

## 2023-09-23 MED ORDER — OZEMPIC (0.25 OR 0.5 MG/DOSE) 2 MG/3ML ~~LOC~~ SOPN
0.2500 mg | PEN_INJECTOR | SUBCUTANEOUS | 2 refills | Status: DC
  Filled 2023-09-23: qty 3, 28d supply, fill #0
  Filled 2023-10-20: qty 3, 28d supply, fill #1
  Filled 2023-11-25: qty 3, 28d supply, fill #2

## 2023-11-05 ENCOUNTER — Other Ambulatory Visit: Payer: Self-pay

## 2023-11-25 ENCOUNTER — Other Ambulatory Visit (HOSPITAL_COMMUNITY): Payer: Self-pay

## 2023-11-25 ENCOUNTER — Other Ambulatory Visit: Payer: Self-pay | Admitting: Internal Medicine

## 2023-11-25 ENCOUNTER — Other Ambulatory Visit: Payer: Self-pay

## 2023-11-25 ENCOUNTER — Other Ambulatory Visit: Payer: Self-pay | Admitting: Student

## 2023-11-25 DIAGNOSIS — N951 Menopausal and female climacteric states: Secondary | ICD-10-CM

## 2023-11-25 DIAGNOSIS — E782 Mixed hyperlipidemia: Secondary | ICD-10-CM

## 2023-11-25 MED ORDER — ROSUVASTATIN CALCIUM 20 MG PO TABS
20.0000 mg | ORAL_TABLET | Freq: Every day | ORAL | 2 refills | Status: DC
  Filled 2023-11-25: qty 90, 90d supply, fill #0
  Filled 2024-03-04: qty 90, 90d supply, fill #1
  Filled 2024-06-01: qty 90, 90d supply, fill #2

## 2023-11-25 NOTE — Telephone Encounter (Signed)
 Medication sent to pharmacy

## 2023-11-27 ENCOUNTER — Other Ambulatory Visit (HOSPITAL_COMMUNITY): Payer: Self-pay

## 2023-11-27 MED ORDER — VENLAFAXINE HCL ER 37.5 MG PO CP24
37.5000 mg | ORAL_CAPSULE | Freq: Every day | ORAL | 2 refills | Status: DC
  Filled 2023-11-27: qty 30, 30d supply, fill #0
  Filled 2023-12-25: qty 30, 30d supply, fill #1
  Filled 2024-02-01: qty 30, 30d supply, fill #2

## 2023-12-25 ENCOUNTER — Other Ambulatory Visit: Payer: Self-pay | Admitting: Student

## 2023-12-25 ENCOUNTER — Other Ambulatory Visit: Payer: Self-pay

## 2023-12-25 DIAGNOSIS — E119 Type 2 diabetes mellitus without complications: Secondary | ICD-10-CM

## 2024-01-01 NOTE — Telephone Encounter (Signed)
 Pt stated she's on Ozempic  0.5 mg.

## 2024-01-04 ENCOUNTER — Other Ambulatory Visit (HOSPITAL_COMMUNITY): Payer: Self-pay

## 2024-01-13 ENCOUNTER — Other Ambulatory Visit: Payer: Self-pay | Admitting: Student

## 2024-01-13 DIAGNOSIS — E119 Type 2 diabetes mellitus without complications: Secondary | ICD-10-CM

## 2024-01-13 MED ORDER — OZEMPIC (0.25 OR 0.5 MG/DOSE) 2 MG/3ML ~~LOC~~ SOPN
0.2500 mg | PEN_INJECTOR | SUBCUTANEOUS | 2 refills | Status: DC
  Filled 2024-01-13: qty 3, 28d supply, fill #0
  Filled 2024-02-11: qty 3, 28d supply, fill #1
  Filled 2024-03-08 (×2): qty 3, 28d supply, fill #2

## 2024-01-14 ENCOUNTER — Other Ambulatory Visit (HOSPITAL_COMMUNITY): Payer: Self-pay

## 2024-02-01 ENCOUNTER — Other Ambulatory Visit: Payer: Self-pay

## 2024-02-01 ENCOUNTER — Other Ambulatory Visit: Payer: Self-pay | Admitting: Student

## 2024-02-01 DIAGNOSIS — G6289 Other specified polyneuropathies: Secondary | ICD-10-CM

## 2024-02-02 ENCOUNTER — Other Ambulatory Visit: Payer: Self-pay

## 2024-02-02 ENCOUNTER — Other Ambulatory Visit (HOSPITAL_COMMUNITY): Payer: Self-pay

## 2024-02-02 MED ORDER — GABAPENTIN 100 MG PO CAPS
300.0000 mg | ORAL_CAPSULE | Freq: Every day | ORAL | 3 refills | Status: DC
  Filled 2024-02-02 – 2024-03-04 (×2): qty 90, 30d supply, fill #0
  Filled 2024-04-08: qty 90, 30d supply, fill #1
  Filled 2024-05-09: qty 90, 30d supply, fill #2
  Filled 2024-06-16: qty 90, 30d supply, fill #3

## 2024-02-11 ENCOUNTER — Other Ambulatory Visit (HOSPITAL_COMMUNITY): Payer: Self-pay

## 2024-03-04 ENCOUNTER — Other Ambulatory Visit: Payer: Self-pay | Admitting: Student

## 2024-03-04 ENCOUNTER — Other Ambulatory Visit (HOSPITAL_COMMUNITY): Payer: Self-pay

## 2024-03-04 ENCOUNTER — Other Ambulatory Visit: Payer: Self-pay

## 2024-03-04 DIAGNOSIS — N951 Menopausal and female climacteric states: Secondary | ICD-10-CM

## 2024-03-07 ENCOUNTER — Other Ambulatory Visit (HOSPITAL_COMMUNITY): Payer: Self-pay

## 2024-03-07 MED ORDER — VENLAFAXINE HCL ER 37.5 MG PO CP24
37.5000 mg | ORAL_CAPSULE | Freq: Every day | ORAL | 2 refills | Status: DC
Start: 2024-03-07 — End: 2024-06-02
  Filled 2024-03-07: qty 30, 30d supply, fill #0
  Filled 2024-04-08: qty 30, 30d supply, fill #1
  Filled 2024-05-09: qty 30, 30d supply, fill #2

## 2024-03-08 ENCOUNTER — Other Ambulatory Visit (HOSPITAL_COMMUNITY): Payer: Self-pay

## 2024-03-08 ENCOUNTER — Ambulatory Visit: Payer: Medicare (Managed Care) | Admitting: Student

## 2024-03-08 ENCOUNTER — Other Ambulatory Visit: Payer: Self-pay

## 2024-03-08 VITALS — BP 127/85 | HR 53 | Temp 98.1°F | Ht 69.0 in | Wt 205.8 lb

## 2024-03-08 DIAGNOSIS — Z7985 Long-term (current) use of injectable non-insulin antidiabetic drugs: Secondary | ICD-10-CM | POA: Diagnosis not present

## 2024-03-08 DIAGNOSIS — N951 Menopausal and female climacteric states: Secondary | ICD-10-CM | POA: Diagnosis not present

## 2024-03-08 DIAGNOSIS — E119 Type 2 diabetes mellitus without complications: Secondary | ICD-10-CM | POA: Diagnosis not present

## 2024-03-08 DIAGNOSIS — Z7984 Long term (current) use of oral hypoglycemic drugs: Secondary | ICD-10-CM

## 2024-03-08 DIAGNOSIS — E7800 Pure hypercholesterolemia, unspecified: Secondary | ICD-10-CM | POA: Diagnosis not present

## 2024-03-08 LAB — POCT GLYCOSYLATED HEMOGLOBIN (HGB A1C): Hemoglobin A1C: 5.9 % — AB (ref 4.0–5.6)

## 2024-03-08 LAB — GLUCOSE, CAPILLARY: Glucose-Capillary: 102 mg/dL — ABNORMAL HIGH (ref 70–99)

## 2024-03-08 NOTE — Patient Instructions (Signed)
 Return in about 1 week (around 03/15/2024) for hormone replacement therapy.  Remember to bring all of the medications that you take (including over the counter medications and supplements) with you to every clinic visit.  This after visit summary is an important review of tests, referrals, and medication changes that were discussed during your visit. If you have questions or concerns, call [phone]. Outside of clinic business hours, call the main hospital at [phone] and ask the operator for the on-call internal medicine resident.   Ozell Kung MD 03/08/2024, 11:53 AM

## 2024-03-08 NOTE — Progress Notes (Signed)
 Patient name: Maureen Reyes Date of birth: 05-Jul-1971 Date of visit: 03/08/24  Subjective   Chief concern: medication for hot flashes isn't working  Hot flashes for years, even before peri-menopause. She's on venlafaxine  for this. She also takes gabapentin  for polyneuropathy. Despite treatment her hot flashes have worsened. She has several per day, worse at nighttime. Usually last a couple of minutes, longest may be 3 minutes.  No personal or family history of heart attack, stroke, or cancer.  No family history of breast, uterine, or ovarian cancer. Pancreatic cancer in maternal grandfather and 2 maternal aunts.  Mom had stroke in her 54s, dad had heart attack in his 47s.  Review of Systems  Constitutional:  Negative for fever, malaise/fatigue and weight loss (60 lbs with Ozempic ).  Respiratory:  Negative for cough and shortness of breath.   Cardiovascular:  Negative for chest pain, orthopnea and claudication.  Psychiatric/Behavioral:  Negative for depression. The patient is not nervous/anxious and does not have insomnia.    LMP January 2023 or 2024.  Current Outpatient Medications  Medication Instructions   Accu-Chek Softclix Lancets lancets Use as instructed   Blood Glucose Monitoring Suppl (ACCU-CHEK GUIDE) w/Device KIT Use as directed   Fiber Adult Gummies 2 g CHEW 2 each, Daily   gabapentin  (NEURONTIN ) 300 mg, Oral, Daily at bedtime   glucose blood (GNP TRUE METRIX GLUCOSE STRIPS) test strip Use as instructed to check blood sugar.   glucose blood test strip Use as instructed to check blood sugar.   Melatonin 10 MG CHEW 1 each, Daily at bedtime   metFORMIN  (GLUCOPHAGE ) 1,000 mg, Oral, 2 times daily with meals   methocarbamol  (ROBAXIN ) 750 mg, Oral, 3 times daily   naproxen  (NAPROSYN ) 500 mg, Oral, 2 times daily   oxyCODONE -acetaminophen  (PERCOCET/ROXICET) 5-325 MG tablet 1 tablet, Oral, Daily at bedtime   rosuvastatin  (CRESTOR ) 20 mg, Oral, Daily   Semaglutide ,0.25 or  0.5MG /DOS, (OZEMPIC , 0.25 OR 0.5 MG/DOSE,) 2 MG/3ML SOPN Inject 0.25 mg into the skin once a week. Increase after 4 weeks if tolerating well to 0.5mg .   venlafaxine  XR (EFFEXOR  XR) 37.5 mg, Oral, Daily     Objective  Today's Vitals   03/08/24 1107  BP: 127/85  Pulse: (!) 53  Temp: 98.1 F (36.7 C)  TempSrc: Oral  SpO2: 98%  Weight: 205 lb 12.8 oz (93.4 kg)  Height: 5' 9 (1.753 m)  Body mass index is 30.39 kg/m.   Physical Exam   Assessment & Plan   Symptomatic menopausal or female climacteric states Assessment & Plan: Chronic, worsening despite therapy.  On venlafaxine  for this.  Also takes gabapentin  for diabetic polyneuropathy.  Several hot flashes per day that last on the order of minutes, worse at night that wake her from sleep.  We talked about hormone replacement therapy since her symptoms have been refractory to SNRI.  I counseled her about the slightly increased risk of breast cancer and coronary heart disease with combined estrogen/progesterone.  I think her risk profile is overall low and a trial of hormone replacement therapy is reasonable for her.  She went home with some reading materials and will touch base over the phone in the next week or so to talk about initiating hormone replacement therapy.   Controlled type 2 diabetes mellitus without complication, without long-term current use of insulin  [facility]) Assessment & Plan: Lab Results  Component Value Date   HGBA1C 5.9 (A) 03/08/2024   HGBA1C 5.9 (H) 08/26/2023   HGBA1C 6.2 (A) 05/18/2023  Chronic and stable.  Well-controlled on once daily metformin  1000 mg and semaglutide  0.25 mg weekly.  Appropriate to continue these today.  Orders: -     POCT glycosylated hemoglobin (Hb A1C) -     Glucose, capillary  Hypercholesteremia Assessment & Plan: Lipid Panel     Component Value Date/Time   CHOL 94 (L) 08/26/2023 1039   TRIG 93 08/26/2023 1039   HDL 38 (L) 08/26/2023 1039   CHOLHDL 2.5 08/26/2023 1039    LDLCALC 38 08/26/2023 1039   LABVLDL 18 08/26/2023 1039  Cholesterol at goal on rosuvastatin  20 mg daily.     Return in about 1 week (around 03/15/2024) for hormone replacement therapy.  Ozell Kung MD 03/08/2024, 5:29 PM

## 2024-03-08 NOTE — Assessment & Plan Note (Signed)
 Lipid Panel     Component Value Date/Time   CHOL 94 (L) 08/26/2023 1039   TRIG 93 08/26/2023 1039   HDL 38 (L) 08/26/2023 1039   CHOLHDL 2.5 08/26/2023 1039   LDLCALC 38 08/26/2023 1039   LABVLDL 18 08/26/2023 1039  Cholesterol at goal on rosuvastatin  20 mg daily.

## 2024-03-08 NOTE — Assessment & Plan Note (Signed)
 Lab Results  Component Value Date   HGBA1C 5.9 (A) 03/08/2024   HGBA1C 5.9 (H) 08/26/2023   HGBA1C 6.2 (A) 05/18/2023   Chronic and stable.  Well-controlled on once daily metformin  1000 mg and semaglutide  0.25 mg weekly.  Appropriate to continue these today.

## 2024-03-08 NOTE — Assessment & Plan Note (Deleted)
 Lab Results  Component Value Date   HGBA1C 5.9 (A) 03/08/2024

## 2024-03-08 NOTE — Assessment & Plan Note (Signed)
 Chronic, worsening despite therapy.  On venlafaxine  for this.  Also takes gabapentin  for diabetic polyneuropathy.  Several hot flashes per day that last on the order of minutes, worse at night that wake her from sleep.  We talked about hormone replacement therapy since her symptoms have been refractory to SNRI.  I counseled her about the slightly increased risk of breast cancer and coronary heart disease with combined estrogen/progesterone.  I think her risk profile is overall low and a trial of hormone replacement therapy is reasonable for her.  She went home with some reading materials and will touch base over the phone in the next week or so to talk about initiating hormone replacement therapy.

## 2024-03-10 NOTE — Progress Notes (Signed)
 Internal Medicine Clinic Attending  Case discussed with the resident at the time of the visit.  We reviewed the resident's history and exam and pertinent patient test results.  I agree with the assessment, diagnosis, and plan of care documented in the resident's note.

## 2024-03-15 ENCOUNTER — Encounter (HOSPITAL_COMMUNITY): Payer: Self-pay

## 2024-03-15 ENCOUNTER — Other Ambulatory Visit (HOSPITAL_COMMUNITY): Payer: Self-pay

## 2024-03-15 ENCOUNTER — Telehealth: Payer: Self-pay | Admitting: Student

## 2024-03-15 DIAGNOSIS — N951 Menopausal and female climacteric states: Secondary | ICD-10-CM

## 2024-03-15 MED ORDER — FYAVOLV 0.5-2.5 MG-MCG PO TABS
1.0000 | ORAL_TABLET | Freq: Every day | ORAL | 3 refills | Status: AC
Start: 2024-03-15 — End: ?
  Filled 2024-03-15: qty 28, 28d supply, fill #0
  Filled 2024-04-08: qty 28, 28d supply, fill #1
  Filled 2024-05-09: qty 28, 28d supply, fill #2
  Filled 2024-06-01: qty 28, 28d supply, fill #3

## 2024-03-15 NOTE — Telephone Encounter (Signed)
 Per last visit, starting hormone replacement therapy for post-menopausal vasomotor symptoms.  Meds ordered this encounter  Medications   norethindrone -ethinyl estradiol  (FYAVOLV ) 0.5-2.5 MG-MCG tablet    Sig: Take 1 tablet by mouth daily.    Dispense:  30 tablet    Refill:  3   Follow-up in 3 months (end of November/early December 2025).  Ozell Kung MD 03/15/2024, 3:28 PM

## 2024-03-16 ENCOUNTER — Other Ambulatory Visit (HOSPITAL_COMMUNITY): Payer: Self-pay

## 2024-03-24 ENCOUNTER — Other Ambulatory Visit: Payer: Self-pay

## 2024-04-05 ENCOUNTER — Other Ambulatory Visit (HOSPITAL_COMMUNITY): Payer: Self-pay

## 2024-04-05 ENCOUNTER — Ambulatory Visit

## 2024-04-05 ENCOUNTER — Ambulatory Visit: Payer: Self-pay | Admitting: Urgent Care

## 2024-04-05 ENCOUNTER — Ambulatory Visit
Admission: EM | Admit: 2024-04-05 | Discharge: 2024-04-05 | Disposition: A | Discharge status: Home / Self Care | Attending: Family Medicine | Admitting: Family Medicine

## 2024-04-05 DIAGNOSIS — M79644 Pain in right finger(s): Secondary | ICD-10-CM

## 2024-04-05 DIAGNOSIS — S6991XA Unspecified injury of right wrist, hand and finger(s), initial encounter: Secondary | ICD-10-CM

## 2024-04-05 MED ORDER — IBUPROFEN 600 MG PO TABS
600.0000 mg | ORAL_TABLET | Freq: Four times a day (QID) | ORAL | 0 refills | Status: AC | PRN
  Filled 2024-04-05: qty 30, 8d supply, fill #0

## 2024-04-05 NOTE — ED Triage Notes (Signed)
 Pt c/o pain swelling right middle and index fingers started yesterday-reports possible injury but unsure-no pain meds PTA-NAD-steady gait

## 2024-04-05 NOTE — ED Provider Notes (Signed)
 [facility]  Note:  This document was prepared using Conservation officer, historic buildings and may include unintentional dictation errors.  MRN: [id number] DOB: 01/07/1971  Subjective:   Maureen Reyes is a 53 y.o. female presenting for 1 day history of a right middle finger injury.  Patient reports that she bumped it very slightly and did not think anything of it at the time.  Over the next few hours, progressively developed more pain, swelling mostly of the right middle finger but also to a much lesser degree of the right index finger.  No open wounds, drainage of pus or bleeding.  Has a history of gout but usually affects her in her toe.  No current facility-administered medications for this encounter.  Current Outpatient Medications:    rosuvastatin  (CRESTOR ) 20 MG tablet, Take 1 tablet (20 mg total) by mouth daily., Disp: 90 tablet, Rfl: 2   Accu-Chek Softclix Lancets lancets, Use as instructed, Disp: 100 each, Rfl: 12   Blood Glucose Monitoring Suppl (ACCU-CHEK GUIDE) w/Device KIT, Use as directed, Disp: 1 kit, Rfl: 3   Fiber Adult Gummies 2 g CHEW, Chew 2 each by mouth daily., Disp: , Rfl:    gabapentin  (NEURONTIN ) 100 MG capsule, Take 3 capsules (300 mg total) by mouth at bedtime., Disp: 90 capsule, Rfl: 3   glucose blood (GNP TRUE METRIX GLUCOSE STRIPS) test strip, Use as instructed to check blood sugar., Disp: 100 each, Rfl: 12   glucose blood test strip, Use as instructed to check blood sugar., Disp: 100 each, Rfl: 12   Melatonin 10 MG CHEW, Chew 1 each by mouth at bedtime., Disp: , Rfl:    metFORMIN  (GLUCOPHAGE ) 1000 MG tablet, Take 1 tablet (1,000 mg total) by mouth 2 (two) times daily with a meal. (Patient taking differently: Take 1,000 mg by mouth daily with breakfast.), Disp: 120 tablet, Rfl: 5   norethindrone -ethinyl estradiol  (FYAVOLV ) 0.5-2.5 MG-MCG tablet, Take 1 tablet by mouth daily., Disp: 28 tablet, Rfl: 3   oxyCODONE -acetaminophen   (PERCOCET/ROXICET) 5-325 MG tablet, Take 1 tablet by mouth at bedtime., Disp: 14 tablet, Rfl: 0   Semaglutide ,0.25 or 0.5MG /DOS, (OZEMPIC , 0.25 OR 0.5 MG/DOSE,) 2 MG/3ML SOPN, Inject 0.25 mg into the skin once a week. Increase after 4 weeks if tolerating well to 0.5mg ., Disp: 3 mL, Rfl: 2   venlafaxine  XR (EFFEXOR  XR) 37.5 MG 24 hr capsule, Take 1 capsule (37.5 mg total) by mouth daily., Disp: 30 capsule, Rfl: 2   Allergies  Allergen Reactions   Hydrocodone Nausea Only    Past Medical History:  Diagnosis Date   Arthritis    Chronic headaches    Colon polyps    Diabetes mellitus without complication (HCC)    HLD (hyperlipidemia)    Hot flashes 02/29/2020   Lumbar facet arthropathy    Neuropathy    Prediabetes    Sciatica of right side    Sciatica of right side 05/04/2014     Past Surgical History:  Procedure Laterality Date   COLONOSCOPY  2023   [facility]   TUBAL LIGATION  1990 pt reported    Family History  Problem Relation Age of Onset   Diabetes Mother    Diabetes Mellitus II Mother    Thyroid  disease Mother    Colon polyps Mother    Diabetes Father    Diabetes Mellitus II Father    Diabetes Maternal Aunt        several aunts and uncles   Heart disease Maternal Aunt  Pancreatic cancer Maternal Aunt    Diabetes Maternal Uncle        several aunts and uncles   Pancreatic cancer Maternal Uncle    Diabetes Paternal Aunt        several aunts and uncles   Diabetes Paternal Uncle        several aunts and uncles   Diabetes Maternal Grandmother    Pancreatic cancer Maternal Grandfather    Diabetes Maternal Grandfather    Pancreatic disease Maternal Grandfather        cancer   Diabetes Paternal Grandmother    Diabetes Paternal Grandfather    Colon cancer Neg Hx    Rectal cancer Neg Hx    Stomach cancer Neg Hx    Esophageal cancer Neg Hx    Inflammatory bowel disease Neg Hx    Liver disease Neg Hx     Social History   Tobacco Use   Smoking status: Former     Current packs/day: 0.00    Average packs/day: 0.3 packs/day for 20.0 years (5.0 ttl pk-yrs)    Types: Cigarettes    Start date: 07/01/2000    Quit date: 07/01/2020    Years since quitting: 3.7   Smokeless tobacco: Never   Tobacco comments:    Stopped last year   Vaping Use   Vaping status: Never Used  Substance Use Topics   Alcohol use: No   Drug use: No    ROS   Objective:   Vitals: BP 117/83 (BP Location: Right Arm)   Pulse 69   Temp 98.3 F (36.8 C) (Oral)   Resp 20   SpO2 96%   Physical Exam Constitutional:      General: She is not in acute distress.    Appearance: Normal appearance. She is well-developed. She is not ill-appearing, toxic-appearing or diaphoretic.  HENT:     Head: Normocephalic and atraumatic.     Nose: Nose normal.     Mouth/Throat:     Mouth: Mucous membranes are moist.  Eyes:     General: No scleral icterus.       Right eye: No discharge.        Left eye: No discharge.     Extraocular Movements: Extraocular movements intact.  Cardiovascular:     Rate and Rhythm: Normal rate.  Pulmonary:     Effort: Pulmonary effort is normal.  Musculoskeletal:       Hands:  Skin:    General: Skin is warm and dry.  Neurological:     General: No focal deficit present.     Mental Status: She is alert and oriented to person, place, and time.  Psychiatric:        Mood and Affect: Mood normal.        Behavior: Behavior normal.    Secured the right 2nd and 3rd fingers using buddy tape system.  Assessment and Plan :   PDMP not reviewed this encounter.  1. Finger pain, right   2. Injury of middle finger, right, initial encounter    X-ray over-read was pending at time of discharge, recommended follow up with only abnormal results. Otherwise will not call for negative over-read. Patient was in agreement.  Managing conservatively with buddy tape system, ibuprofen .  It is possible she is developing a gout flare but will hold off on strong  anti-inflammatories to address this.  Counseled patient on potential for adverse effects with medications prescribed/recommended today, ER and return-to-clinic precautions discussed, patient verbalized understanding.  Christopher Savannah, PA-C 04/05/24 1425

## 2024-04-05 NOTE — Discharge Instructions (Addendum)
 Will call with x-ray results later today. For now use buddy tape system to manage for finger contusion/bruise. Tylenol  or ibuprofen  for pain and inflammation.

## 2024-04-06 ENCOUNTER — Telehealth: Payer: Self-pay

## 2024-04-06 ENCOUNTER — Ambulatory Visit (INDEPENDENT_AMBULATORY_CARE_PROVIDER_SITE_OTHER): Payer: Medicare (Managed Care)

## 2024-04-06 VITALS — Ht 69.0 in | Wt 199.0 lb

## 2024-04-06 DIAGNOSIS — Z0000 Encounter for general adult medical examination without abnormal findings: Secondary | ICD-10-CM | POA: Diagnosis not present

## 2024-04-06 NOTE — Progress Notes (Signed)
 Because this visit was a virtual/telehealth visit,  certain criteria was not obtained, such a blood pressure, CBG if applicable, and timed get up and go. Any medications not marked as taking were not mentioned during the medication reconciliation part of the visit. Any vitals not documented were not able to be obtained due to this being a telehealth visit or patient was unable to self-report a recent blood pressure reading due to a lack of equipment at home via telehealth. Vitals that have been documented are verbally provided by the patient.   Subjective:   Maureen Reyes is a 53 y.o. who presents for a Medicare Wellness preventive visit.  As a reminder, Annual Wellness Visits don't include a physical exam, and some assessments may be limited, especially if this visit is performed virtually. We may recommend an in-person follow-up visit with your provider if needed.  Visit Complete: Virtual I connected with  Maureen Reyes on 04/06/24 by a audio enabled telemedicine application and verified that I am speaking with the correct person using two identifiers.  Patient Location: Home  Provider Location: Office/Clinic  I discussed the limitations of evaluation and management by telemedicine. The patient expressed understanding and agreed to proceed.  Vital Signs: Because this visit was a virtual/telehealth visit, some criteria may be missing or patient reported. Any vitals not documented were not able to be obtained and vitals that have been documented are patient reported.  VideoDeclined- This patient declined Librarian, academic. Therefore the visit was completed with audio only.  Persons Participating in Visit: Patient.  AWV Questionnaire: No: Patient Medicare AWV questionnaire was not completed prior to this visit.  Cardiac Risk Factors include: advanced age (>72men, >31 women);diabetes mellitus;dyslipidemia     Objective:    Today's Vitals    04/06/24 1112  Weight: 199 lb (90.3 kg)  Height: 5' 9 (1.753 m)  PainSc: 7   PainLoc: Finger   Body mass index is 29.39 kg/m.     04/06/2024   11:17 AM 06/22/2023    2:53 PM 05/18/2023    1:11 PM 04/01/2023   11:22 AM 01/15/2023   10:01 AM 10/14/2022    2:05 PM 07/15/2022    2:10 PM  Advanced Directives  Does Patient Have a Medical Advance Directive? No No Yes Yes No No No  Type of Best boy of [place];Living will Healthcare Power of [place];Living will     Does patient want to make changes to medical advance directive?   No - Patient declined      Copy of Healthcare Power of Attorney in Chart?   No - copy requested No - copy requested     Would patient like information on creating a medical advance directive? No - Patient declined No - Patient declined   No - Patient declined No - Patient declined No - Patient declined    Current Medications (verified) Outpatient Encounter Medications as of 04/06/2024  Medication Sig   rosuvastatin  (CRESTOR ) 20 MG tablet Take 1 tablet (20 mg total) by mouth daily.   Accu-Chek Softclix Lancets lancets Use as instructed   Blood Glucose Monitoring Suppl (ACCU-CHEK GUIDE) w/Device KIT Use as directed   Fiber Adult Gummies 2 g CHEW Chew 2 each by mouth daily.   gabapentin  (NEURONTIN ) 100 MG capsule Take 3 capsules (300 mg total) by mouth at bedtime.   glucose blood (GNP TRUE METRIX GLUCOSE STRIPS) test strip Use as instructed to check blood sugar.   glucose  blood test strip Use as instructed to check blood sugar.   ibuprofen  (ADVIL ) 600 MG tablet Take 1 tablet (600 mg total) by mouth every 6 (six) hours as needed.   Melatonin 10 MG CHEW Chew 1 each by mouth at bedtime.   metFORMIN  (GLUCOPHAGE ) 1000 MG tablet Take 1 tablet (1,000 mg total) by mouth 2 (two) times daily with a meal. (Patient taking differently: Take 1,000 mg by mouth daily with breakfast.)   norethindrone -ethinyl estradiol  (FYAVOLV ) 0.5-2.5 MG-MCG tablet Take 1  tablet by mouth daily.   oxyCODONE -acetaminophen  (PERCOCET/ROXICET) 5-325 MG tablet Take 1 tablet by mouth at bedtime. (Patient not taking: Reported on 04/06/2024)   Semaglutide ,0.25 or 0.5MG /DOS, (OZEMPIC , 0.25 OR 0.5 MG/DOSE,) 2 MG/3ML SOPN Inject 0.25 mg into the skin once a week. Increase after 4 weeks if tolerating well to 0.5mg .   venlafaxine  XR (EFFEXOR  XR) 37.5 MG 24 hr capsule Take 1 capsule (37.5 mg total) by mouth daily.   No facility-administered encounter medications on file as of 04/06/2024.    Allergies (verified) Hydrocodone   History: Past Medical History:  Diagnosis Date   Arthritis    Chronic headaches    Colon polyps    Diabetes mellitus without complication (HCC)    HLD (hyperlipidemia)    Hot flashes 02/29/2020   Lumbar facet arthropathy    Neuropathy    Prediabetes    Sciatica of right side    Sciatica of right side 05/04/2014   Past Surgical History:  Procedure Laterality Date   COLONOSCOPY  2023   [facility]   TUBAL LIGATION  1990 pt reported   Family History  Problem Relation Age of Onset   Diabetes Mother    Diabetes Mellitus II Mother    Thyroid  disease Mother    Colon polyps Mother    Diabetes Father    Diabetes Mellitus II Father    Diabetes Maternal Aunt        several aunts and uncles   Heart disease Maternal Aunt    Pancreatic cancer Maternal Aunt    Diabetes Maternal Uncle        several aunts and uncles   Pancreatic cancer Maternal Uncle    Diabetes Paternal Aunt        several aunts and uncles   Diabetes Paternal Uncle        several aunts and uncles   Diabetes Maternal Grandmother    Pancreatic cancer Maternal Grandfather    Diabetes Maternal Grandfather    Pancreatic disease Maternal Grandfather        cancer   Diabetes Paternal Grandmother    Diabetes Paternal Grandfather    Colon cancer Neg Hx    Rectal cancer Neg Hx    Stomach cancer Neg Hx    Esophageal cancer Neg Hx    Inflammatory bowel disease Neg Hx    Liver  disease Neg Hx    Social History   Socioeconomic History   Marital status: Single    Spouse name: Not on file   Number of children: 3   Years of education: Not on file   Highest education level: Some college, no degree  Occupational History   Occupation: Unemployed  Tobacco Use   Smoking status: Former    Current packs/day: 0.00    Average packs/day: 0.3 packs/day for 20.0 years (5.0 ttl pk-yrs)    Types: Cigarettes    Start date: 07/01/2000    Quit date: 07/01/2020    Years since quitting: 3.7   Smokeless tobacco:  Never   Tobacco comments:    Stopped last year   Vaping Use   Vaping status: Never Used  Substance and Sexual Activity   Alcohol use: No   Drug use: No   Sexual activity: Not Currently    Birth control/protection: Surgical  Other Topics Concern   Not on file  Social History Narrative   Lives with someone, (partner)   Social Drivers of Health   Financial Resource Strain: Low Risk  (04/06/2024)   Overall Financial Resource Strain (CARDIA)    Difficulty of Paying Living Expenses: Not very hard  Food Insecurity: No Food Insecurity (04/06/2024)   Hunger Vital Sign    Worried About Running Out of Food in the Last Year: Never true    Ran Out of Food in the Last Year: Never true  Transportation Needs: No Transportation Needs (04/06/2024)   PRAPARE - Administrator, Civil Service (Medical): No    Lack of Transportation (Non-Medical): No  Physical Activity: Sufficiently Active (04/06/2024)   Exercise Vital Sign    Days of Exercise per Week: 5 days    Minutes of Exercise per Session: 30 min  Recent Concern: Physical Activity - Insufficiently Active (03/08/2024)   Exercise Vital Sign    Days of Exercise per Week: 3 days    Minutes of Exercise per Session: 30 min  Stress: No Stress Concern Present (04/06/2024)   [employer] of Occupational Health - Occupational Stress Questionnaire    Feeling of Stress: Not at all  Social Connections: Moderately  Integrated (04/06/2024)   Social Connection and Isolation Panel    Frequency of Communication with Friends and Family: More than three times a week    Frequency of Social Gatherings with Friends and Family: Once a week    Attends Religious Services: 1 to 4 times per year    Active Member of [employer] or Organizations: No    Attends Engineer, structural: Never    Marital Status: Living with partner    Tobacco Counseling Counseling given: Not Answered Tobacco comments: Stopped last year     Clinical Intake:  Pre-visit preparation completed: Yes  Pain : 0-10 Pain Score: 7  Pain Type: Acute pain     Nutritional Risks: None Diabetes: Yes CBG done?: No Did pt. bring in CBG monitor from home?: No  Lab Results  Component Value Date   HGBA1C 5.9 (A) 03/08/2024   HGBA1C 5.9 (H) 08/26/2023   HGBA1C 6.2 (A) 05/18/2023     How often do you need to have someone help you when you read instructions, pamphlets, or other written materials from your doctor or pharmacy?: 1 - Never What is the last grade level you completed in school?: HSG  Interpreter Needed?: No  Information entered by :: Roz Fuller, LPN.   Activities of Daily Living     04/06/2024   11:17 AM 05/18/2023    1:10 PM  In your present state of health, do you have any difficulty performing the following activities:  Hearing? 0 0  Vision? 0 0  Difficulty concentrating or making decisions? 0 0  Walking or climbing stairs? 0 0  Dressing or bathing? 0 0  Doing errands, shopping? 0 0  Preparing Food and eating ? N   Using the Toilet? N   In the past six months, have you accidently leaked urine? N   Do you have problems with loss of bowel control? N   Managing your Medications? N   Managing your  Finances? N   Housekeeping or managing your Housekeeping? N     Patient Care Team: Nooruddin, Saad, MD as PCP - General Mansouraty, Aloha Raddle., MD as Consulting Physician (Gastroenterology) Octavia Bruckner, MD as Consulting Physician (Ophthalmology)  I have updated your Care Teams any recent Medical Services you may have received from other providers in the past year.     Assessment:   This is a routine wellness examination for [place].  Hearing/Vision screen Hearing Screening - Comments:: Patient has adequate hearing. Vision Screening - Comments:: Wears rx glasses - up to date with routine eye exams with Bruckner Octavia, MD.    Goals Addressed             This Visit's Progress    04/06/24: To maintain my health.         Depression Screen     04/06/2024   11:18 AM 03/08/2024   11:30 AM 05/18/2023    2:46 PM 04/01/2023   11:26 AM 10/14/2022    3:37 PM 10/14/2022    2:09 PM 07/15/2022    2:18 PM  PHQ 2/9 Scores  PHQ - 2 Score 0 0 0 0 0 0 0  PHQ- 9 Score 1 2  4        Fall Risk     04/06/2024   11:14 AM 03/08/2024   11:30 AM 05/18/2023    1:10 PM 04/01/2023   11:23 AM 01/15/2023   10:06 AM  Fall Risk   Falls in the past year? 0 0 0 0 0  Number falls in past yr: 0 0 0 0 0  Injury with Fall? 0 0 0 0 0  Risk for fall due to : No Fall Risks No Fall Risks  No Fall Risks   Follow up Falls evaluation completed Falls evaluation completed;Falls prevention discussed Falls evaluation completed Falls prevention discussed;Falls evaluation completed Falls evaluation completed    MEDICARE RISK AT HOME:  Medicare Risk at Home Any stairs in or around the home?: No If so, are there any without handrails?: No Home free of loose throw rugs in walkways, pet beds, electrical cords, etc?: Yes Adequate lighting in your home to reduce risk of falls?: Yes Life alert?: No Use of a cane, walker or w/c?: No Grab bars in the bathroom?: Yes Shower chair or bench in shower?: Yes (DON'T USE IT) Elevated toilet seat or a handicapped toilet?: Yes  TIMED UP AND GO:  Was the test performed?  No  Cognitive Function: Declined/Normal: No cognitive concerns noted by patient or family.  Patient alert, oriented, able to answer questions appropriately and recall recent events. No signs of memory loss or confusion.    04/06/2024   11:18 AM  MMSE - Mini Mental State Exam  Not completed: Unable to complete        04/06/2024   11:15 AM 04/01/2023   11:23 AM  6CIT Screen  What Year? 0 points 0 points  What month? 0 points 0 points  What time? 0 points 0 points  Count back from 20 0 points 0 points  Months in reverse 0 points 0 points  Repeat phrase 0 points 0 points  Total Score 0 points 0 points    Immunizations Immunization History  Administered Date(s) Administered   Influenza, Seasonal, Injecte, Preservative Fre 05/18/2023   Influenza,inj,Quad PF,6+ Mos 05/03/2014, 06/22/2017, 04/26/2018, 04/18/2019, 07/03/2021, 07/15/2022   Moderna SARS-COV2 Booster Vaccination 02/03/2021   Moderna Sars-Covid-2 Vaccination 08/07/2020, 09/04/2020   PNEUMOCOCCAL CONJUGATE-20 08/26/2023  Tdap 12/09/2017   Zoster Recombinant(Shingrix) 10/14/2022, 12/23/2022    Screening Tests Health Maintenance  Topic Date Due   Hepatitis B Vaccines 19-59 Average Risk (1 of 3 - 19+ 3-dose series) Never done   COVID-19 Vaccine (3 - Moderna risk series) 03/03/2021   FOOT EXAM  11/27/2022   OPHTHALMOLOGY EXAM  05/06/2023   Influenza Vaccine  02/26/2024   Diabetic kidney evaluation - Urine ACR  05/17/2024   Diabetic kidney evaluation - eGFR measurement  07/30/2024   HEMOGLOBIN A1C  09/08/2024   Medicare Annual Wellness (AWV)  04/06/2025   Cervical Cancer Screening (HPV/Pap Cotest)  04/17/2025   MAMMOGRAM  06/08/2025   Colonoscopy  03/24/2026   DTaP/Tdap/Td (2 - Td or Tdap) 12/10/2027   Pneumococcal Vaccine: 50+ Years  Completed   Hepatitis C Screening  Completed   HIV Screening  Completed   Zoster Vaccines- Shingrix  Completed   HPV VACCINES  Aged Out   Meningococcal B Vaccine  Aged Out    Health Maintenance Items Addressed: Yes Patient is aware of current care gaps.  Patient is due for  the following: Diabetic Eye Exam, Diabetic Foot Exam, Covid, Flu and Hepatitis B Series.  Additional Screening:  Vision Screening: Recommended annual ophthalmology exams for early detection of glaucoma and other disorders of the eye. Is the patient up to date with their annual eye exam?  Yes  Who is the provider or what is the name of the office in which the patient attends annual eye exams? Lonni Gaudy, MD.  Dental Screening: Recommended annual dental exams for proper oral hygiene  Community Resource Referral / Chronic Care Management: CRR required this visit?  No   CCM required this visit?  No   Plan:    I have personally reviewed and noted the following in the patient's chart:   Medical and social history Use of alcohol, tobacco or illicit drugs  Current medications and supplements including opioid prescriptions. Patient is not currently taking opioid prescriptions. Functional ability and status Nutritional status Physical activity Advanced directives List of other physicians Hospitalizations, surgeries, and ER visits in previous 12 months Vitals Screenings to include cognitive, depression, and falls Referrals and appointments  In addition, I have reviewed and discussed with patient certain preventive protocols, quality metrics, and best practice recommendations. A written personalized care plan for preventive services as well as general preventive health recommendations were provided to patient.   Roz LOISE Fuller, LPN   [id number]   After Visit Summary: (MyChart) Due to this being a telephonic visit, the after visit summary with patients personalized plan was offered to patient via MyChart   Notes: Nothing significant to report at this time.

## 2024-04-06 NOTE — Telephone Encounter (Signed)
 Patient stated during her AWV today, that she had started a new medication names Fyavolv  for menopause.  She stated that she is currently having severe headaches, not as much hot flashes, but her irritated mood has increased and people have notices and mentioned it to her.  Should she be worried? Should she try something different or continue this medication.  I advised patient to keep a log of her mood/symptoms during the day and to bring that information with her to her next appointment.  Twanisha Foulk N. Tomie, LPN [facility] Annual Wellness Team Direct Dial: [phone]

## 2024-04-06 NOTE — Patient Instructions (Signed)
 Maureen Reyes,  Thank you for taking the time for your Medicare Wellness Visit. I appreciate your continued commitment to your health goals. Please review the care plan we discussed, and feel free to reach out if I can assist you further.  Medicare recommends these wellness visits once per year to help you and your care team stay ahead of potential health issues. These visits are designed to focus on prevention, allowing your provider to concentrate on managing your acute and chronic conditions during your regular appointments.  Please note that Annual Wellness Visits do not include a physical exam. Some assessments may be limited, especially if the visit was conducted virtually. If needed, we may recommend a separate in-person follow-up with your provider.  Ongoing Care Seeing your primary care provider every 3 to 6 months helps us  monitor your health and provide consistent, personalized care.   Referrals If a referral was made during today's visit and you haven't received any updates within two weeks, please contact the referred provider directly to check on the status.  Recommended Screenings:  Health Maintenance  Topic Date Due   Hepatitis B Vaccine (1 of 3 - 19+ 3-dose series) Never done   COVID-19 Vaccine (3 - Moderna risk series) 03/03/2021   Complete foot exam   11/27/2022   Eye exam for diabetics  05/06/2023   Flu Shot  02/26/2024   Yearly kidney health urinalysis for diabetes  05/17/2024   Yearly kidney function blood test for diabetes  07/30/2024   Hemoglobin A1C  09/08/2024   Medicare Annual Wellness Visit  04/06/2025   Pap with HPV screening  04/17/2025   Mammogram  06/08/2025   Colon Cancer Screening  03/24/2026   DTaP/Tdap/Td vaccine (2 - Td or Tdap) 12/10/2027   Pneumococcal Vaccine for age over 35  Completed   Hepatitis C Screening  Completed   HIV Screening  Completed   Zoster (Shingles) Vaccine  Completed   HPV Vaccine  Aged Out   Meningitis B Vaccine  Aged Out        04/06/2024   11:17 AM  Advanced Directives  Does Patient Have a Medical Advance Directive? No  Would patient like information on creating a medical advance directive? No - Patient declined   Advance Care Planning is important because it: Ensures you receive medical care that aligns with your values, goals, and preferences. Provides guidance to your family and loved ones, reducing the emotional burden of decision-making during critical moments.  Vision: Annual vision screenings are recommended for early detection of glaucoma, cataracts, and diabetic retinopathy. These exams can also reveal signs of chronic conditions such as diabetes and high blood pressure.  Dental: Annual dental screenings help detect early signs of oral cancer, gum disease, and other conditions linked to overall health, including heart disease and diabetes.  Please see the attached documents for additional preventive care recommendations.

## 2024-04-08 ENCOUNTER — Other Ambulatory Visit: Payer: Self-pay

## 2024-04-08 ENCOUNTER — Other Ambulatory Visit (HOSPITAL_COMMUNITY): Payer: Self-pay

## 2024-04-11 NOTE — Telephone Encounter (Signed)
 I called the patient to give her Dr.McLendon's message. Unable to reach the patient, I lvm for her to give us  a call back.

## 2024-04-18 ENCOUNTER — Other Ambulatory Visit: Payer: Self-pay | Admitting: Student

## 2024-04-18 ENCOUNTER — Other Ambulatory Visit (HOSPITAL_COMMUNITY): Payer: Self-pay

## 2024-04-18 ENCOUNTER — Other Ambulatory Visit: Payer: Self-pay

## 2024-04-18 DIAGNOSIS — E119 Type 2 diabetes mellitus without complications: Secondary | ICD-10-CM

## 2024-04-18 MED ORDER — OZEMPIC (0.25 OR 0.5 MG/DOSE) 2 MG/3ML ~~LOC~~ SOPN
0.2500 mg | PEN_INJECTOR | SUBCUTANEOUS | 2 refills | Status: DC
  Filled 2024-04-18: qty 3, 28d supply, fill #0
  Filled 2024-05-09: qty 3, 28d supply, fill #1
  Filled 2024-06-01: qty 3, 28d supply, fill #2

## 2024-04-18 NOTE — Telephone Encounter (Signed)
 Medication sent to pharmacy

## 2024-04-19 NOTE — Progress Notes (Signed)
 Internal Medicine Attending:  I reviewed the AWV findings of the medical professional who conducted the visit. I was present in the office suite and immediately available to provide assistance and direction throughout the time the service was provided.

## 2024-05-09 ENCOUNTER — Other Ambulatory Visit (HOSPITAL_COMMUNITY): Payer: Self-pay

## 2024-05-09 ENCOUNTER — Other Ambulatory Visit: Payer: Self-pay

## 2024-05-20 ENCOUNTER — Other Ambulatory Visit (HOSPITAL_COMMUNITY): Payer: Self-pay

## 2024-05-20 ENCOUNTER — Ambulatory Visit: Payer: Medicare (Managed Care) | Admitting: Student

## 2024-05-20 VITALS — BP 124/77 | HR 52 | Temp 98.4°F | Ht 69.0 in | Wt 206.8 lb

## 2024-05-20 DIAGNOSIS — R936 Abnormal findings on diagnostic imaging of limbs: Secondary | ICD-10-CM

## 2024-05-20 DIAGNOSIS — M79641 Pain in right hand: Secondary | ICD-10-CM | POA: Diagnosis not present

## 2024-05-20 MED ORDER — NAPROXEN 500 MG PO TABS
500.0000 mg | ORAL_TABLET | Freq: Two times a day (BID) | ORAL | 0 refills | Status: AC
Start: 2024-05-20 — End: 2025-05-20
  Filled 2024-05-20: qty 20, 10d supply, fill #0

## 2024-05-20 NOTE — Patient Instructions (Addendum)
 Thank you so much for coming to the clinic today!   I am sending in a stronger pain medication to help I have also put in a referral to the hand specialists.   If you have any questions please feel free to the call the clinic at anytime at [phone]. It was a pleasure seeing you!  Best, Dr. Lugene Beougher

## 2024-05-21 DIAGNOSIS — M79641 Pain in right hand: Secondary | ICD-10-CM | POA: Insufficient documentation

## 2024-05-21 NOTE — Assessment & Plan Note (Signed)
 Patient complaining of right proximal anterior phalangeal joint pain this been going on since September.  She went to the urgent care at that time, an x-ray showed Ill-defined, subtle radiodensity projects over the dorsal aspect of the middle finger proximal interphalangeal joint, which may represent a small avulsion fracture or degenerative osteophyte.  She is continually having pain in the area, and feels as if is not healing up well.  On my exam, there is swelling over the proximal interphalangeal joint of the third digit on the right hand, I do not appreciate any erythema, and it is mildly tender to palpation.  She has tried conservative management with buddy taping and over-the-counter pain relievers however they have not helped.  Etiology currently unclear, imaging does show could be an avulsion fracture which could just be not healing correctly given location of where the injury is.  Questionable osteophyte and arthritic changes of the joint as well.  Will trial short course of naproxen , and send to hand surgery for further evaluation.

## 2024-05-21 NOTE — Progress Notes (Signed)
 CC: Right hand pain  HPI:  [name] is a 53 y.o. female living with a history stated below and presents today for right hand pain. Please see problem based assessment and plan for additional details.  Past Medical History:  Diagnosis Date   Arthritis    Chronic headaches    Colon polyps    Diabetes mellitus without complication (HCC)    HLD (hyperlipidemia)    Hot flashes 02/29/2020   Lumbar facet arthropathy    Neuropathy    Prediabetes    Sciatica of right side    Sciatica of right side 05/04/2014    Current Outpatient Medications on File Prior to Visit  Medication Sig Dispense Refill   rosuvastatin  (CRESTOR ) 20 MG tablet Take 1 tablet (20 mg total) by mouth daily. 90 tablet 2   Accu-Chek Softclix Lancets lancets Use as instructed 100 each 12   Blood Glucose Monitoring Suppl (ACCU-CHEK GUIDE) w/Device KIT Use as directed 1 kit 3   Fiber Adult Gummies 2 g CHEW Chew 2 each by mouth daily.     gabapentin  (NEURONTIN ) 100 MG capsule Take 3 capsules (300 mg total) by mouth at bedtime. 90 capsule 3   glucose blood (GNP TRUE METRIX GLUCOSE STRIPS) test strip Use as instructed to check blood sugar. 100 each 12   glucose blood test strip Use as instructed to check blood sugar. 100 each 12   ibuprofen  (ADVIL ) 600 MG tablet Take 1 tablet (600 mg total) by mouth every 6 (six) hours as needed. 30 tablet 0   Melatonin 10 MG CHEW Chew 1 each by mouth at bedtime.     metFORMIN  (GLUCOPHAGE ) 1000 MG tablet Take 1 tablet (1,000 mg total) by mouth 2 (two) times daily with a meal. (Patient taking differently: Take 1,000 mg by mouth daily with breakfast.) 120 tablet 5   norethindrone -ethinyl estradiol  (FYAVOLV ) 0.5-2.5 MG-MCG tablet Take 1 tablet by mouth daily. 28 tablet 3   oxyCODONE -acetaminophen  (PERCOCET/ROXICET) 5-325 MG tablet Take 1 tablet by mouth at bedtime. (Patient not taking: Reported on 04/06/2024) 14 tablet 0   Semaglutide ,0.25 or 0.5MG /DOS, (OZEMPIC , 0.25 OR 0.5 MG/DOSE,)  2 MG/3ML SOPN Inject 0.25 mg into the skin once a week. Increase after 4 weeks if tolerating well to 0.5mg . 3 mL 2   venlafaxine  XR (EFFEXOR  XR) 37.5 MG 24 hr capsule Take 1 capsule (37.5 mg total) by mouth daily. 30 capsule 2   No current facility-administered medications on file prior to visit.    Family History  Problem Relation Age of Onset   Diabetes Mother    Diabetes Mellitus II Mother    Thyroid  disease Mother    Colon polyps Mother    Diabetes Father    Diabetes Mellitus II Father    Diabetes Maternal Aunt        several aunts and uncles   Heart disease Maternal Aunt    Pancreatic cancer Maternal Aunt    Diabetes Maternal Uncle        several aunts and uncles   Pancreatic cancer Maternal Uncle    Diabetes Paternal Aunt        several aunts and uncles   Diabetes Paternal Uncle        several aunts and uncles   Diabetes Maternal Grandmother    Pancreatic cancer Maternal Grandfather    Diabetes Maternal Grandfather    Pancreatic disease Maternal Grandfather        cancer   Diabetes Paternal Grandmother    Diabetes  Paternal Grandfather    Colon cancer Neg Hx    Rectal cancer Neg Hx    Stomach cancer Neg Hx    Esophageal cancer Neg Hx    Inflammatory bowel disease Neg Hx    Liver disease Neg Hx     Social History   Socioeconomic History   Marital status: Single    Spouse name: Not on file   Number of children: 3   Years of education: Not on file   Highest education level: Some college, no degree  Occupational History   Occupation: Unemployed  Tobacco Use   Smoking status: Former    Current packs/day: 0.00    Average packs/day: 0.3 packs/day for 20.0 years (5.0 ttl pk-yrs)    Types: Cigarettes    Start date: 07/01/2000    Quit date: 07/01/2020    Years since quitting: 3.8   Smokeless tobacco: Never   Tobacco comments:    Stopped last year   Vaping Use   Vaping status: Never Used  Substance and Sexual Activity   Alcohol use: No   Drug use: No   Sexual  activity: Not Currently    Birth control/protection: Surgical  Other Topics Concern   Not on file  Social History Narrative   Lives with someone, (partner)   Social Drivers of Health   Financial Resource Strain: Low Risk  (04/06/2024)   Overall Financial Resource Strain (CARDIA)    Difficulty of Paying Living Expenses: Not very hard  Food Insecurity: No Food Insecurity (04/06/2024)   Hunger Vital Sign    Worried About Running Out of Food in the Last Year: Never true    Ran Out of Food in the Last Year: Never true  Transportation Needs: No Transportation Needs (04/06/2024)   PRAPARE - Administrator, Civil Service (Medical): No    Lack of Transportation (Non-Medical): No  Physical Activity: Sufficiently Active (04/06/2024)   Exercise Vital Sign    Days of Exercise per Week: 5 days    Minutes of Exercise per Session: 30 min  Recent Concern: Physical Activity - Insufficiently Active (03/08/2024)   Exercise Vital Sign    Days of Exercise per Week: 3 days    Minutes of Exercise per Session: 30 min  Stress: No Stress Concern Present (04/06/2024)   [employer] of Occupational Health - Occupational Stress Questionnaire    Feeling of Stress: Not at all  Social Connections: Moderately Integrated (04/06/2024)   Social Connection and Isolation Panel    Frequency of Communication with Friends and Family: More than three times a week    Frequency of Social Gatherings with Friends and Family: Once a week    Attends Religious Services: 1 to 4 times per year    Active Member of [employer] or Organizations: No    Attends Banker Meetings: Never    Marital Status: Living with partner  Intimate Partner Violence: Not At Risk (04/06/2024)   Humiliation, Afraid, Rape, and Kick questionnaire    Fear of Current or Ex-Partner: No    Emotionally Abused: No    Physically Abused: No    Sexually Abused: No    Review of Systems: ROS negative except for what is noted on the  assessment and plan.  Vitals:   05/20/24 1059  BP: 124/77  Pulse: (!) 52  Temp: 98.4 F (36.9 C)  TempSrc: Oral  SpO2: 100%  Weight: 206 lb 12.8 oz (93.8 kg)  Height: 5' 9 (1.753 m)  Physical Exam: Constitutional: well-appearing female  in no acute distress Cardiovascular: regular rate and rhythm, no m/r/g Pulmonary/Chest: normal work of breathing on room air, lungs clear to auscultation bilaterally MSK: normal bulk and tone, mild tenderness to palpation of 3rd digit on right hand over PIP, swollen, non-erythematous, has full range of motion of hand, no strength lost   Assessment & Plan:   Right hand pain Patient complaining of right proximal anterior phalangeal joint pain this been going on since September.  She went to the urgent care at that time, an x-ray showed Ill-defined, subtle radiodensity projects over the dorsal aspect of the middle finger proximal interphalangeal joint, which may represent a small avulsion fracture or degenerative osteophyte.  She is continually having pain in the area, and feels as if is not healing up well.  On my exam, there is swelling over the proximal interphalangeal joint of the third digit on the right hand, I do not appreciate any erythema, and it is mildly tender to palpation.  She has tried conservative management with buddy taping and over-the-counter pain relievers however they have not helped.  Etiology currently unclear, imaging does show could be an avulsion fracture which could just be not healing correctly given location of where the injury is.  Questionable osteophyte and arthritic changes of the joint as well.  Will trial short course of naproxen , and send to hand surgery for further evaluation.   Patient discussed with Dr. Trudy Dirks Maureen Reyes, M.D. [facility] Internal Medicine, PGY-3 Pager: [phone] Date 05/21/2024 Time 9:14 PM

## 2024-05-26 ENCOUNTER — Ambulatory Visit: Admitting: Orthopedic Surgery

## 2024-05-26 DIAGNOSIS — M79641 Pain in right hand: Secondary | ICD-10-CM

## 2024-05-26 NOTE — Progress Notes (Signed)
 Maureen Reyes - 53 y.o. female MRN [id number]  Date of birth: Nov 07, 1970  Office Visit Note: Visit Date: 05/26/2024 PCP: Nooruddin, Saad, MD Referred by: Maureen Mliss Dragon, MD  Subjective: No chief complaint on file.  HPI: Maureen Reyes is a pleasant 53 y.o. female who presents today for ongoing pain and swelling of the right long finger PIP that is been present now for roughly 6 weeks.  Denies any recent injury or inciting incident.  She does have a history of remote gout, is not on any current medical management.  She was seen recently in the urgent care setting, was placed on naproxen  with mild relief of symptoms.  States that her diet has changed recently as well away from red meat which has helped her recent symptoms, pain and swelling is improved as is her range of motion.  Pertinent ROS were reviewed with the patient and found to be negative unless otherwise specified above in HPI.   Visit Reason: right hand - long finger Duration of symptoms: September Hand dominance: right Occupation: unemployed Diabetic: Yes / 5.9 Smoking: No Heart/Lung History: none Blood Thinners:  none  Prior Testing/EMG: xrays september Injections (Date): none Treatments: none Prior Surgery: none    Assessment & Plan: Visit Diagnoses:  1. Pain in right hand     Plan: Based on her clinical history and examination today, this does appear to be consistent with a gouty flare to the right long finger PIP.  I explained to her that underlying gout can flareup in multiple digits.  We did discuss lifestyle changes that can help with gouty flareups including diet, she did acknowledge that her recent change in diet did help her.  We also discussed the possibility of medical management moving forward should her flareups become more frequent in nature.  I mentioned to her that I will send a note to her PCP moving forward to discuss with them if we need to get uric acid levels on her moving forward and  potentially discuss medical management of gouty flareups should they become more frequent.  She expressed understanding, will return to me as needed moving forward.  I did show her compression wrapping of the PIP today with Coban, she expressed understanding.  Follow-up: No follow-ups on file.   Meds & Orders: No orders of the defined types were placed in this encounter.  No orders of the defined types were placed in this encounter.    Procedures: No procedures performed      Clinical History: No specialty comments available.  She reports that she quit smoking about 3 years ago. Her smoking use included cigarettes. She started smoking about 23 years ago. She has a 5 pack-year smoking history. She has never used smokeless tobacco.  Recent Labs    08/26/23 1039 03/08/24 1115  HGBA1C 5.9* 5.9*    Objective:   Vital Signs: There were no vitals taken for this visit.  Physical Exam  Gen: Well-appearing, in no acute distress; non-toxic CV: Regular Rate. Well-perfused. Warm.  Resp: Breathing unlabored on room air; no wheezing. Psych: Fluid speech in conversation; appropriate affect; normal thought process  Ortho Exam Right hand: - Long finger with notable swelling, range of motion is limited secondary to pain - Able to perform near composite fist on examination today, improved passively, ring finger nearly to the [facility], approximately 0.5 cm - PIP with moderate pain with the palpation, no instability, sensation is intact distally, digit remains warm well-perfused   Imaging: No  results found. Prior x-rays of the hand were independently reviewed by myself  Past Medical/Family/Surgical/Social History: Medications & Allergies reviewed per EMR, new medications updated. Patient Active Problem List   Diagnosis Date Noted   Right hand pain 05/21/2024   Symptomatic menopausal or female climacteric states 03/08/2024   Chronic dental pain 08/27/2023   Family history of pancreatic cancer  08/04/2023   Screening procedure 08/04/2023   Genetic testing 04/20/2023   Colon polyps    Mononeuropathy 10/14/2022   Positive FIT (fecal immunochemical test) 07/24/2021   Controlled type 2 diabetes mellitus (HCC) 07/03/2021   Hypercholesteremia 07/03/2021   Chronic lower back pain 11/26/2020   Subclinical hypothyroidism 04/19/2019   Ganglion cyst 04/19/2019   Obesity (BMI 30-39.9) 12/09/2017   Atopic dermatitis 02/18/2017   White coat syndrome with high blood pressure but without hypertension 02/18/2017   Polyneuropathy 11/28/2014   Lumbar facet arthropathy 05/04/2014   Healthcare maintenance 05/04/2014   Tobacco use disorder, mild, in sustained remission 05/04/2014   Past Medical History:  Diagnosis Date   Arthritis    Chronic headaches    Colon polyps    Diabetes mellitus without complication (HCC)    HLD (hyperlipidemia)    Hot flashes 02/29/2020   Lumbar facet arthropathy    Neuropathy    Prediabetes    Sciatica of right side    Sciatica of right side 05/04/2014   Family History  Problem Relation Age of Onset   Diabetes Mother    Diabetes Mellitus II Mother    Thyroid  disease Mother    Colon polyps Mother    Diabetes Father    Diabetes Mellitus II Father    Diabetes Maternal Aunt        several aunts and uncles   Heart disease Maternal Aunt    Pancreatic cancer Maternal Aunt    Diabetes Maternal Uncle        several aunts and uncles   Pancreatic cancer Maternal Uncle    Diabetes Paternal Aunt        several aunts and uncles   Diabetes Paternal Uncle        several aunts and uncles   Diabetes Maternal Grandmother    Pancreatic cancer Maternal Grandfather    Diabetes Maternal Grandfather    Pancreatic disease Maternal Grandfather        cancer   Diabetes Paternal Grandmother    Diabetes Paternal Grandfather    Colon cancer Neg Hx    Rectal cancer Neg Hx    Stomach cancer Neg Hx    Esophageal cancer Neg Hx    Inflammatory bowel disease Neg Hx     Liver disease Neg Hx    Past Surgical History:  Procedure Laterality Date   COLONOSCOPY  2023   [place]   TUBAL LIGATION  1990 pt reported   Social History   Occupational History   Occupation: Unemployed  Tobacco Use   Smoking status: Former    Current packs/day: 0.00    Average packs/day: 0.3 packs/day for 20.0 years (5.0 ttl pk-yrs)    Types: Cigarettes    Start date: 07/01/2000    Quit date: 07/01/2020    Years since quitting: 3.9   Smokeless tobacco: Never   Tobacco comments:    Stopped last year   Vaping Use   Vaping status: Never Used  Substance and Sexual Activity   Alcohol use: No   Drug use: No   Sexual activity: Not Currently    Birth control/protection: Surgical  Deletha Jaffee Afton Alderton, M.D. [facility], Hand Surgery

## 2024-05-30 ENCOUNTER — Encounter: Payer: Self-pay | Admitting: Radiology

## 2024-06-01 ENCOUNTER — Other Ambulatory Visit: Payer: Self-pay

## 2024-06-01 ENCOUNTER — Ambulatory Visit (INDEPENDENT_AMBULATORY_CARE_PROVIDER_SITE_OTHER): Payer: Medicare (Managed Care) | Admitting: *Deleted

## 2024-06-01 ENCOUNTER — Other Ambulatory Visit: Payer: Self-pay | Admitting: Student

## 2024-06-01 ENCOUNTER — Telehealth: Payer: Self-pay | Admitting: *Deleted

## 2024-06-01 ENCOUNTER — Other Ambulatory Visit (HOSPITAL_COMMUNITY): Payer: Self-pay

## 2024-06-01 ENCOUNTER — Telehealth: Payer: Self-pay | Admitting: Orthopedic Surgery

## 2024-06-01 DIAGNOSIS — Z23 Encounter for immunization: Secondary | ICD-10-CM | POA: Diagnosis not present

## 2024-06-01 DIAGNOSIS — M79641 Pain in right hand: Secondary | ICD-10-CM

## 2024-06-01 NOTE — Telephone Encounter (Signed)
 Copied from CRM [id number]. Topic: Clinical - Request for Lab/Test Order >> Jun 01, 2024 12:38 PM Farrel B wrote: Reason for CRM: patient [name] stated she was sent to a specialist recently and was informed by the specialist she needed to speak with her provider about having labs done to test her Uric Acid levels. Please advise patient at [phone]

## 2024-06-01 NOTE — Telephone Encounter (Signed)
Order was placed by PCP

## 2024-06-01 NOTE — Telephone Encounter (Signed)
 Copied from CRM [id number]. Topic: Appointments - Appointment Scheduling >> Jun 01, 2024 12:32 PM Suzette B wrote: Patient is calling to schedule an appointment. Refer to attachments for appointment information.

## 2024-06-01 NOTE — Telephone Encounter (Signed)
 Patient called and said that Ans wanted her to get her Uric Acid levels checked by her primary care but they said they need his order in order to do so. [phone]

## 2024-06-01 NOTE — Progress Notes (Signed)
 Internal Medicine Clinic Attending  Case discussed with the resident at the time of the visit.  We reviewed the resident's history and exam and pertinent patient test results.  I agree with the assessment, diagnosis, and plan of care documented in the resident's note.

## 2024-06-01 NOTE — Telephone Encounter (Signed)
 Called pt who stated she went to the hand doctor who stated to have her doctor test her Uric Acid level. I informed her we do not have orders nor office notes.

## 2024-06-02 ENCOUNTER — Other Ambulatory Visit (HOSPITAL_COMMUNITY): Payer: Self-pay

## 2024-06-02 ENCOUNTER — Other Ambulatory Visit: Payer: Self-pay | Admitting: Student

## 2024-06-02 DIAGNOSIS — N951 Menopausal and female climacteric states: Secondary | ICD-10-CM

## 2024-06-02 LAB — URIC ACID: Uric Acid: 3.1 mg/dL (ref 3.0–7.2)

## 2024-06-02 MED ORDER — VENLAFAXINE HCL ER 37.5 MG PO CP24
37.5000 mg | ORAL_CAPSULE | Freq: Every day | ORAL | 2 refills | Status: AC
  Filled 2024-06-02: qty 30, 30d supply, fill #0
  Filled 2024-07-04: qty 30, 30d supply, fill #1
  Filled 2024-09-01: qty 30, 30d supply, fill #2

## 2024-06-15 ENCOUNTER — Telehealth: Payer: Medicare (Managed Care) | Admitting: Physician Assistant

## 2024-06-15 DIAGNOSIS — M79641 Pain in right hand: Secondary | ICD-10-CM

## 2024-06-16 ENCOUNTER — Other Ambulatory Visit: Payer: Self-pay

## 2024-06-16 ENCOUNTER — Other Ambulatory Visit (HOSPITAL_COMMUNITY): Payer: Self-pay

## 2024-06-16 ENCOUNTER — Other Ambulatory Visit: Payer: Self-pay | Admitting: Student

## 2024-06-16 DIAGNOSIS — E782 Mixed hyperlipidemia: Secondary | ICD-10-CM

## 2024-06-16 MED ORDER — ROSUVASTATIN CALCIUM 20 MG PO TABS
20.0000 mg | ORAL_TABLET | Freq: Every day | ORAL | 2 refills | Status: AC
  Filled 2024-06-16 – 2024-09-01 (×2): qty 90, 90d supply, fill #0

## 2024-06-16 NOTE — Progress Notes (Signed)
  Because of ongoing issue needing further workup and chronic management, I feel your condition warrants further evaluation and I recommend that you follow-up with your PCP or Orthopedist.   NOTE: There will be NO CHARGE for this E-Visit   If you are having a true medical emergency, please call 911.     For an urgent face to face visit, [place] has multiple urgent care centers for your convenience.  Click the link below for the full list of locations and hours, walk-in wait times, appointment scheduling options and driving directions:  Urgent Care - [place], [place], [place], [place], [place], [place]  [place]     Your MyChart E-visit questionnaire answers were reviewed by a board certified advanced clinical practitioner to complete your personal care plan based on your specific symptoms.    Thank you for using e-Visits.

## 2024-06-16 NOTE — Telephone Encounter (Signed)
 Medication sent to pharmacy

## 2024-06-20 ENCOUNTER — Other Ambulatory Visit: Payer: Self-pay

## 2024-06-21 ENCOUNTER — Other Ambulatory Visit (HOSPITAL_COMMUNITY): Payer: Self-pay

## 2024-07-03 ENCOUNTER — Other Ambulatory Visit: Payer: Self-pay | Admitting: Student

## 2024-07-03 DIAGNOSIS — N951 Menopausal and female climacteric states: Secondary | ICD-10-CM

## 2024-07-04 ENCOUNTER — Other Ambulatory Visit: Payer: Self-pay | Admitting: Student

## 2024-07-04 ENCOUNTER — Other Ambulatory Visit: Payer: Self-pay

## 2024-07-04 ENCOUNTER — Other Ambulatory Visit (HOSPITAL_COMMUNITY): Payer: Self-pay

## 2024-07-04 DIAGNOSIS — G6289 Other specified polyneuropathies: Secondary | ICD-10-CM

## 2024-07-06 ENCOUNTER — Other Ambulatory Visit (HOSPITAL_COMMUNITY): Payer: Self-pay

## 2024-07-07 ENCOUNTER — Other Ambulatory Visit (HOSPITAL_COMMUNITY): Payer: Self-pay

## 2024-07-07 MED ORDER — NORETHINDRONE-ETH ESTRADIOL 0.5-2.5 MG-MCG PO TABS
1.0000 | ORAL_TABLET | Freq: Every day | ORAL | 3 refills | Status: AC
  Filled 2024-07-07: qty 28, 28d supply, fill #0
  Filled 2024-08-01: qty 28, 28d supply, fill #1
  Filled 2024-09-01: qty 28, 28d supply, fill #2

## 2024-07-07 MED ORDER — GABAPENTIN 100 MG PO CAPS
300.0000 mg | ORAL_CAPSULE | Freq: Every day | ORAL | 3 refills | Status: AC
  Filled 2024-07-07 – 2024-08-01 (×3): qty 90, 30d supply, fill #0
  Filled 2024-09-01: qty 90, 30d supply, fill #1

## 2024-07-18 ENCOUNTER — Other Ambulatory Visit: Payer: Self-pay

## 2024-07-19 ENCOUNTER — Other Ambulatory Visit (HOSPITAL_COMMUNITY): Payer: Self-pay

## 2024-07-29 ENCOUNTER — Other Ambulatory Visit (HOSPITAL_COMMUNITY): Payer: Self-pay

## 2024-08-01 ENCOUNTER — Other Ambulatory Visit: Payer: Self-pay

## 2024-08-01 ENCOUNTER — Other Ambulatory Visit (HOSPITAL_COMMUNITY): Payer: Self-pay

## 2024-08-01 DIAGNOSIS — E119 Type 2 diabetes mellitus without complications: Secondary | ICD-10-CM

## 2024-08-01 MED ORDER — METFORMIN HCL 1000 MG PO TABS
1000.0000 mg | ORAL_TABLET | Freq: Two times a day (BID) | ORAL | 5 refills | Status: AC
  Filled 2024-08-01 – 2024-09-02 (×2): qty 120, 60d supply, fill #0

## 2024-08-01 MED ORDER — OZEMPIC (0.25 OR 0.5 MG/DOSE) 2 MG/3ML ~~LOC~~ SOPN
0.2500 mg | PEN_INJECTOR | SUBCUTANEOUS | 2 refills | Status: AC
  Filled 2024-08-01: qty 3, 42d supply, fill #0

## 2024-08-17 ENCOUNTER — Telehealth: Payer: Self-pay

## 2024-08-17 NOTE — Telephone Encounter (Signed)
 Maureen Reyes (Key: [id number]) Ozempic  (0.25 or 0.5 MG/DOSE) 2MG /3ML pen-injectors Form [employer] Electronic Prior Authorization Form (2017 NCPDP) Created 6 hours ago Sent to Plan 16 minutes ago Plan Response 15 minutes ago Submit Clinical Questions 12 minutes ago Determination Favorable 10 minutes ago Message from Plan Request Reference Number: [id number]. OZEMPIC  INJ 2MG /3ML is approved through 08/17/2025. For further questions, call [employer] at [phone].SABRA Authorization Expiration Date: August 17, 2025.

## 2024-08-17 NOTE — Telephone Encounter (Signed)
 Prior Authorization for patient (Ozempic  (0.25 or 0.5 MG/DOSE) 2MG /3ML pen-injectors) came through on cover my meds was submitted with last office notes and labs awaiting approval or denial.  XZB:[id number]

## 2024-08-22 ENCOUNTER — Other Ambulatory Visit: Payer: Self-pay

## 2024-09-01 ENCOUNTER — Encounter: Payer: Self-pay | Admitting: Pharmacist

## 2024-09-01 ENCOUNTER — Other Ambulatory Visit (HOSPITAL_COMMUNITY): Payer: Self-pay

## 2024-09-02 ENCOUNTER — Other Ambulatory Visit: Payer: Self-pay

## 2024-09-02 ENCOUNTER — Other Ambulatory Visit (HOSPITAL_COMMUNITY): Payer: Self-pay

## 2025-04-12 ENCOUNTER — Ambulatory Visit
